# Patient Record
Sex: Female | Born: 1935 | Race: White | Hispanic: No | Marital: Single | Smoking: Former smoker
Health system: Southern US, Community
[De-identification: ages and names within clinical notes are randomized; demographics above are authoritative.]

## PROBLEM LIST (undated history)

## (undated) DIAGNOSIS — C50919 Malignant neoplasm of unspecified site of unspecified female breast: Secondary | ICD-10-CM

## (undated) DIAGNOSIS — I480 Paroxysmal atrial fibrillation: Secondary | ICD-10-CM

## (undated) DIAGNOSIS — S72141A Displaced intertrochanteric fracture of right femur, initial encounter for closed fracture: Secondary | ICD-10-CM

## (undated) DIAGNOSIS — H25019 Cortical age-related cataract, unspecified eye: Secondary | ICD-10-CM

## (undated) DIAGNOSIS — I10 Essential (primary) hypertension: Secondary | ICD-10-CM

## (undated) DIAGNOSIS — Z973 Presence of spectacles and contact lenses: Secondary | ICD-10-CM

## (undated) DIAGNOSIS — M810 Age-related osteoporosis without current pathological fracture: Secondary | ICD-10-CM

## (undated) HISTORY — PX: SIGMOIDOSCOPY: SUR1295

## (undated) HISTORY — PX: DILATION AND CURETTAGE OF UTERUS: SHX78

## (undated) HISTORY — PX: APPENDECTOMY: SHX54

## (undated) HISTORY — PX: TONSILLECTOMY: SUR1361

---

## 1979-09-13 HISTORY — PX: MASTECTOMY: SHX3

## 1998-04-14 ENCOUNTER — Other Ambulatory Visit: Admission: RE | Admit: 1998-04-14 | Discharge: 1998-04-14 | Payer: Self-pay | Admitting: Obstetrics & Gynecology

## 1998-04-15 ENCOUNTER — Other Ambulatory Visit: Admission: RE | Admit: 1998-04-15 | Discharge: 1998-04-15 | Payer: Self-pay | Admitting: Obstetrics & Gynecology

## 1999-04-19 ENCOUNTER — Other Ambulatory Visit: Admission: RE | Admit: 1999-04-19 | Discharge: 1999-04-19 | Payer: Self-pay | Admitting: Obstetrics & Gynecology

## 2000-05-02 ENCOUNTER — Other Ambulatory Visit: Admission: RE | Admit: 2000-05-02 | Discharge: 2000-05-02 | Payer: Self-pay | Admitting: Obstetrics & Gynecology

## 2001-05-09 ENCOUNTER — Other Ambulatory Visit: Admission: RE | Admit: 2001-05-09 | Discharge: 2001-05-09 | Payer: Self-pay | Admitting: Obstetrics & Gynecology

## 2004-09-12 HISTORY — PX: BREAST LUMPECTOMY WITH AXILLARY LYMPH NODE BIOPSY: SHX5593

## 2005-02-26 ENCOUNTER — Encounter: Admission: RE | Admit: 2005-02-26 | Discharge: 2005-02-26 | Payer: Self-pay | Admitting: General Surgery

## 2005-03-03 ENCOUNTER — Encounter: Admission: RE | Admit: 2005-03-03 | Discharge: 2005-03-03 | Payer: Self-pay | Admitting: General Surgery

## 2005-03-07 ENCOUNTER — Ambulatory Visit (HOSPITAL_BASED_OUTPATIENT_CLINIC_OR_DEPARTMENT_OTHER): Admission: RE | Admit: 2005-03-07 | Discharge: 2005-03-08 | Payer: Self-pay | Admitting: General Surgery

## 2005-03-07 ENCOUNTER — Ambulatory Visit (HOSPITAL_COMMUNITY): Admission: RE | Admit: 2005-03-07 | Discharge: 2005-03-07 | Payer: Self-pay | Admitting: General Surgery

## 2005-03-07 ENCOUNTER — Encounter (INDEPENDENT_AMBULATORY_CARE_PROVIDER_SITE_OTHER): Payer: Self-pay | Admitting: *Deleted

## 2005-03-16 ENCOUNTER — Ambulatory Visit: Payer: Self-pay | Admitting: Oncology

## 2005-04-04 ENCOUNTER — Ambulatory Visit: Admission: RE | Admit: 2005-04-04 | Discharge: 2005-06-10 | Payer: Self-pay | Admitting: Radiation Oncology

## 2005-04-06 ENCOUNTER — Ambulatory Visit (HOSPITAL_COMMUNITY): Admission: RE | Admit: 2005-04-06 | Discharge: 2005-04-06 | Payer: Self-pay | Admitting: Oncology

## 2005-06-03 ENCOUNTER — Ambulatory Visit: Payer: Self-pay | Admitting: Oncology

## 2005-07-22 ENCOUNTER — Ambulatory Visit: Payer: Self-pay | Admitting: Oncology

## 2005-10-25 ENCOUNTER — Ambulatory Visit: Payer: Self-pay | Admitting: Oncology

## 2006-02-23 ENCOUNTER — Ambulatory Visit: Payer: Self-pay | Admitting: Oncology

## 2006-02-27 LAB — CBC WITH DIFFERENTIAL/PLATELET
BASO%: 2 % (ref 0.0–2.0)
Basophils Absolute: 0.1 10*3/uL (ref 0.0–0.1)
EOS%: 2.1 % (ref 0.0–7.0)
HGB: 12.5 g/dL (ref 11.6–15.9)
MCH: 32.5 pg (ref 26.0–34.0)
MCV: 92.7 fL (ref 81.0–101.0)
MONO%: 9.4 % (ref 0.0–13.0)
RBC: 3.85 10*6/uL (ref 3.70–5.32)
RDW: 12.5 % (ref 11.3–14.5)
lymph#: 1.6 10*3/uL (ref 0.9–3.3)

## 2006-02-27 LAB — COMPREHENSIVE METABOLIC PANEL
ALT: 10 U/L (ref 0–40)
AST: 18 U/L (ref 0–37)
Albumin: 3.6 g/dL (ref 3.5–5.2)
Alkaline Phosphatase: 75 U/L (ref 39–117)
Potassium: 3.7 mEq/L (ref 3.5–5.3)
Sodium: 129 mEq/L — ABNORMAL LOW (ref 135–145)
Total Bilirubin: 0.5 mg/dL (ref 0.3–1.2)
Total Protein: 6.1 g/dL (ref 6.0–8.3)

## 2006-02-27 LAB — CANCER ANTIGEN 27.29: CA 27.29: 18 U/mL (ref 0–39)

## 2006-08-17 ENCOUNTER — Ambulatory Visit: Payer: Self-pay | Admitting: Oncology

## 2006-08-21 LAB — CBC WITH DIFFERENTIAL/PLATELET
Basophils Absolute: 0.1 10*3/uL (ref 0.0–0.1)
Eosinophils Absolute: 0.2 10*3/uL (ref 0.0–0.5)
HGB: 12.5 g/dL (ref 11.6–15.9)
LYMPH%: 22 % (ref 14.0–48.0)
MCV: 93.3 fL (ref 81.0–101.0)
MONO%: 10.6 % (ref 0.0–13.0)
NEUT#: 3.9 10*3/uL (ref 1.5–6.5)
Platelets: 322 10*3/uL (ref 145–400)
RBC: 3.9 10*6/uL (ref 3.70–5.32)

## 2006-08-21 LAB — COMPREHENSIVE METABOLIC PANEL
Alkaline Phosphatase: 76 U/L (ref 39–117)
BUN: 15 mg/dL (ref 6–23)
Glucose, Bld: 79 mg/dL (ref 70–99)
Total Bilirubin: 0.5 mg/dL (ref 0.3–1.2)

## 2006-08-21 LAB — CANCER ANTIGEN 27.29: CA 27.29: 22 U/mL (ref 0–39)

## 2007-02-21 ENCOUNTER — Ambulatory Visit: Payer: Self-pay | Admitting: Oncology

## 2007-02-23 LAB — CBC WITH DIFFERENTIAL/PLATELET
BASO%: 0.6 % (ref 0.0–2.0)
HCT: 36.4 % (ref 34.8–46.6)
LYMPH%: 19.6 % (ref 14.0–48.0)
MCHC: 35.1 g/dL (ref 32.0–36.0)
MCV: 92.8 fL (ref 81.0–101.0)
MONO#: 0.6 10*3/uL (ref 0.1–0.9)
MONO%: 9.5 % (ref 0.0–13.0)
NEUT%: 68.4 % (ref 39.6–76.8)
Platelets: 227 10*3/uL (ref 145–400)
RBC: 3.93 10*6/uL (ref 3.70–5.32)
WBC: 6.1 10*3/uL (ref 3.9–10.0)

## 2007-02-23 LAB — COMPREHENSIVE METABOLIC PANEL
ALT: 11 U/L (ref 0–35)
BUN: 14 mg/dL (ref 6–23)
CO2: 26 mEq/L (ref 19–32)
Calcium: 8.8 mg/dL (ref 8.4–10.5)
Creatinine, Ser: 0.76 mg/dL (ref 0.40–1.20)
Total Bilirubin: 0.5 mg/dL (ref 0.3–1.2)

## 2007-02-23 LAB — LACTATE DEHYDROGENASE: LDH: 163 U/L (ref 94–250)

## 2007-08-22 ENCOUNTER — Ambulatory Visit: Payer: Self-pay | Admitting: Oncology

## 2007-08-24 LAB — COMPREHENSIVE METABOLIC PANEL
Albumin: 3.5 g/dL (ref 3.5–5.2)
Alkaline Phosphatase: 49 U/L (ref 39–117)
BUN: 18 mg/dL (ref 6–23)
CO2: 26 mEq/L (ref 19–32)
Calcium: 8.6 mg/dL (ref 8.4–10.5)
Chloride: 100 mEq/L (ref 96–112)
Glucose, Bld: 69 mg/dL — ABNORMAL LOW (ref 70–99)
Potassium: 3.9 mEq/L (ref 3.5–5.3)
Sodium: 137 mEq/L (ref 135–145)
Total Protein: 6.5 g/dL (ref 6.0–8.3)

## 2007-08-24 LAB — CBC WITH DIFFERENTIAL/PLATELET
Basophils Absolute: 0 10*3/uL (ref 0.0–0.1)
Eosinophils Absolute: 0.1 10*3/uL (ref 0.0–0.5)
HGB: 12.3 g/dL (ref 11.6–15.9)
MCV: 93.6 fL (ref 81.0–101.0)
MONO#: 0.5 10*3/uL (ref 0.1–0.9)
MONO%: 9.2 % (ref 0.0–13.0)
NEUT#: 3.7 10*3/uL (ref 1.5–6.5)
RBC: 3.77 10*6/uL (ref 3.70–5.32)
RDW: 12.2 % (ref 11.3–14.5)
WBC: 5.9 10*3/uL (ref 3.9–10.0)
lymph#: 1.5 10*3/uL (ref 0.9–3.3)

## 2007-08-28 LAB — VITAMIN D PNL(25-HYDRXY+1,25-DIHY)-BLD
Vit D, 1,25-Dihydroxy: 21 pg/mL (ref 6–62)
Vit D, 25-Hydroxy: 31 ng/mL (ref 30–89)

## 2008-02-19 ENCOUNTER — Ambulatory Visit: Payer: Self-pay | Admitting: Oncology

## 2008-02-22 LAB — CBC WITH DIFFERENTIAL/PLATELET
Basophils Absolute: 0 10*3/uL (ref 0.0–0.1)
Eosinophils Absolute: 0.1 10*3/uL (ref 0.0–0.5)
HCT: 37.2 % (ref 34.8–46.6)
HGB: 13 g/dL (ref 11.6–15.9)
MCV: 91.4 fL (ref 81.0–101.0)
MONO%: 9.8 % (ref 0.0–13.0)
NEUT#: 3.7 10*3/uL (ref 1.5–6.5)
NEUT%: 61.9 % (ref 39.6–76.8)
Platelets: 217 10*3/uL (ref 145–400)
RDW: 12.2 % (ref 11.3–14.5)

## 2008-02-25 LAB — COMPREHENSIVE METABOLIC PANEL
ALT: 12 U/L (ref 0–35)
AST: 18 U/L (ref 0–37)
Albumin: 3.6 g/dL (ref 3.5–5.2)
Creatinine, Ser: 0.74 mg/dL (ref 0.40–1.20)
Potassium: 4 mEq/L (ref 3.5–5.3)
Sodium: 137 mEq/L (ref 135–145)
Total Protein: 6.5 g/dL (ref 6.0–8.3)

## 2008-02-25 LAB — LACTATE DEHYDROGENASE: LDH: 167 U/L (ref 94–250)

## 2008-08-29 ENCOUNTER — Ambulatory Visit: Payer: Self-pay | Admitting: Oncology

## 2008-09-02 LAB — CBC WITH DIFFERENTIAL/PLATELET
BASO%: 0.6 % (ref 0.0–2.0)
Basophils Absolute: 0 10*3/uL (ref 0.0–0.1)
Eosinophils Absolute: 0.1 10*3/uL (ref 0.0–0.5)
MCH: 32.4 pg (ref 26.0–34.0)
MCHC: 34.6 g/dL (ref 32.0–36.0)
NEUT#: 4.2 10*3/uL (ref 1.5–6.5)
NEUT%: 66.6 % (ref 39.6–76.8)
RDW: 12.6 % (ref 11.3–14.5)
lymph#: 1.4 10*3/uL (ref 0.9–3.3)

## 2008-09-03 LAB — LACTATE DEHYDROGENASE: LDH: 158 U/L (ref 94–250)

## 2008-09-03 LAB — COMPREHENSIVE METABOLIC PANEL
BUN: 21 mg/dL (ref 6–23)
Chloride: 102 mEq/L (ref 96–112)
Creatinine, Ser: 0.86 mg/dL (ref 0.40–1.20)
Potassium: 4.1 mEq/L (ref 3.5–5.3)
Sodium: 139 mEq/L (ref 135–145)

## 2008-09-03 LAB — VITAMIN D 25 HYDROXY (VIT D DEFICIENCY, FRACTURES): Vit D, 25-Hydroxy: 59 ng/mL (ref 30–89)

## 2008-09-03 LAB — CANCER ANTIGEN 27.29: CA 27.29: 19 U/mL (ref 0–39)

## 2009-03-30 ENCOUNTER — Ambulatory Visit: Payer: Self-pay | Admitting: Oncology

## 2009-04-01 LAB — CBC WITH DIFFERENTIAL/PLATELET
BASO%: 0.7 % (ref 0.0–2.0)
Basophils Absolute: 0 10*3/uL (ref 0.0–0.1)
Eosinophils Absolute: 0.1 10*3/uL (ref 0.0–0.5)
MCH: 32.4 pg (ref 25.1–34.0)
MCHC: 34.7 g/dL (ref 31.5–36.0)
MONO#: 0.6 10*3/uL (ref 0.1–0.9)
MONO%: 8.5 % (ref 0.0–14.0)
Platelets: 226 10*3/uL (ref 145–400)
RBC: 4.07 10*6/uL (ref 3.70–5.45)
WBC: 7 10*3/uL (ref 3.9–10.3)

## 2009-04-02 LAB — COMPREHENSIVE METABOLIC PANEL
ALT: 12 U/L (ref 0–35)
Albumin: 3.5 g/dL (ref 3.5–5.2)
BUN: 15 mg/dL (ref 6–23)
Calcium: 8.8 mg/dL (ref 8.4–10.5)
Glucose, Bld: 98 mg/dL (ref 70–99)
Total Protein: 6.3 g/dL (ref 6.0–8.3)

## 2009-04-02 LAB — LACTATE DEHYDROGENASE: LDH: 167 U/L (ref 94–250)

## 2009-04-02 LAB — CANCER ANTIGEN 27.29: CA 27.29: 25 U/mL (ref 0–39)

## 2010-04-07 ENCOUNTER — Ambulatory Visit: Payer: Self-pay | Admitting: Oncology

## 2010-04-12 LAB — COMPREHENSIVE METABOLIC PANEL
ALT: 12 U/L (ref 0–35)
AST: 18 U/L (ref 0–37)
Alkaline Phosphatase: 77 U/L (ref 39–117)
BUN: 11 mg/dL (ref 6–23)
CO2: 24 mEq/L (ref 19–32)
Chloride: 96 mEq/L (ref 96–112)
Potassium: 3.9 mEq/L (ref 3.5–5.3)

## 2010-04-12 LAB — CBC WITH DIFFERENTIAL/PLATELET
Basophils Absolute: 0 10*3/uL (ref 0.0–0.1)
EOS%: 1.2 % (ref 0.0–7.0)
Eosinophils Absolute: 0.1 10*3/uL (ref 0.0–0.5)
MCHC: 34.9 g/dL (ref 31.5–36.0)
MCV: 93.1 fL (ref 79.5–101.0)
MONO#: 0.6 10*3/uL (ref 0.1–0.9)
NEUT%: 67.5 % (ref 38.4–76.8)
RBC: 4.1 10*6/uL (ref 3.70–5.45)
RDW: 12.8 % (ref 11.2–14.5)
WBC: 7.6 10*3/uL (ref 3.9–10.3)
lymph#: 1.7 10*3/uL (ref 0.9–3.3)

## 2010-10-04 ENCOUNTER — Emergency Department (HOSPITAL_COMMUNITY)
Admission: EM | Admit: 2010-10-04 | Discharge: 2010-10-04 | Payer: Self-pay | Source: Home / Self Care | Admitting: Emergency Medicine

## 2011-01-28 NOTE — Op Note (Signed)
[name], Madeline Booth               ACCOUNT NO.:  [id number]   MEDICAL RECORD NO.:  [id number]          PATIENT TYPE:  AMB   LOCATION:  DSC                          FACILITY:  [facility]   PHYSICIAN:  Rose Phi. Maple Hudson, M.D.   DATE OF BIRTH:  04/19/1920   DATE OF PROCEDURE:  03/07/2005  DATE OF DISCHARGE:                                 OPERATIVE REPORT   PREOPERATIVE DIAGNOSIS:  Stage I carcinoma of the left breast.   POSTOPERATIVE DIAGNOSIS:  Stage I carcinoma of the left breast.   OPERATION:  1.  Blue dye injection.  2.  Left partial mastectomy with needle localization and specimen mammogram.  3.  Left axillary sentinel lymph node biopsy.   SURGEON:  Rose Phi. Maple Hudson, M.D.   ANESTHESIA:  General.   OPERATIVE PROCEDURE:  Prior to coming to the operating room, she had a  localizing wire placed for the tumor at about the 3 o'clock position of her  left breast and 1 mCi of technetium sulfur colloid was injected  intradermally.   After suitable anesthesia was induced, the patient was placed in a supine  position and the arms extended on the arm board.  Five milliliters of a  mixture of 2 mL of methylene blue and 3 mL of injectable saline were  injected the subareolar breast tissue and the breast gently massaged for 3  minutes.   We then prepped and draped her.   A curved incision using the previously placed wire centered at about 3  o'clock position of the left breast was then outlined and the incision made,  the wire delivered into the incision, and a wide excision of the wire and  surrounding tissue was carried out.  Specimen was oriented for the  pathologist and submitted to the radiologist for a specimen mammogram.   While that was being done, a short transverse axillary incision was made  with dissection through the subcutaneous tissue to the clavipectoral fascia.   There, I followed a blue lymphatic into a blue and hot lymph node, which was  the sentinel node, and it was  removed.  There were no other blue, hot or  palpable nodes.   That was submitted to the pathologist and was negative.  In addition, the  specimen mammogram confirmed the removal of the lesion and it was centered  in the specimen.   Both incisions were then closed with 3-0 Vicryl and subcuticular  4-0  Monocryl and Steri-Strips.  Dressing was applied.  The patient was  transferred to the recovery room in satisfactory condition, having tolerated  the procedure well.       PRY/MEDQ  D:  03/07/2005  T:  03/08/2005  Job:  [id number]

## 2011-04-26 ENCOUNTER — Other Ambulatory Visit: Payer: Self-pay | Admitting: Oncology

## 2011-04-26 ENCOUNTER — Encounter (HOSPITAL_BASED_OUTPATIENT_CLINIC_OR_DEPARTMENT_OTHER): Payer: Medicare Other | Admitting: Oncology

## 2011-04-26 DIAGNOSIS — C50519 Malignant neoplasm of lower-outer quadrant of unspecified female breast: Secondary | ICD-10-CM

## 2011-04-26 LAB — CBC WITH DIFFERENTIAL/PLATELET
BASO%: 0.6 % (ref 0.0–2.0)
Eosinophils Absolute: 0.1 10*3/uL (ref 0.0–0.5)
HGB: 14 g/dL (ref 11.6–15.9)
MCH: 32.1 pg (ref 25.1–34.0)
MONO#: 0.6 10*3/uL (ref 0.1–0.9)
NEUT#: 4.6 10*3/uL (ref 1.5–6.5)
NEUT%: 60.9 % (ref 38.4–76.8)
Platelets: 218 10*3/uL (ref 145–400)
RBC: 4.34 10*6/uL (ref 3.70–5.45)
RDW: 13.1 % (ref 11.2–14.5)
WBC: 7.6 10*3/uL (ref 3.9–10.3)
lymph#: 2.2 10*3/uL (ref 0.9–3.3)

## 2011-04-26 LAB — COMPREHENSIVE METABOLIC PANEL
Albumin: 3.4 g/dL — ABNORMAL LOW (ref 3.5–5.2)
Alkaline Phosphatase: 88 U/L (ref 39–117)
Potassium: 3.9 mEq/L (ref 3.5–5.3)
Total Bilirubin: 0.3 mg/dL (ref 0.3–1.2)

## 2011-05-04 ENCOUNTER — Encounter: Payer: Medicare Other | Admitting: Oncology

## 2011-05-04 DIAGNOSIS — M810 Age-related osteoporosis without current pathological fracture: Secondary | ICD-10-CM

## 2011-05-04 DIAGNOSIS — Z7901 Long term (current) use of anticoagulants: Secondary | ICD-10-CM

## 2011-09-20 ENCOUNTER — Encounter: Payer: Self-pay | Admitting: Emergency Medicine

## 2011-09-20 ENCOUNTER — Emergency Department (HOSPITAL_COMMUNITY): Payer: Medicare Other

## 2011-09-20 ENCOUNTER — Inpatient Hospital Stay (HOSPITAL_COMMUNITY)
Admission: EM | Admit: 2011-09-20 | Discharge: 2011-09-26 | DRG: 481 | Disposition: A | Discharge status: Skilled Nursing Facility | Payer: Medicare Other | Attending: Internal Medicine | Admitting: Internal Medicine

## 2011-09-20 ENCOUNTER — Other Ambulatory Visit: Payer: Self-pay

## 2011-09-20 DIAGNOSIS — S72143A Displaced intertrochanteric fracture of unspecified femur, initial encounter for closed fracture: Principal | ICD-10-CM | POA: Diagnosis present

## 2011-09-20 DIAGNOSIS — I10 Essential (primary) hypertension: Secondary | ICD-10-CM | POA: Diagnosis present

## 2011-09-20 DIAGNOSIS — S72141A Displaced intertrochanteric fracture of right femur, initial encounter for closed fracture: Secondary | ICD-10-CM

## 2011-09-20 DIAGNOSIS — Y93H2 Activity, gardening and landscaping: Secondary | ICD-10-CM

## 2011-09-20 DIAGNOSIS — I4891 Unspecified atrial fibrillation: Secondary | ICD-10-CM | POA: Diagnosis present

## 2011-09-20 DIAGNOSIS — Z9010 Acquired absence of unspecified breast and nipple: Secondary | ICD-10-CM

## 2011-09-20 DIAGNOSIS — T45515A Adverse effect of anticoagulants, initial encounter: Secondary | ICD-10-CM | POA: Diagnosis present

## 2011-09-20 DIAGNOSIS — Z923 Personal history of irradiation: Secondary | ICD-10-CM

## 2011-09-20 DIAGNOSIS — D689 Coagulation defect, unspecified: Secondary | ICD-10-CM | POA: Diagnosis present

## 2011-09-20 DIAGNOSIS — Z7901 Long term (current) use of anticoagulants: Secondary | ICD-10-CM

## 2011-09-20 DIAGNOSIS — Y92009 Unspecified place in unspecified non-institutional (private) residence as the place of occurrence of the external cause: Secondary | ICD-10-CM

## 2011-09-20 DIAGNOSIS — Z853 Personal history of malignant neoplasm of breast: Secondary | ICD-10-CM

## 2011-09-20 DIAGNOSIS — E871 Hypo-osmolality and hyponatremia: Secondary | ICD-10-CM | POA: Diagnosis not present

## 2011-09-20 DIAGNOSIS — W010XXA Fall on same level from slipping, tripping and stumbling without subsequent striking against object, initial encounter: Secondary | ICD-10-CM | POA: Diagnosis present

## 2011-09-20 DIAGNOSIS — Y998 Other external cause status: Secondary | ICD-10-CM

## 2011-09-20 DIAGNOSIS — S72001A Fracture of unspecified part of neck of right femur, initial encounter for closed fracture: Secondary | ICD-10-CM

## 2011-09-20 DIAGNOSIS — Z79899 Other long term (current) drug therapy: Secondary | ICD-10-CM

## 2011-09-20 DIAGNOSIS — R791 Abnormal coagulation profile: Secondary | ICD-10-CM | POA: Diagnosis present

## 2011-09-20 HISTORY — DX: Displaced intertrochanteric fracture of right femur, initial encounter for closed fracture: S72.141A

## 2011-09-20 HISTORY — DX: Essential (primary) hypertension: I10

## 2011-09-20 LAB — CBC
MCHC: 34.7 g/dL (ref 30.0–36.0)
Platelets: 203 10*3/uL (ref 150–400)
RDW: 12.8 % (ref 11.5–15.5)

## 2011-09-20 LAB — DIFFERENTIAL
Basophils Absolute: 0 10*3/uL (ref 0.0–0.1)
Basophils Relative: 0 % (ref 0–1)
Monocytes Absolute: 0.6 10*3/uL (ref 0.1–1.0)
Neutro Abs: 7 10*3/uL (ref 1.7–7.7)
Neutrophils Relative %: 74 % (ref 43–77)

## 2011-09-20 LAB — BASIC METABOLIC PANEL
Chloride: 94 mEq/L — ABNORMAL LOW (ref 96–112)
Creatinine, Ser: 0.61 mg/dL (ref 0.50–1.10)
GFR calc Af Amer: >90 mL/min (ref >90)
Potassium: 3.5 mEq/L (ref 3.5–5.1)
Sodium: 132 mEq/L — ABNORMAL LOW (ref 135–145)

## 2011-09-20 LAB — PROTIME-INR: Prothrombin Time: 23.7 seconds — ABNORMAL HIGH (ref 11.6–15.2)

## 2011-09-20 MED ORDER — SODIUM CHLORIDE 0.9 % IV SOLN
Freq: Once | INTRAVENOUS | Status: AC
  Administered 2011-09-21: via INTRAVENOUS

## 2011-09-20 NOTE — ED Notes (Signed)
All belongings except glasses given to friend Roanna Raider [phone]

## 2011-09-20 NOTE — ED Notes (Signed)
ZOX:[id number]<AV> Expected date:09/20/11<BR> Expected time: 9:43 PM<BR> Means of arrival:Ambulance<BR> Comments:<BR> EMS 241 GC - hip pain/fall

## 2011-09-20 NOTE — ED Provider Notes (Signed)
History     CSN: [id number]  Arrival date & time 09/20/11  2143   First MD Initiated Contact with Patient 09/20/11 2149      Chief Complaint  Patient presents with  . Hip Pain  . Fall    (Consider location/radiation/quality/duration/timing/severity/associated sxs/prior treatment) HPI Comments: Patient presented with fall that was mechanical at home.  She was getting up to play the organ and slipped she did not have loss of consciousness.  She has right hip pain.  She was unable to get herself up off the floor and presented by EMS with a friend who was able to be reached.  She has no family in the area.  Patient denies any other pain and has no chest pain, shortness of breath, nausea or vomiting or abdominal pain.  Patient does note that she is on Coumadin although it is unclear why the patient is on Coumadin.  Patient is to the pain is worsened by moving and is better when Still.  She does not want pain medications at this point in time.  Patient is a 78 y.o. female presenting with hip pain and fall. The history is provided by the patient. No language interpreter was used.  Hip Pain This is a new problem. The current episode started less than 1 hour ago. The problem occurs constantly. The problem has not changed since onset.Pertinent negatives include no chest pain, no abdominal pain, no headaches and no shortness of breath. Exacerbated by: moving. The symptoms are relieved by nothing. She has tried nothing for the symptoms.  Fall Pertinent negatives include no fever, no abdominal pain, no nausea, no vomiting and no headaches.    No past medical history on file.  No past surgical history on file.  No family history on file.  History  Substance Use Topics  . Smoking status: Not on file  . Smokeless tobacco: Not on file  . Alcohol Use: Not on file    OB History    No data available      Review of Systems  Constitutional: Negative.  Negative for fever and chills.  HENT:  Negative.   Eyes: Negative.  Negative for discharge and redness.  Respiratory: Negative.  Negative for cough and shortness of breath.   Cardiovascular: Negative.  Negative for chest pain.  Gastrointestinal: Negative.  Negative for nausea, vomiting, abdominal pain and diarrhea.  Genitourinary: Negative.  Negative for dysuria and vaginal discharge.  Musculoskeletal: Negative for back pain.  Skin: Negative.  Negative for color change and rash.  Neurological: Negative.  Negative for syncope and headaches.  Hematological: Negative.  Negative for adenopathy.  Psychiatric/Behavioral: Negative.  Negative for confusion.  All other systems reviewed and are negative.    Allergies  Review of patient's allergies indicates no known allergies.  Home Medications   Current Outpatient Rx  Name Route Sig Dispense Refill  . ALENDRONATE SODIUM 70 MG PO TABS Oral Take 70 mg by mouth every 7 (seven) days. Takes every Sunday.Marland KitchenMarland KitchenTake with a full glass of water on an empty stomach.     . AMLODIPINE BESYLATE 5 MG PO TABS Oral Take 5 mg by mouth daily.      Marland Kitchen METOPROLOL SUCCINATE ER 100 MG PO TB24 Oral Take 100 mg by mouth daily.      Marland Kitchen OLMESARTAN MEDOXOMIL-HCTZ 40-25 MG PO TABS Oral Take 1 tablet by mouth daily.      . WARFARIN SODIUM 2.5 MG PO TABS Oral Take 2.5 mg by mouth daily.  There were no vitals taken for this visit.  Physical Exam  Constitutional: She is oriented to person, place, and time. She appears well-developed and well-nourished.  Non-toxic appearance. She does not have a sickly appearance.  HENT:  Head: Normocephalic and atraumatic.  Eyes: Conjunctivae, EOM and lids are normal. Pupils are equal, round, and reactive to light. No scleral icterus.  Neck: Trachea normal and normal range of motion. Neck supple.  Cardiovascular: Normal rate, regular rhythm and normal heart sounds.   Pulmonary/Chest: Effort normal and breath sounds normal. No respiratory distress. She has no wheezes. She  has no rales.  Abdominal: Soft. Normal appearance. There is no tenderness. There is no rebound, no guarding and no CVA tenderness.  Musculoskeletal: Normal range of motion.       Right hip pain on palpation and log roll of leg.  No knee or ankle pain.  Patient's right leg does appear shorter than the left.  Neurological: She is alert and oriented to person, place, and time. She has normal strength.  Skin: Skin is warm, dry and intact. No rash noted.  Psychiatric: She has a normal mood and affect. Her behavior is normal. Judgment and thought content normal.    ED Course  Procedures (including critical care time) Results for orders placed during the hospital encounter of 09/20/11  CBC      Component Value Range   WBC 9.4  4.0 - 10.5 (K/uL)   RBC 4.23  3.87 - 5.11 (MIL/uL)   Hemoglobin 13.4  12.0 - 15.0 (g/dL)   HCT 78.2  95.6 - 21.3 (%)   MCV 91.3  78.0 - 100.0 (fL)   MCH 31.7  26.0 - 34.0 (pg)   MCHC 34.7  30.0 - 36.0 (g/dL)   RDW 08.6  57.8 - 46.9 (%)   Platelets 203  150 - 400 (K/uL)  DIFFERENTIAL      Component Value Range   Neutrophils Relative 74  43 - 77 (%)   Neutro Abs 7.0  1.7 - 7.7 (K/uL)   Lymphocytes Relative 17  12 - 46 (%)   Lymphs Abs 1.6  0.7 - 4.0 (K/uL)   Monocytes Relative 7  3 - 12 (%)   Monocytes Absolute 0.6  0.1 - 1.0 (K/uL)   Eosinophils Relative 1  0 - 5 (%)   Eosinophils Absolute 0.1  0.0 - 0.7 (K/uL)   Basophils Relative 0  0 - 1 (%)   Basophils Absolute 0.0  0.0 - 0.1 (K/uL)  BASIC METABOLIC PANEL      Component Value Range   Sodium 132 (*) 135 - 145 (mEq/L)   Potassium 3.5  3.5 - 5.1 (mEq/L)   Chloride 94 (*) 96 - 112 (mEq/L)   CO2 27  19 - 32 (mEq/L)   Glucose, Bld 119 (*) 70 - 99 (mg/dL)   BUN 18  6 - 23 (mg/dL)   Creatinine, Ser 6.29  0.50 - 1.10 (mg/dL)   Calcium 8.6  8.4 - 52.8 (mg/dL)   GFR calc non Af Amer 78 (*) >90 (mL/min)   GFR calc Af Amer >90  >90 (mL/min)  PROTIME-INR      Component Value Range   Prothrombin Time 23.7 (*) 11.6  - 15.2 (seconds)   INR 2.07 (*) 0.00 - 1.49   APTT      Component Value Range   aPTT 38 (*) 24 - 37 (seconds)   Dg Chest 1 View  09/20/2011  *RADIOLOGY REPORT*  Clinical Data: Fall, right  hip pain  CHEST - 1 VIEW  Comparison:  Findings: Right mastectomy.  Mildly enlarged heart silhouette. Ectatic aorta.  No effusion, infiltrate, or pneumothorax.  No acute bony abnormality.  IMPRESSION: No acute cardiopulmonary findings.  Mild cardiomegaly.  Original Report Authenticated By: Genevive Bi, M.D.   Dg Hip Complete Right  09/20/2011  *RADIOLOGY REPORT*  Clinical Data: Right hip pain the  RIGHT HIP - COMPLETE 2+ VIEW  Comparison: None.  Findings: There is a intertrochanteric fracture of the right hip with varus angulation. Hip is located. No pelvic fracture.  IMPRESSION: Right intertrochanteric femur fracture.  Original Report Authenticated By: Genevive Bi, M.D.       Date: 09/20/2011  Rate: 83  Rhythm: normal sinus rhythm  QRS Axis: normal  Intervals: normal  ST/T Wave abnormalities: normal  Conduction Disutrbances:first-degree A-V block   Narrative Interpretation:   Old EKG Reviewed: unchanged from 03/03/2005    MDM  Patient with mechanical fall with resultant right hip fracture.  On further discussion with the patient it is still unclear why the patient is on Coumadin.  She has never had an MI, stroke or diagnosed atrial fibrillation that I can determine from her history or medical record review.  Patient is still declining any pain medication at this time.  I have discussed this patient with Dr. Eulah Pont from orthopedics and he will evaluate the patient in the morning but is requesting the patient be admitted to the [facility] given the patient's on Coumadin with an elevated INR.  He is requested Buck's traction for the patient which I have ordered.  Patient is also to be kept n.p.o. which I've also already ordered for the patient.       Nat Christen, MD 09/20/11  [phone]

## 2011-09-20 NOTE — ED Notes (Signed)
Pt presented to ed via ems accompanied by friend, s/p fall c/o rt hip pain

## 2011-09-21 ENCOUNTER — Inpatient Hospital Stay (HOSPITAL_COMMUNITY): Payer: Medicare Other | Admitting: Anesthesiology

## 2011-09-21 ENCOUNTER — Encounter (HOSPITAL_COMMUNITY): Payer: Self-pay | Admitting: Anesthesiology

## 2011-09-21 ENCOUNTER — Inpatient Hospital Stay (HOSPITAL_COMMUNITY): Payer: Medicare Other

## 2011-09-21 ENCOUNTER — Encounter (HOSPITAL_COMMUNITY)
Admission: EM | Disposition: A | Discharge status: Skilled Nursing Facility | Payer: Self-pay | Source: Home / Self Care | Attending: Internal Medicine

## 2011-09-21 ENCOUNTER — Encounter (HOSPITAL_COMMUNITY): Payer: Self-pay | Admitting: *Deleted

## 2011-09-21 ENCOUNTER — Other Ambulatory Visit: Payer: Self-pay | Admitting: Orthopedic Surgery

## 2011-09-21 DIAGNOSIS — I10 Essential (primary) hypertension: Secondary | ICD-10-CM | POA: Diagnosis present

## 2011-09-21 DIAGNOSIS — D689 Coagulation defect, unspecified: Secondary | ICD-10-CM | POA: Diagnosis present

## 2011-09-21 DIAGNOSIS — I4891 Unspecified atrial fibrillation: Secondary | ICD-10-CM | POA: Diagnosis present

## 2011-09-21 DIAGNOSIS — S72141A Displaced intertrochanteric fracture of right femur, initial encounter for closed fracture: Secondary | ICD-10-CM

## 2011-09-21 HISTORY — DX: Displaced intertrochanteric fracture of right femur, initial encounter for closed fracture: S72.141A

## 2011-09-21 HISTORY — PX: FEMUR IM NAIL: SHX1597

## 2011-09-21 LAB — PREPARE FRESH FROZEN PLASMA: Unit division: 0

## 2011-09-21 LAB — TYPE AND SCREEN: Unit division: 0

## 2011-09-21 LAB — PREPARE RBC (CROSSMATCH)

## 2011-09-21 LAB — CBC
MCH: 31.2 pg (ref 26.0–34.0)
MCHC: 34.4 g/dL (ref 30.0–36.0)
MCV: 90.7 fL (ref 78.0–100.0)
Platelets: 207 10*3/uL (ref 150–400)
RDW: 12.7 % (ref 11.5–15.5)

## 2011-09-21 LAB — BASIC METABOLIC PANEL
BUN: 15 mg/dL (ref 6–23)
Chloride: 98 mEq/L (ref 96–112)
Creatinine, Ser: 0.51 mg/dL (ref 0.50–1.10)
GFR calc Af Amer: >90 mL/min (ref >90)
GFR calc non Af Amer: 83 mL/min — ABNORMAL LOW (ref >90)

## 2011-09-21 LAB — PROTIME-INR
Prothrombin Time: 24.7 seconds — ABNORMAL HIGH (ref 11.6–15.2)
Prothrombin Time: 21.9 seconds — ABNORMAL HIGH (ref 11.6–15.2)

## 2011-09-21 LAB — ABO/RH: ABO/RH(D): O POS

## 2011-09-21 SURGERY — INSERTION, INTRAMEDULLARY ROD, FEMUR
Anesthesia: General | Site: Hip | Laterality: Right

## 2011-09-21 MED ORDER — FENTANYL CITRATE 0.05 MG/ML IJ SOLN
INTRAMUSCULAR | Status: DC | PRN
  Administered 2011-09-21 (×2): 25 ug via INTRAVENOUS
  Administered 2011-09-21: 50 ug via INTRAVENOUS

## 2011-09-21 MED ORDER — METOCLOPRAMIDE HCL 10 MG PO TABS: 5.0000 mg | ORAL_TABLET | Freq: Three times a day (TID) | ORAL | Status: DC | PRN

## 2011-09-21 MED ORDER — ACETAMINOPHEN 325 MG PO TABS: 650.0000 mg | ORAL_TABLET | Freq: Four times a day (QID) | ORAL | Status: DC | PRN

## 2011-09-21 MED ORDER — OLMESARTAN MEDOXOMIL 40 MG PO TABS
40.0000 mg | ORAL_TABLET | Freq: Every day | ORAL | Status: DC
  Administered 2011-09-21 – 2011-09-26 (×6): 40 mg via ORAL
  Filled 2011-09-21 (×6): qty 1

## 2011-09-21 MED ORDER — ACETAMINOPHEN 650 MG RE SUPP: 650.0000 mg | Freq: Four times a day (QID) | RECTAL | Status: DC | PRN

## 2011-09-21 MED ORDER — METHOCARBAMOL 500 MG PO TABS: 500.0000 mg | ORAL_TABLET | Freq: Four times a day (QID) | ORAL | Status: DC | PRN

## 2011-09-21 MED ORDER — MENTHOL 3 MG MT LOZG: 1.0000 | LOZENGE | OROMUCOSAL | Status: DC | PRN

## 2011-09-21 MED ORDER — MORPHINE SULFATE 2 MG/ML IJ SOLN: 0.5000 mg | INTRAMUSCULAR | Status: DC | PRN

## 2011-09-21 MED ORDER — SENNOSIDES-DOCUSATE SODIUM 8.6-50 MG PO TABS
1.0000 | ORAL_TABLET | Freq: Every evening | ORAL | Status: DC | PRN
  Administered 2011-09-25: 1 via ORAL
  Filled 2011-09-21 (×2): qty 1

## 2011-09-21 MED ORDER — ONDANSETRON HCL 4 MG/2ML IJ SOLN: 4.0000 mg | Freq: Four times a day (QID) | INTRAMUSCULAR | Status: DC | PRN

## 2011-09-21 MED ORDER — ALENDRONATE SODIUM 70 MG PO TABS: 70.0000 mg | ORAL_TABLET | ORAL | Status: DC

## 2011-09-21 MED ORDER — METOPROLOL SUCCINATE ER 100 MG PO TB24
100.0000 mg | ORAL_TABLET | Freq: Every day | ORAL | Status: DC
  Administered 2011-09-21 – 2011-09-22 (×2): 100 mg via ORAL
  Filled 2011-09-21 (×3): qty 1

## 2011-09-21 MED ORDER — POTASSIUM CHLORIDE IN NACL 20-0.45 MEQ/L-% IV SOLN
INTRAVENOUS | Status: DC
  Administered 2011-09-22: via INTRAVENOUS
  Filled 2011-09-21 (×2): qty 1000

## 2011-09-21 MED ORDER — CALCIUM CARBONATE-VITAMIN D 500-200 MG-UNIT PO TABS: 1.0000 | ORAL_TABLET | Freq: Every day | ORAL | Status: DC

## 2011-09-21 MED ORDER — LACTATED RINGERS IV SOLN
INTRAVENOUS | Status: DC | PRN
  Administered 2011-09-21: 20:00:00 via INTRAVENOUS

## 2011-09-21 MED ORDER — HYDROCODONE-ACETAMINOPHEN 5-325 MG PO TABS: 1.0000 | ORAL_TABLET | Freq: Four times a day (QID) | ORAL | Status: DC | PRN

## 2011-09-21 MED ORDER — PROMETHAZINE HCL 25 MG/ML IJ SOLN: 6.2500 mg | INTRAMUSCULAR | Status: DC | PRN

## 2011-09-21 MED ORDER — LACTATED RINGERS IV SOLN
INTRAVENOUS | Status: DC
  Administered 2011-09-21: 22:00:00 via INTRAVENOUS

## 2011-09-21 MED ORDER — CEFAZOLIN SODIUM 1-5 GM-% IV SOLN
INTRAVENOUS | Status: DC | PRN
  Administered 2011-09-21: 1 g via INTRAVENOUS

## 2011-09-21 MED ORDER — AMLODIPINE BESYLATE 5 MG PO TABS
5.0000 mg | ORAL_TABLET | Freq: Every day | ORAL | Status: DC
  Administered 2011-09-21 – 2011-09-26 (×6): 5 mg via ORAL
  Filled 2011-09-21 (×6): qty 1

## 2011-09-21 MED ORDER — DEXTROSE-NACL 5-0.45 % IV SOLN
INTRAVENOUS | Status: DC
  Administered 2011-09-21: 06:00:00 via INTRAVENOUS

## 2011-09-21 MED ORDER — PHENOL 1.4 % MT LIQD: 1.0000 | OROMUCOSAL | Status: DC | PRN

## 2011-09-21 MED ORDER — ONDANSETRON HCL 4 MG/2ML IJ SOLN
INTRAMUSCULAR | Status: DC | PRN
  Administered 2011-09-21: 4 mg via INTRAVENOUS

## 2011-09-21 MED ORDER — HYDROCHLOROTHIAZIDE 25 MG PO TABS
25.0000 mg | ORAL_TABLET | Freq: Every day | ORAL | Status: DC
  Administered 2011-09-21 – 2011-09-23 (×3): 25 mg via ORAL
  Filled 2011-09-21 (×4): qty 1

## 2011-09-21 MED ORDER — HYDROCODONE-ACETAMINOPHEN 5-325 MG PO TABS
1.0000 | ORAL_TABLET | ORAL | Status: DC | PRN
  Administered 2011-09-22: 1 via ORAL
  Administered 2011-09-22 – 2011-09-23 (×2): 2 via ORAL
  Administered 2011-09-24: 1 via ORAL
  Filled 2011-09-21: qty 1
  Filled 2011-09-21 (×2): qty 2
  Filled 2011-09-21 (×2): qty 1

## 2011-09-21 MED ORDER — PROPOFOL 10 MG/ML IV BOLUS
INTRAVENOUS | Status: DC | PRN
  Administered 2011-09-21: 110 mg via INTRAVENOUS

## 2011-09-21 MED ORDER — METOCLOPRAMIDE HCL 5 MG/ML IJ SOLN: 5.0000 mg | Freq: Three times a day (TID) | INTRAMUSCULAR | Status: DC | PRN

## 2011-09-21 MED ORDER — CEFAZOLIN SODIUM 1-5 GM-% IV SOLN
1.0000 g | Freq: Four times a day (QID) | INTRAVENOUS | Status: AC
  Administered 2011-09-22 (×3): 1 g via INTRAVENOUS
  Filled 2011-09-21 (×3): qty 50

## 2011-09-21 MED ORDER — GLYCOPYRROLATE 0.2 MG/ML IJ SOLN
INTRAMUSCULAR | Status: DC | PRN
  Administered 2011-09-21: .6 mg via INTRAVENOUS

## 2011-09-21 MED ORDER — OLMESARTAN MEDOXOMIL-HCTZ 40-25 MG PO TABS: 1.0000 | ORAL_TABLET | Freq: Every day | ORAL | Status: DC

## 2011-09-21 MED ORDER — ROCURONIUM BROMIDE 100 MG/10ML IV SOLN
INTRAVENOUS | Status: DC | PRN
  Administered 2011-09-21: 25 mg via INTRAVENOUS

## 2011-09-21 MED ORDER — ZOLPIDEM TARTRATE 5 MG PO TABS: 5.0000 mg | ORAL_TABLET | Freq: Every evening | ORAL | Status: DC | PRN

## 2011-09-21 MED ORDER — METHOCARBAMOL 100 MG/ML IJ SOLN
500.0000 mg | Freq: Four times a day (QID) | INTRAVENOUS | Status: DC | PRN
  Filled 2011-09-21: qty 5

## 2011-09-21 MED ORDER — HYDROMORPHONE HCL PF 1 MG/ML IJ SOLN: 0.2500 mg | INTRAMUSCULAR | Status: DC | PRN

## 2011-09-21 MED ORDER — ONDANSETRON HCL 4 MG PO TABS: 4.0000 mg | ORAL_TABLET | Freq: Four times a day (QID) | ORAL | Status: DC | PRN

## 2011-09-21 MED ORDER — NEOSTIGMINE METHYLSULFATE 1 MG/ML IJ SOLN
INTRAMUSCULAR | Status: DC | PRN
  Administered 2011-09-21: 3 mg via INTRAVENOUS

## 2011-09-21 SURGICAL SUPPLY — 44 items
APL SKNCLS STERI-STRIP NONHPOA (GAUZE/BANDAGES/DRESSINGS) ×1
BAG SPEC THK2 15X12 ZIP CLS (MISCELLANEOUS) ×1
BAG ZIPLOCK 12X15 (MISCELLANEOUS) ×2 IMPLANT
BANDAGE GAUZE ELAST BULKY 4 IN (GAUZE/BANDAGES/DRESSINGS) ×2 IMPLANT
BENZOIN TINCTURE PRP APPL 2/3 (GAUZE/BANDAGES/DRESSINGS) ×2 IMPLANT
BLADE HELICAL 105 (Orthopedic Implant) ×1 IMPLANT
BLADE HEX COATED 2.75 (ELECTRODE) ×2 IMPLANT
BNDG COHESIVE 4X5 TAN STRL (GAUZE/BANDAGES/DRESSINGS) ×2 IMPLANT
CANISTER SUCTION 2500CC (MISCELLANEOUS) ×2 IMPLANT
CLOSURE STERI STRIP 1/2 X4 (GAUZE/BANDAGES/DRESSINGS) ×1 IMPLANT
CLOTH BEACON ORANGE TIMEOUT ST (SAFETY) ×2 IMPLANT
COVER SURGICAL LIGHT HANDLE (MISCELLANEOUS) ×2 IMPLANT
DRAPE STERI IOBAN 125X83 (DRAPES) ×2 IMPLANT
DRSG MEPILEX BORDER 4X4 (GAUZE/BANDAGES/DRESSINGS) ×1 IMPLANT
DURAPREP 26ML APPLICATOR (WOUND CARE) ×2 IMPLANT
ELECT REM PT RETURN 9FT ADLT (ELECTROSURGICAL) ×2
ELECTRODE REM PT RTRN 9FT ADLT (ELECTROSURGICAL) ×1 IMPLANT
EVACUATOR 1/8 PVC DRAIN (DRAIN) IMPLANT
GAUZE XEROFORM 5X9 LF (GAUZE/BANDAGES/DRESSINGS) ×2 IMPLANT
GLOVE BIOGEL PI IND STRL 8 (GLOVE) ×1 IMPLANT
GLOVE BIOGEL PI INDICATOR 8 (GLOVE) ×1
GLOVE ECLIPSE 7.5 STRL STRAW (GLOVE) ×2 IMPLANT
GLOVE INDICATOR 8.0 STRL GRN (GLOVE) ×2 IMPLANT
GLOVE ORTHO TXT STRL SZ7.5 (GLOVE) ×4 IMPLANT
GLOVE SURG ORTHO 8.0 STRL STRW (GLOVE) ×2 IMPLANT
GOWN STRL NON-REIN LRG LVL3 (GOWN DISPOSABLE) ×2 IMPLANT
GUIDEWIRE 3.2X400 (WIRE) ×1 IMPLANT
KIT BASIN OR (CUSTOM PROCEDURE TRAY) ×2 IMPLANT
NAIL TROCHANTERIC 11MM 130D (Nail) ×1 IMPLANT
NS IRRIG 1000ML POUR BTL (IV SOLUTION) ×2 IMPLANT
PACK GENERAL/GYN (CUSTOM PROCEDURE TRAY) ×2 IMPLANT
PADDING CAST COTTON 6X4 STRL (CAST SUPPLIES) ×2 IMPLANT
POSITIONER SURGICAL ARM (MISCELLANEOUS) ×6 IMPLANT
REAMER ROD DEEP FLUTE 2.5X950 (INSTRUMENTS) ×1 IMPLANT
SPONGE GAUZE 4X4 12PLY (GAUZE/BANDAGES/DRESSINGS) ×2 IMPLANT
STAPLER VISISTAT 35W (STAPLE) IMPLANT
STRIP CLOSURE SKIN 1/2X4 (GAUZE/BANDAGES/DRESSINGS) ×2 IMPLANT
SUT MNCRL AB 4-0 PS2 18 (SUTURE) ×2 IMPLANT
SUT PDS AB 0 CT1 36 (SUTURE) ×2 IMPLANT
SUT VIC AB 0 CT1 27 (SUTURE)
SUT VIC AB 0 CT1 27XBRD ANTBC (SUTURE) IMPLANT
SUT VIC AB 3-0 SH 8-18 (SUTURE) ×2 IMPLANT
TOWEL OR 17X26 10 PK STRL BLUE (TOWEL DISPOSABLE) ×4 IMPLANT
WATER STERILE IRR 1500ML POUR (IV SOLUTION) ×2 IMPLANT

## 2011-09-21 NOTE — Anesthesia Postprocedure Evaluation (Signed)
  Anesthesia Post-op Note  Patient: Madeline Booth  Procedure(s) Performed:  INTRAMEDULLARY (IM) NAIL FEMORAL  Patient Location: PACU  Anesthesia Type: General  Level of Consciousness: oriented and sedated  Airway and Oxygen Therapy: Patient Spontanous Breathing and Patient connected to nasal cannula oxygen  Post-op Pain: mild  Post-op Assessment: Post-op Vital signs reviewed, Patient's Cardiovascular Status Stable, Respiratory Function Stable and Patent Airway  Post-op Vital Signs: stable  Complications: No apparent anesthesia complications

## 2011-09-21 NOTE — ED Notes (Signed)
Primary MD at bedside.  Awaiting bed placement.

## 2011-09-21 NOTE — Anesthesia Preprocedure Evaluation (Signed)
Anesthesia Evaluation  Patient identified by MRN, date of birth, ID band Patient awake  General Assessment Comment:NPO today Advanced years  Reviewed: Allergy & Precautions, H&P , NPO status , Patient's Chart, lab work & pertinent test results, reviewed documented beta blocker date and time   Airway Mallampati: II TM Distance: >3 FB Neck ROM: Full    Dental  (+) Teeth Intact   Pulmonary neg pulmonary ROS,          Cardiovascular hypertension, Pt. on medications Regular Normal+ Systolic murmurs Denies cardiac symptoms   Neuro/Psych Negative Neurological ROS  Negative Psych ROS   GI/Hepatic negative GI ROS, Neg liver ROS,   Endo/Other  Negative Endocrine ROS  Renal/GU negative Renal ROS  Genitourinary negative   Musculoskeletal negative musculoskeletal ROS (+)   Abdominal   Peds negative pediatric ROS (+)  Hematology negative hematology ROS (+)   Anesthesia Other Findings Upper right bridge  Reproductive/Obstetrics negative OB ROS                           Anesthesia Physical Anesthesia Plan  ASA: II  Anesthesia Plan: General   Post-op Pain Management:    Induction: Intravenous  Airway Management Planned: Oral ETT  Additional Equipment:   Intra-op Plan:   Post-operative Plan: Extubation in OR  Informed Consent: I have reviewed the patients History and Physical, chart, labs and discussed the procedure including the risks, benefits and alternatives for the proposed anesthesia with the patient or authorized representative who has indicated his/her understanding and acceptance.     Plan Discussed with: CRNA and Surgeon  Anesthesia Plan Comments:         Anesthesia Quick Evaluation

## 2011-09-21 NOTE — ED Notes (Signed)
Still pending admit.  Pt comfortable at this time.

## 2011-09-21 NOTE — ED Notes (Signed)
Type/Cross 2 units FFP lab sent.

## 2011-09-21 NOTE — Progress Notes (Signed)
DAILY PROGRESS NOTE                              [facility]AL [facility]  SUBJECTIVE: Pain is controlled. Seen this morning by orthopedic PA waiting for decision for surgery.  OBJECTIVE: BP 154/74  Pulse 80  Temp(Src) 98.9 F (37.2 C) (Oral)  Resp 16  Ht 5\' 4"  (1.626 m)  Wt 64.411 kg (142 lb)  BMI 24.37 kg/m2  SpO2 98%  Intake/Output Summary (Last 24 hours) at 09/21/11 1045 Last data filed at 09/21/11 0300  Gross per 24 hour  Intake      0 ml  Output    800 ml  Net   -800 ml                      Weight change:  Physical Exam: General: Alert and awake oriented x3 not in any acute distress. HEENT: anicteric sclera, pupils equal reactive to light and accommodation CVS: S1-S2 heard, no murmur rubs or gallops Chest: clear to auscultation bilaterally, no wheezing rales or rhonchi Abdomen:  normal bowel sounds, soft, nontender, nondistended, no organomegaly Neuro: Cranial nerves II-XII intact, no focal neurological deficits Extremities: no cyanosis, no clubbing or edema noted bilaterally   Lab Results:  Basename 09/21/11 0535 09/20/11 2310  NA 134* 132*  K 3.4* 3.5  CL 98 94*  CO2 27 27  GLUCOSE 131* 119*  BUN 15 18  CREATININE 0.51 0.61  CALCIUM 7.7* 8.6  MG -- --  PHOS -- --   Basename 09/21/11 0535 09/20/11 2310  WBC 11.8* 9.4  NEUTROABS -- 7.0  HGB 11.2* 13.4  HCT 32.3* 38.6  MCV 90.7 91.3  PLT 207 203    Micro Results: No results found for this or any previous visit (from the past 240 hour(s)).  Studies/Results: Dg Chest 1 View  09/20/2011  *RADIOLOGY REPORT*  Clinical Data: Fall, right hip pain  CHEST - 1 VIEW  Comparison:  Findings: Right mastectomy.  Mildly enlarged heart silhouette. Ectatic aorta.  No effusion, infiltrate, or pneumothorax.  No acute bony abnormality.  IMPRESSION: No acute cardiopulmonary findings.  Mild cardiomegaly.  Original Report Authenticated By: Genevive Bi, M.D.   Dg Hip Complete Right  09/20/2011   *RADIOLOGY REPORT*  Clinical Data: Right hip pain the  RIGHT HIP - COMPLETE 2+ VIEW  Comparison: None.  Findings: There is a intertrochanteric fracture of the right hip with varus angulation. Hip is located. No pelvic fracture.  IMPRESSION: Right intertrochanteric femur fracture.  Original Report Authenticated By: Genevive Bi, M.D.   Medications: Scheduled Meds:   . sodium chloride   Intravenous Once  . amLODipine  5 mg Oral Daily  . hydrochlorothiazide  25 mg Oral Daily  . metoprolol  100 mg Oral Daily  . olmesartan  40 mg Oral Daily  . DISCONTD: alendronate  70 mg Oral Q7 days  . DISCONTD: olmesartan-hydrochlorothiazide  1 tablet Oral Daily   Continuous Infusions:   . dextrose 5 % and 0.45% NaCl 50 mL/hr at 09/21/11 0552   PRN Meds:.acetaminophen, acetaminophen, morphine, ondansetron (ZOFRAN) IV, ondansetron, senna-docusate  ASSESSMENT & PLAN: Principal Problem:  *Femur fracture Active Problems:  Coagulopathy  HTN (hypertension)  Atrial fibrillation  1. Intertrochanteric femoral fracture: Patient to be evaluated by Dr. Dion Saucier. For possible evaluation for surgical intervention.  2. Coagulopathy: Patient is on Coumadin for atrial fibrillation, her INR this morning is 2.1. She is  off of the Coumadin for now. If surgery sometime today patient can get FFP.  3. Atrial fibrillation: Rate controlled. Patient on Coumadin off of it because of anticipated surgery.  4. HTN: This is being controlled, a medication restarted.   LOS: 1 day   Momin Misko A 09/21/2011, 10:45 AM

## 2011-09-21 NOTE — Op Note (Signed)
DATE OF SURGERY:  09/21/2011  TIME: 9:00 PM  PATIENT NAME:  Madeline Booth  AGE: 78 y.o.  PRE-OPERATIVE DIAGNOSIS:  right hip intertrochantaric fracture  POST-OPERATIVE DIAGNOSIS:  SAME  PROCEDURE:  INTRAMEDULLARY (IM) NAIL FEMORAL  SURGEON:  Demeshia Sherburne P  ASSISTANT:  Janace Litten, OPA-C, present and scrubbed throughout the case, critical for assistance with exposure, retraction, instrumentation, and closure.  OPERATIVE IMPLANTS: Synthes trochanteric femoral nail with interlocking helical blade into the femoral head.  Specimens: Proximal femoral reamings, history of breast cancer  PREOPERATIVE INDICATIONS:  Madeline Booth is a 78 y.o. year old who fell and suffered a hip fracture. She was brought into the ER and then admitted and optimized and then elected for surgical intervention.    The risks benefits and alternatives were discussed with the patient including but not limited to the risks of nonoperative treatment, versus surgical intervention including infection, bleeding, nerve injury, malunion, nonunion, hardware prominence, hardware failure, need for hardware removal, blood clots, cardiopulmonary complications, morbidity, mortality, among others, and they were willing to proceed.  Predicted outcome is good, although there will be at least a six to nine month expected recovery.  I elected to use an intramedullary nail because of the history of breast cancer, and the potential for this being a metastatic lesion.  OPERATIVE PROCEDURE:  The patient was brought to the operating room and placed in the supine position. General anesthesia was administered, with a foley. She was placed on the fracture table.  Closed reduction was performed under C-arm guidance. The length of the femur was also measured using fluoroscopy. Time out was then performed after sterile prep and drape. She received preoperative antibiotics.  Incision was made proximal to the greater trochanter. A guidewire  was placed in the appropriate position. Confirmation was made on AP and lateral views. The above-named nail was opened. I opened the proximal femur with a reamer.  I did need to ream, because the nail did not go down by hand. The reamings were sent to pathology for analysis given her history of breast cancer.  Once the nail was completely seated, I placed a guidepin into the femoral head into the center center position. I measured the length, and then reamed the lateral cortex and up into the head. I then placed the helical blade. Slight compression was applied. Anatomic fixation achieved. Bone quality was mediocre.  I then secured the proximal interlocking bolt, and took off a half a turn, and then removed the instruments, and took final C-arm pictures AP and lateral the entire length of the leg. Anatomic reconstruction was achieved, and the wounds were irrigated copiously and closed with Vicryl followed by Steri-Strips and sterile gauze for the skin. The patient was awakened and returned to PACU in stable and satisfactory condition. There no complications and she tolerated the procedure well.  She be weightbearing as tolerated, and will be on Lovenox bridging to Coumadin.    Teryl Lucy, M.D.

## 2011-09-21 NOTE — Consult Note (Signed)
ORTHOPAEDIC CONSULTATION  REQUESTING PHYSICIAN: Clint Lipps  Chief Complaint: Right hip pain  HPI: Madeline Booth is a 78 y.o. female who complains of  right hip pain that began after a mechanical fall. This occurred yesterday. She reports pain as moderate to severe. She is unable to walk. She is brought in the emergency room and admitted and has been on pain medication and using Buck's traction. She is normally independently living, and able to ambulate without difficulty. She has never had an injury like this before. She is on Coumadin for the treatment of intermittent atrial fibrillation, although she says that she's been tested for this and did not demonstrate atrial fibrillation, however Dr. Leslie Dales capture on her Coumadin. She had no other injuries in the fall.  Past Medical History  Diagnosis Date  . Hypertension    History reviewed. No pertinent past surgical history. History   Social History  . Marital Status: Single    Spouse Name: N/A    Number of Children: N/A  . Years of Education: N/A   Social History Main Topics  . Smoking status: Never Smoker   . Smokeless tobacco: None  . Alcohol Use:   . Drug Use:   . Sexually Active:    Other Topics Concern  . None   Social History Narrative  . None   History reviewed. No pertinent family history. No Known Allergies Prior to Admission medications   Medication Sig Start Date End Date Taking? Authorizing Provider  alendronate (FOSAMAX) 70 MG tablet Take 70 mg by mouth every 7 (seven) days. Takes every Sunday.Marland KitchenMarland KitchenTake with a full glass of water on an empty stomach.    Yes Historical Provider, MD  amLODipine (NORVASC) 5 MG tablet Take 5 mg by mouth daily.     Yes Historical Provider, MD  metoprolol (TOPROL-XL) 100 MG 24 hr tablet Take 100 mg by mouth daily.     Yes Historical Provider, MD  olmesartan-hydrochlorothiazide (BENICAR HCT) 40-25 MG per tablet Take 1 tablet by mouth daily.     Yes Historical Provider, MD    warfarin (COUMADIN) 2.5 MG tablet Take 2.5 mg by mouth daily.     Yes Historical Provider, MD   Dg Chest 1 View  09/20/2011  *RADIOLOGY REPORT*  Clinical Data: Fall, right hip pain  CHEST - 1 VIEW  Comparison:  Findings: Right mastectomy.  Mildly enlarged heart silhouette. Ectatic aorta.  No effusion, infiltrate, or pneumothorax.  No acute bony abnormality.  IMPRESSION: No acute cardiopulmonary findings.  Mild cardiomegaly.  Original Report Authenticated By: Genevive Bi, M.D.   Dg Hip Complete Right  09/20/2011  *RADIOLOGY REPORT*  Clinical Data: Right hip pain the  RIGHT HIP - COMPLETE 2+ VIEW  Comparison: None.  Findings: There is a intertrochanteric fracture of the right hip with varus angulation. Hip is located. No pelvic fracture.  IMPRESSION: Right intertrochanteric femur fracture.  Original Report Authenticated By: Genevive Bi, M.D.     Positive ROS: All other systems have been reviewed and were otherwise negative with the exception of those mentioned in the HPI and as above.  Physical Exam: General: Alert, no acute distress Cardiovascular: No pedal edema Respiratory: No cyanosis, no use of accessory musculature GI: No organomegaly, abdomen is soft and non-tender Skin: No lesions in the area of chief complaint Neurologic: Sensation intact distally Psychiatric: Patient is competent for consent with normal mood and affect Lymphatic: No axillary or cervical lymphadenopathy  MUSCULOSKELETAL: Right hip is shortened and externally rotated. She  has pain to palpation with positive log roll proximally. EHL and FHL are firing.  Assessment: Right intertrochanteric hip fracture, on anticoagulation for what sounds like intermittent atrial fibrillation  Plan: This is an acute severe injury that limits her capacity to function independently and threatened her long-term ability. We discussed the options including nonsurgical versus surgical intervention, and I recommended surgical  management.   The risks benefits and alternatives were discussed with the patient including but not limited to the risks of nonoperative treatment, versus surgical intervention including infection, bleeding, nerve injury, malunion, nonunion, hardware prominence, hardware failure, need for hardware removal, blood clots, cardiopulmonary complications, morbidity, mortality, among others, and they were willing to proceed.  Predicted outcome is good, although there will be at least a six to nine month expected recovery.  We will plan for surgical intervention later on this evening.   Philip Kotlyar P 09/21/2011 2:13 PM

## 2011-09-21 NOTE — Progress Notes (Signed)
Received report from TCU nurse.

## 2011-09-21 NOTE — H&P (Addendum)
Late entry due to Epic downtime - seen and examined at approximately 12:15 AM with all admission orders accounted for around that time.  Primary Care Physician: Casimiro Needle Altheimer MD    Chief Complaint: Fall  History of Present Illness: Patient is a 78 year old woman history of hypertension and on Coumadin possibly for remote history of syncope who presents status post mechanical fall. Was gardening and trying to put on her shoe when she fell backwards. Denies any loss of consciousness or head trauma. No presyncopal symptoms. Called EMS for further assistance.   In the ED, temperature 97.9 with blood pressure 157/66, heart rate 81, respirations 18, satting 99% on room air. Right hip radiographs demonstrating a right femur intertrochanteric fracture. INR 2.07. Ortho was consulted, to see the patient in the morning. Recommended admission to Medical service to assist with medical management.  Past Medical/Surgical History: Hypertension Remote history of syncope for which PCP reportedly placed on Coumadin. Remote history of local breast cancer status post mastectomy and radiation. No known metastatic diease. Status post lumpectomy.  Allergies   Review of patient's allergies indicates no known allergies.  Home Medications    Current Outpatient Rx   Name  Route  Sig  Dispense  Refill   .  ALENDRONATE SODIUM 70 MG PO TABS  Oral  Take 70 mg by mouth every 7 (seven) days. Takes every Sunday.Marland KitchenMarland KitchenTake with a full glass of water on an empty stomach.     .  AMLODIPINE BESYLATE 5 MG PO TABS  Oral  Take 5 mg by mouth daily.     Marland Kitchen  METOPROLOL SUCCINATE ER 100 MG PO TB24  Oral  Take 100 mg by mouth daily.     Marland Kitchen  OLMESARTAN MEDOXOMIL-HCTZ 40-25 MG PO TABS  Oral  Take 1 tablet by mouth daily.     .  WARFARIN SODIUM 2.5 MG PO TABS  Oral  Take 2.5 mg by mouth daily.       Family History: No history of MIs  Social History: Single, lives alone Denies any tobacco, EtOH, or iliicits  Review of  Systems: General: No fevers, chills, sweats, night sweats, weight loss Skin: No rashes or lacerations HEENT: No rhinorrhea, sore throat, dry mouth, hearing difficulties Pulmonary: No cough, wheezing, shortness of breath Cardivascular: No chest pain, dyspnea on exertion, palpitations, lightheaded/dizziness, paroxysmal nocturnal dyspnea, orthopnea Gastrointestinal: No abdominal pain, dysphagia, odynophagia, nausea, vomiting, hematemesis, melena, hematochezia, bowel changes Genitourinary: No dysuria, hematuria, increased urinary frequency/urgency. No discharge Musculoskeletal: As per HPI Hematologic: No easy bruising or bleeding Neurologic: No headaches, vision changes, focal neurologic deficits Psychologic: No suicidial or homicidal ideation. No depression  Filed Vitals:   09/20/11 2202 09/20/11 2314 09/21/11 0233 09/21/11 0456  BP: 157/66 170/86 140/57 154/74  Pulse: 81 79 74 80  Temp: 97.9 F (36.6 C) 97.9 F (36.6 C) 98.2 F (36.8 C) 98.9 F (37.2 C)  TempSrc: Oral Oral Oral Oral  Resp: 18 16 16 16   Height: 5\' 4"  (1.626 m)   5\' 4"  (1.626 m)  Weight: 64.411 kg (142 lb)   64.411 kg (142 lb)  SpO2: 99% 100% 98% 98%    Physical Exam: General: Alert and oriented x 3, no apparent distress Skin: No rashes, bruises HEENT: Head atraumatic, sclera anicertic, pupils equal and reactive to light, oropharynx moist with tonsils unremarkable Neck: Soft, no lymphadenopathy, thyromegaly, or bruits Chest: Clear to auscultation bilaterally, no wheezes, rales, or ronchi Heart: Regular rate and rhythm, normal S1/S2 no rubs, gallops, or  murmurs Abdomen: Soft, nontender, nondistended, + bowel sounds, no masses Extremities: Pain over the right hip area with movement. No obvious open injury. No cyanosis, clubbing, or edema. 2+ radial and dorsalis pedis pulses bilaterally Neurologic: Grossly intact   Labs: CBC    Component Value Date/Time   WBC 11.8* 09/21/2011 0535   WBC 7.6 04/26/2011 1552   RBC  3.56* 09/21/2011 0535   RBC 4.34 04/26/2011 1552   HGB 11.2* 09/21/2011 0535   HGB 14.0 04/26/2011 1552   HCT 32.3* 09/21/2011 0535   HCT 40.4 04/26/2011 1552   PLT 207 09/21/2011 0535   PLT 218 04/26/2011 1552   MCV 90.7 09/21/2011 0535   MCV 93.0 04/26/2011 1552   MCH 31.2 09/21/2011 0535   MCH 32.1 04/26/2011 1552   MCHC 34.4 09/21/2011 0535   MCHC 34.5 04/26/2011 1552   RDW 12.7 09/21/2011 0535   RDW 13.1 04/26/2011 1552   LYMPHSABS 1.6 09/20/2011 2310   LYMPHSABS 2.2 04/26/2011 1552   MONOABS 0.6 09/20/2011 2310   MONOABS 0.6 04/26/2011 1552   EOSABS 0.1 09/20/2011 2310   EOSABS 0.1 04/26/2011 1552   BASOSABS 0.0 09/20/2011 2310   BASOSABS 0.0 04/26/2011 1552    BMET    Component Value Date/Time   NA 134* 09/21/2011 0535   K 3.4* 09/21/2011 0535   CL 98 09/21/2011 0535   CO2 27 09/21/2011 0535   GLUCOSE 131* 09/21/2011 0535   BUN 15 09/21/2011 0535   CREATININE 0.51 09/21/2011 0535   CALCIUM 7.7* 09/21/2011 0535   GFRNONAA 83* 09/21/2011 0535   GFRAA >90 09/21/2011 0535    INR 2.07  Right hip radiographs: IMPRESSION:  Right intertrochanteric femur fracture.  Chest radiograph: IMPRESSION:  No acute cardiopulmonary findings. Mild cardiomegaly.  EKG (my interpretation): Sinus rhythm with possible right atrial type p-wave abnormality. No ischemic changes.  Impression/Plan: 78 year old woman history of hypertension and on Coumadin possibly for remote history of syncope who presents status post mechanical fall found to have an angulated intertrochanteric fracture, currently afebrile and hemodynamically stable although INR therapeutic.  Right intertrochanteric femur fracture: - Secondary to mechanical fall - Admit to Medicine - Pain control as needed - Continue Alendronate - Ortho to see patient in the morning  Therapeutic anticoagulation: Of unclear reasoning. EKG sinus rhythm. I discussed this with the patient's PCP at time of admission, and given that he currently cannot think of any reason to continue  right now, will reverse with 2 units of FFP for procedure - Recheck INR in AM  Hypertension: Blood pressure elevated, likely due to pain - Continue home medications  Fluid/electrolytes/nutrition: - Hep-locked IV - Monitor electrolytes daily - NPO for now  Prophylaxis: - INR therapeutic  CODE STATUS: Full code

## 2011-09-21 NOTE — Progress Notes (Signed)

## 2011-09-21 NOTE — Transfer of Care (Signed)
Immediate Anesthesia Transfer of Care Note  Patient: Madeline Booth  Procedure(s) Performed:  INTRAMEDULLARY (IM) NAIL FEMORAL  Patient Location: PACU  Anesthesia Type: General  Level of Consciousness: sedated, patient cooperative and responds to stimulaton  Airway & Oxygen Therapy: Patient Spontanous Breathing and Patient connected to face mask oxgen  Post-op Assessment: Report given to PACU RN and Post -op Vital signs reviewed and stable  Post vital signs: Reviewed and stable  Complications: No apparent anesthesia complications

## 2011-09-22 ENCOUNTER — Encounter (HOSPITAL_COMMUNITY): Payer: Self-pay | Admitting: Orthopedic Surgery

## 2011-09-22 LAB — CBC
Hemoglobin: 10.4 g/dL — ABNORMAL LOW (ref 12.0–15.0)
MCH: 31.4 pg (ref 26.0–34.0)
MCHC: 34.4 g/dL (ref 30.0–36.0)
RDW: 12.8 % (ref 11.5–15.5)

## 2011-09-22 LAB — BASIC METABOLIC PANEL
BUN: 12 mg/dL (ref 6–23)
Calcium: 7.8 mg/dL — ABNORMAL LOW (ref 8.4–10.5)
Creatinine, Ser: 0.6 mg/dL (ref 0.50–1.10)
GFR calc Af Amer: >90 mL/min (ref >90)
GFR calc non Af Amer: 78 mL/min — ABNORMAL LOW (ref >90)
Glucose, Bld: 127 mg/dL — ABNORMAL HIGH (ref 70–99)
Potassium: 3.7 mEq/L (ref 3.5–5.1)

## 2011-09-22 LAB — PROTIME-INR: Prothrombin Time: 21.6 seconds — ABNORMAL HIGH (ref 11.6–15.2)

## 2011-09-22 MED ORDER — SODIUM CHLORIDE 0.9 % IV BOLUS (SEPSIS)
500.0000 mL | Freq: Once | INTRAVENOUS | Status: AC
  Administered 2011-09-22: 500 mL via INTRAVENOUS

## 2011-09-22 MED ORDER — ENOXAPARIN SODIUM 40 MG/0.4ML ~~LOC~~ SOLN
40.0000 mg | SUBCUTANEOUS | Status: DC
  Filled 2011-09-22: qty 0.4

## 2011-09-22 MED ORDER — SODIUM CHLORIDE 0.9 % IV SOLN
INTRAVENOUS | Status: DC
  Administered 2011-09-22 – 2011-09-26 (×6): via INTRAVENOUS
  Filled 2011-09-22 (×14): qty 1000

## 2011-09-22 MED ORDER — POLYETHYLENE GLYCOL 3350 17 G PO PACK
17.0000 g | PACK | Freq: Every day | ORAL | Status: DC
  Administered 2011-09-22 – 2011-09-26 (×5): 17 g via ORAL
  Filled 2011-09-22 (×5): qty 1

## 2011-09-22 MED ORDER — WARFARIN SODIUM 2.5 MG PO TABS
2.5000 mg | ORAL_TABLET | Freq: Once | ORAL | Status: AC
  Administered 2011-09-22: 2.5 mg via ORAL
  Filled 2011-09-22: qty 1

## 2011-09-22 NOTE — Progress Notes (Addendum)
ANTICOAGULATION CONSULT NOTE - Initial Consult  Pharmacy Consult for Warfarin Indication: atrial fibrillation (per MD note 1/10-syncope noted on H&P)  No Known Allergies  Patient Measurements: Height: 5\' 4"  (162.6 cm) Weight: 142 lb (64.411 kg) IBW/kg (Calculated) : 54.7   Vital Signs: Temp: 98.3 F (36.8 C) (01/10 1107) Temp src: Oral (01/10 1107) BP: 91/51 mmHg (01/10 1110) Pulse Rate: 75  (01/10 1110)  Labs:  Basename 09/22/11 0320 09/21/11 1718 09/21/11 0535 09/20/11 2312 09/20/11 2310  HGB 10.4* -- 11.2* -- --  HCT 30.2* -- 32.3* -- 38.6  PLT 182 -- 207 -- 203  APTT -- -- -- 38* --  LABPROT 21.6* 21.9* 24.7* -- --  INR 1.84* 1.88* 2.19* -- --  HEPARINUNFRC -- -- -- -- --  CREATININE 0.60 -- 0.51 -- 0.61  CKTOTAL -- -- -- -- --  CKMB -- -- -- -- --  TROPONINI -- -- -- -- --   Estimated Creatinine Clearance: 41.2 ml/min (by C-G formula based on Cr of 0.6).  Medical History: Past Medical History  Diagnosis Date  . Hypertension   . Intertrochanteric fracture of right femur 09/21/2011    Medications:  Prescriptions prior to admission  Medication Sig Dispense Refill  . alendronate (FOSAMAX) 70 MG tablet Take 70 mg by mouth every 7 (seven) days. Takes every Sunday.Marland KitchenMarland KitchenTake with a full glass of water on an empty stomach.       Marland Kitchen amLODipine (NORVASC) 5 MG tablet Take 5 mg by mouth daily.        . metoprolol (TOPROL-XL) 100 MG 24 hr tablet Take 100 mg by mouth daily.        Marland Kitchen olmesartan-hydrochlorothiazide (BENICAR HCT) 40-25 MG per tablet Take 1 tablet by mouth daily.        Marland Kitchen warfarin (COUMADIN) 2.5 MG tablet Take 2.5 mg by mouth daily.         Scheduled:    . amLODipine  5 mg Oral Daily  . ceFAZolin (ANCEF) IV  1 g Intravenous Q6H  . enoxaparin  40 mg Subcutaneous Q24H  . hydrochlorothiazide  25 mg Oral Daily  . metoprolol  100 mg Oral Daily  . olmesartan  40 mg Oral Daily  . polyethylene glycol  17 g Oral Daily  . sodium chloride  500 mL Intravenous Once     Assessment: 78 yo female s/p ORIF R hip fracture. Chronic Coumadin : home dose 2.5mg  daily Lovenox 40mg  SQ daily, d/c when INR back into therapeutic range. INR 1.84 today  Goal of Therapy:  INR 2-3   Plan:  Coumadin 2.5mg  today, Daily protimes.  Chilton Si, Teagen Bucio L 09/22/2011,11:22 AM   Addendum:  Plan discontinue Lovenox when INR >/= 1.8. INR today 1.84, Lovenox discontinued.  Otho Bellows PharmD Pager [phone] 09/22/2011 11:35 AM

## 2011-09-22 NOTE — Progress Notes (Signed)
OT Cancellation Note  ___Treatment cancelled today due to medical issues with patient which prohibited therapy  ___ Treatment cancelled today due to patient receiving procedure or test   _x__ Treatment cancelled today due to patient's refusal to participate. Pt stated she was fatigued from initial PT session. Noted BP drop. Will check back another day.   ___ Treatment cancelled today due to   Signature: Garrel Ridgel, OTR/L  Pager [phone] 09/22/2011

## 2011-09-22 NOTE — Progress Notes (Signed)
Physical Therapy Treatment Patient Details Name: Madeline Booth MRN: [id number] DOB: 10-17-1919 Today's Date: 09/22/2011  PT Assessment/Plan  PT - Assessment/Plan Comments on Treatment Session: pt was better able to tolerate upright this afternoon, but will still require extensive PT prior to D/C to home PT Plan: Discharge plan remains appropriate PT Frequency: Min 5X/week Recommendations for Other Services: OT consult Follow Up Recommendations: Skilled nursing facility Equipment Recommended: Defer to next venue PT Goals  Acute Rehab PT Goals PT Goal Formulation: With patient Time For Goal Achievement: 2 days Pt will go Supine/Side to Sit: with min assist PT Goal: Supine/Side to Sit - Progress: Not met Pt will go Sit to Stand: with min assist PT Goal: Sit to Stand - Progress: Not met Pt will Ambulate: 16 - 50 feet;with least restrictive assistive device PT Goal: Ambulate - Progress: Not met Pt will Perform Home Exercise Program: with min assist PT Goal: Perform Home Exercise Program - Progress: Not met  PT Treatment Precautions/Restrictions  Restrictions Weight Bearing Restrictions: Yes RLE Weight Bearing: Weight bearing as tolerated Mobility (including Balance) Bed Mobility Bed Mobility: Yes Supine to Sit: 1: +2 Total assist (pt ~50%) Sit to Supine: 1: +2 Total assist (pt <25%) Sit to Supine - Details (indicate cue type and reason): pt needed assit to guide shoulders to supine and lift legs up onto bed Transfers Transfers: Yes Sit to Stand: 1: +2 Total assist (pt ~50%) Sit to Stand Details (indicate cue type and reason): needs cues for arm placement, to push up, and to come to erect position, pt very dizzy upon standing  "I'm going to faint" Stand to Sit: 1: +2 Total assist (pt ~ 50%) Stand to Sit Details: pt needed assist to control descent.  Much better tolerance of upright this session Ambulation/Gait Ambulation/Gait: Yes Ambulation/Gait Assistance: 1: +2 Total  assist (pt ~50%) Ambulation/Gait Assistance Details (indicate cue type and reason): pt continues to have difficulty with weight shifting and stepping, she takes very small steps Ambulation Distance (Feet): 3 Feet Assistive device: Rolling walker Gait Pattern: Decreased step length - right;Decreased step length - left;Trunk flexed Gait velocity: slow Stairs: No Wheelchair Mobility Wheelchair Mobility: No    Exercise    End of Session PT - End of Session Equipment Utilized During Treatment:  (RW) Activity Tolerance: Treatment limited secondary to medical complications (Comment);Patient limited by pain Patient left: in bed Nurse Communication: Mobility status for transfers General Behavior During Session: [facility] for tasks performed Cognition: [facility] for tasks performed  Donnetta Hail 09/22/2011, 1:22 PM

## 2011-09-22 NOTE — Progress Notes (Signed)
CSW spoke with patient re: discharge plans. Noted PT recommending ST-SNF @ discharge. Patient states that her friend, Hazeline Junker (home:# [phone]) has heard good things about [facility]. CSW completed FL2 and faxed information to [facility], will check back tomorrow re: bed availability.   Unice Bailey, [place] [phone]  See shadow chart for yellow clinical social work assessment & placement note.

## 2011-09-22 NOTE — Progress Notes (Signed)
DAILY PROGRESS NOTE                              GENERAL INTERNAL MEDICINE [facility]  SUBJECTIVE: No complaints. Had her surgery yesterday.   OBJECTIVE: BP 135/73  Pulse 71  Temp(Src) 98.8 F (37.1 C) (Oral)  Resp 16  Ht 5\' 4"  (1.626 m)  Wt 64.411 kg (142 lb)  BMI 24.37 kg/m2  SpO2 99%  Intake/Output Summary (Last 24 hours) at 09/22/11 0858 Last data filed at 09/22/11 0400  Gross per 24 hour  Intake 2011.4 ml  Output    800 ml  Net 1211.4 ml                      Weight change:  Physical Exam: General: Alert and awake oriented x3 not in any acute distress. HEENT: anicteric sclera, pupils equal reactive to light and accommodation CVS: S1-S2 heard, no murmur rubs or gallops Chest: clear to auscultation bilaterally, no wheezing rales or rhonchi Abdomen:  normal bowel sounds, soft, nontender, nondistended, no organomegaly Neuro: Cranial nerves II-XII intact, no focal neurological deficits Extremities: no cyanosis, no clubbing or edema noted bilaterally   Lab Results:  Basename 09/22/11 0320 09/21/11 0535  NA 131* 134*  K 3.7 3.4*  CL 96 98  CO2 28 27  GLUCOSE 127* 131*  BUN 12 15  CREATININE 0.60 0.51  CALCIUM 7.8* 7.7*  MG -- --  PHOS -- --    Basename 09/22/11 0320 09/21/11 0535 09/20/11 2310  WBC 11.6* 11.8* --  NEUTROABS -- -- 7.0  HGB 10.4* 11.2* --  HCT 30.2* 32.3* --  MCV 91.2 90.7 --  PLT 182 207 --    Micro Results: No results found for this or any previous visit (from the past 240 hour(s)).  Studies/Results: Dg Chest 1 View  09/20/2011  *RADIOLOGY REPORT*  Clinical Data: Fall, right hip pain  CHEST - 1 VIEW  Comparison:  Findings: Right mastectomy.  Mildly enlarged heart silhouette. Ectatic aorta.  No effusion, infiltrate, or pneumothorax.  No acute bony abnormality.  IMPRESSION: No acute cardiopulmonary findings.  Mild cardiomegaly.  Original Report Authenticated By: Genevive Bi, M.D.   Dg Hip Complete Right  09/20/2011  *RADIOLOGY  REPORT*  Clinical Data: Right hip pain the  RIGHT HIP - COMPLETE 2+ VIEW  Comparison: None.  Findings: There is a intertrochanteric fracture of the right hip with varus angulation. Hip is located. No pelvic fracture.  IMPRESSION: Right intertrochanteric femur fracture.  Original Report Authenticated By: Genevive Bi, M.D.   Medications: Scheduled Meds:    . amLODipine  5 mg Oral Daily  . ceFAZolin (ANCEF) IV  1 g Intravenous Q6H  . hydrochlorothiazide  25 mg Oral Daily  . metoprolol  100 mg Oral Daily  . olmesartan  40 mg Oral Daily   Continuous Infusions:    . dextrose 5 % and 0.45% NaCl 50 mL/hr at 09/21/11 0552  . DISCONTD: 0.45 % NaCl with KCl 20 mEq / L 75 mL/hr at 09/22/11 0005  . DISCONTD: lactated ringers 999 mL/hr at 09/21/11 2138   PRN Meds:.acetaminophen, acetaminophen, acetaminophen, acetaminophen, HYDROcodone-acetaminophen, HYDROmorphone, menthol-cetylpyridinium, methocarbamol(ROBAXIN) IV, methocarbamol, metoCLOPramide (REGLAN) injection, metoCLOPramide, morphine, morphine, ondansetron (ZOFRAN) IV, ondansetron (ZOFRAN) IV, ondansetron, ondansetron, phenol, promethazine, senna-docusate, zolpidem  ASSESSMENT & PLAN: Principal Problem:  *Intertrochanteric fracture of right femur Active Problems:  Coagulopathy  HTN (hypertension)  Atrial fibrillation  1. Intertrochanteric femoral fracture: Status  post intramedullary nailing done by Dr. Dion Saucier yesterday. Waiting for PT OT evaluation. Patient states the pain is controlled.  2. Coagulopathy: Patient is on Coumadin for atrial fibrillation, INR is 1.8. Will restart the Coumadin.  3. Atrial fibrillation: Rate controlled. I will restart Coumadin.  4. HTN: This is being controlled, a medication restarted.   LOS: 2 days   Shamarra Warda A 09/22/2011, 8:58 AM

## 2011-09-22 NOTE — Progress Notes (Signed)
Subjective: 1 Day Post-Op Procedure(s) (LRB): INTRAMEDULLARY (IM) NAIL FEMORAL (Right) Patient reports pain as 4 on 0-10 scale and 5 on 0-10 scale.    Objective: Vital signs in last 24 hours: Temp:  [97.9 F (36.6 C)-99.3 F (37.4 C)] 98.8 F (37.1 C) (01/10 0450) Pulse Rate:  [63-76] 71  (01/10 0450) Resp:  [12-18] 16  (01/10 0450) BP: (127-155)/(58-80) 135/73 mmHg (01/10 0450) SpO2:  [96 %-100 %] 96 % (01/10 0450)  Intake/Output from previous day: 01/09 0701 - 01/10 0700 In: 2011.4 [I.V.:1961.4; IV Piggyback:50] Out: 1000 [Urine:900; Blood:100] Intake/Output this shift:     Basename 09/22/11 0320 09/21/11 0535 09/20/11 2310  HGB 10.4* 11.2* 13.4    Basename 09/22/11 0320 09/21/11 0535  WBC 11.6* 11.8*  RBC 3.31* 3.56*  HCT 30.2* 32.3*  PLT 182 207    Basename 09/22/11 0320 09/21/11 0535  NA 131* 134*  K 3.7 3.4*  CL 96 98  CO2 28 27  BUN 12 15  CREATININE 0.60 0.51  GLUCOSE 127* 131*  CALCIUM 7.8* 7.7*    Basename 09/22/11 0320 09/21/11 1718  LABPT -- --  INR 1.84* 1.88*    Sensation intact distally Intact pulses distally Dorsiflexion/Plantar flexion intact Incision: dressing C/D/I  Assessment/Plan: 1 Day Post-Op Procedure(s) (LRB): INTRAMEDULLARY (IM) NAIL FEMORAL (Right) Advance diet Up with therapy Will plan for SNF placement on Monday. Resume coumadin, bridging with lovenox.  Haskel Khan 09/22/2011, 7:27 AM

## 2011-09-22 NOTE — Progress Notes (Addendum)
Pt has only voided approximately 60 mL since arriving back from surgery around 22:30. Foley checked for leakage--none found.  Bladder scanner shows bladder volume = 335 mL. Pt states her BP medication is a diuretic, which she has not had yet this morning.

## 2011-09-22 NOTE — Progress Notes (Signed)
Physical Therapy Evaluation Patient Details Name: Madeline Booth MRN: [id number] DOB: Oct 07, 1919 Today's Date: 09/22/2011 10:50-11:15 EVII  Pt with orthostasis with intial OOB post op.  Anticipate her recovery may be slow and she will need ST-SNF for continued PT prior to return to home alone.  Problem List:  Patient Active Problem List  Diagnoses  . Intertrochanteric fracture of right femur  . Coagulopathy  . HTN (hypertension)  . Atrial fibrillation    Past Medical History:  Past Medical History  Diagnosis Date  . Hypertension   . Intertrochanteric fracture of right femur 09/21/2011   Past Surgical History:  Past Surgical History  Procedure Date  . Femur im nail 09/21/2011    Procedure: INTRAMEDULLARY (IM) NAIL FEMORAL;  Surgeon: Eulas Post;  Location: [facility];  Service: Orthopedics;  Laterality: Right;    PT Assessment/Plan/Recommendation PT Assessment Clinical Impression Statement: Pt with orthostasis in upright, pain and dependence with moblitity.  Anticipate she may have a prolonged recovery and need a short term SNF stay prior to return to home alone PT Recommendation/Assessment: Patient will need skilled PT in the acute care venue PT Problem List: Decreased strength;Decreased activity tolerance;Decreased mobility;Decreased knowledge of use of DME Barriers to Discharge: Decreased caregiver support PT Therapy Diagnosis : Difficulty walking;Abnormality of gait;Acute pain;Generalized weakness PT Plan PT Frequency: Min 5X/week PT Treatment/Interventions: DME instruction;Gait training;Functional mobility training;Therapeutic exercise;Patient/family education PT Recommendation Recommendations for Other Services: OT consult Follow Up Recommendations: Skilled nursing facility Equipment Recommended: Defer to next venue PT Goals  Acute Rehab PT Goals PT Goal Formulation: With patient Time For Goal Achievement: 2 weeks Pt will go Supine/Side to Sit: with min assist PT  Goal: Supine/Side to Sit - Progress: Not met Pt will go Sit to Stand: with min assist PT Goal: Sit to Stand - Progress: Not met Pt will Ambulate: 16 - 50 feet;with least restrictive assistive device PT Goal: Ambulate - Progress: Not met Pt will Perform Home Exercise Program: with min assist PT Goal: Perform Home Exercise Program - Progress: Not met  PT Evaluation Precautions/Restrictions  Restrictions Weight Bearing Restrictions: Yes RLE Weight Bearing: Weight bearing as tolerated Prior Functioning  Home Living Lives With: Alone Type of Home: House Home Layout: One level Home Access: Stairs to enter Entrance Stairs-Rails: None Entrance Stairs-Number of Steps: 3 Prior Function Level of Independence: Independent with homemaking with ambulation;Independent with gait (pt admits to 3 falls last year) Cognition Cognition Arousal/Alertness: Awake/alert Orientation Level: Oriented X4 Sensation/Coordination Sensation Light Touch: Appears Intact Stereognosis: Appears Intact Hot/Cold: Appears Intact Proprioception: Appears Intact Coordination Gross Motor Movements are Fluid and Coordinated: Yes Extremity Assessment RLE Assessment RLE Assessment: Exceptions to [facility] RLE AROM (degrees) RLE Overall AROM Comments:  pt with edema in right thigh, she is able to tolerate gentle ROM  of hip knee, and ankle, and perform active ankle ROM, quad sets amd short arc quads with assist LLE Assessment LLE Assessment: Within Functional Limits Mobility (including Balance) Bed Mobility Bed Mobility: Yes Supine to Sit: 1: +2 Total assist (pt ~50%) Transfers Transfers: Yes Sit to Stand: 1: +2 Total assist (pt ~ 50%) Sit to Stand Details (indicate cue type and reason): needs cues for arm placement, to push up, and to come to erect position, pt very dizzy upon standing  "I'm going to faint" Stand to Sit: 1: +2 Total assist Stand to Sit Details: pt < 50%  limited by  orthostasis Ambulation/Gait Ambulation/Gait: Yes Ambulation/Gait Assistance: 1: +2 Total assist (pt < 50%) Ambulation/Gait Assistance  Details (indicate cue type and reason): pt needs significant assist to stay erect, patient with diffculty  with weight shift and stepping, c/o pain and dizziness Ambulation Distance (Feet): 2 Feet Assistive device: Rolling walker Gait Pattern: Decreased step length - right;Decreased step length - left;Trunk flexed Gait velocity: slow Stairs: No Wheelchair Mobility Wheelchair Mobility: No    Exercise    End of Session PT - End of Session Equipment Utilized During Treatment:  (RW) Activity Tolerance: Treatment limited secondary to medical complications (Comment) (orthostasis) Patient left: in chair Nurse Communication: Mobility status for transfers (dizziness, decreased BP,RN assumed care) General Behavior During Session: [facility] for tasks performed Cognition: [facility] for tasks performed  Donnetta Hail 09/22/2011, 1:01 PM

## 2011-09-23 LAB — PROTIME-INR
INR: 1.76 — ABNORMAL HIGH (ref 0.00–1.49)
Prothrombin Time: 20.8 seconds — ABNORMAL HIGH (ref 11.6–15.2)

## 2011-09-23 LAB — BASIC METABOLIC PANEL
GFR calc Af Amer: >90 mL/min (ref >90)
GFR calc non Af Amer: 79 mL/min — ABNORMAL LOW (ref >90)
Potassium: 3.7 mEq/L (ref 3.5–5.1)
Sodium: 129 mEq/L — ABNORMAL LOW (ref 135–145)

## 2011-09-23 LAB — CBC
Hemoglobin: 9.1 g/dL — ABNORMAL LOW (ref 12.0–15.0)
Platelets: 158 10*3/uL (ref 150–400)
RBC: 2.89 MIL/uL — ABNORMAL LOW (ref 3.87–5.11)

## 2011-09-23 MED ORDER — METOPROLOL SUCCINATE ER 50 MG PO TB24
50.0000 mg | ORAL_TABLET | Freq: Every day | ORAL | Status: DC
  Administered 2011-09-24 – 2011-09-26 (×3): 50 mg via ORAL
  Filled 2011-09-23 (×3): qty 1

## 2011-09-23 MED ORDER — WARFARIN SODIUM 2.5 MG PO TABS
2.5000 mg | ORAL_TABLET | Freq: Once | ORAL | Status: AC
  Administered 2011-09-23: 2.5 mg via ORAL
  Filled 2011-09-23: qty 1

## 2011-09-23 NOTE — Progress Notes (Signed)
Physical Therapy Treatment Patient Details Name: Madeline Booth MRN: [id number] DOB: 08/29/1920 Today's Date: 09/23/2011 9:03-9:22 G  PT Assessment/Plan  PT - Assessment/Plan Equipment Recommended: Defer to next venue PT Goals  Acute Rehab PT Goals PT Goal: Supine/Side to Sit - Progress: Progressing toward goal PT Goal: Sit to Stand - Progress: Progressing toward goal PT Goal: Ambulate - Progress: Progressing toward goal  PT Treatment Precautions/Restrictions  Precautions Required Braces or Orthoses: No Restrictions Weight Bearing Restrictions: Yes RLE Weight Bearing: Weight bearing as tolerated Mobility (including Balance) Bed Mobility Bed Mobility: Yes Supine to Sit: 1: +2 Total assist (pt ~ 50 %) Supine to Sit Details (indicate cue type and reason):  pt needed cues to place left foot up to bridge up hips and move to edge of bed.  Sit to Supine: 1: +2 Total assist (pt  ~ 50%) Transfers Sit to Stand: 1: +2 Total assist (pt > 50%) Sit to Stand Details (indicate cue type and reason): pt limited by some pain in right leg with mobility, pt did have pain medicine prior to PT Stand to Sit: 1: +2 Total assist (pt > 50%.) Stand to Sit Details: pt had begun to experience some orthostasis in standing after taking a few steps Ambulation/Gait Ambulation/Gait: Yes Ambulation/Gait Assistance: 1: +2 Total assist (pt ~ 50%) Ambulation/Gait Assistance Details (indicate cue type and reason): pt had difficulty raising right leg up to take a step.  Pt c/o pain with this attempt.  She was able to take about 10 small steps, then began to fatigue Ambulation Distance (Feet): 5 Feet Assistive device: Rolling walker Gait Pattern: Step-to pattern;Decreased step length - right;Decreased stance time - right;Trunk flexed;Antalgic Gait velocity: slow Stairs: No    Exercise    End of Session PT - End of Session Equipment Utilized During Treatment: Gait belt (RW) Activity Tolerance: Treatment  limited secondary to medical complications (Comment);Patient limited by pain Nurse Communication: Mobility status for transfers;Mobility status for ambulation General Behavior During Session: [facility] for tasks performed Cognition: [facility] for tasks performed  Madeline Booth 09/23/2011, 10:57 AM

## 2011-09-23 NOTE — Progress Notes (Signed)
ANTICOAGULATION CONSULT NOTE - Follow Up Consult  Pharmacy Consult for Coumadin Indication: atrial fibrillation  No Known Allergies  Patient Measurements: Height: 5\' 4"  (162.6 cm) Weight: 142 lb (64.411 kg) IBW/kg (Calculated) : 54.7    Vital Signs: Temp: 99.1 F (37.3 C) (01/11 0455) Temp src: Oral (01/11 0455) BP: 102/60 mmHg (01/11 1125) Pulse Rate: 80  (01/11 0929)  Labs:  Basename 09/23/11 0335 09/22/11 0320 09/21/11 1718 09/21/11 0535 09/20/11 2312  HGB 9.1* 10.4* -- -- --  HCT 26.2* 30.2* -- 32.3* --  PLT 158 182 -- 207 --  APTT -- -- -- -- 38*  LABPROT 20.8* 21.6* 21.9* -- --  INR 1.76* 1.84* 1.88* -- --  HEPARINUNFRC -- -- -- -- --  CREATININE 0.59 0.60 -- 0.51 --  CKTOTAL -- -- -- -- --  CKMB -- -- -- -- --  TROPONINI -- -- -- -- --   Estimated Creatinine Clearance: 41.2 ml/min (by C-G formula based on Cr of 0.59).   Medications:  Scheduled:    . amLODipine  5 mg Oral Daily  . ceFAZolin (ANCEF) IV  1 g Intravenous Q6H  . hydrochlorothiazide  25 mg Oral Daily  . metoprolol succinate  50 mg Oral Daily  . olmesartan  40 mg Oral Daily  . polyethylene glycol  17 g Oral Daily  . warfarin  2.5 mg Oral ONCE-1800  . DISCONTD: metoprolol succinate  100 mg Oral Daily   Infusions:    . sodium chloride 0.9 % 1,000 mL with potassium chloride 20 mEq infusion 100 mL/hr at 09/23/11 1214   PRN: acetaminophen, acetaminophen, HYDROcodone-acetaminophen, menthol-cetylpyridinium, methocarbamol(ROBAXIN) IV, methocarbamol, morphine, ondansetron (ZOFRAN) IV, ondansetron, phenol, senna-docusate, zolpidem, DISCONTD: metoCLOPramide (REGLAN) injection, DISCONTD: metoCLOPramide, DISCONTD: morphine  Assessment: 78 yo F on chronic coumadin for hx Afib. S/P ORIF R femur (POD2). Coumadin resumed yesterday. INR down some. Was on Lovenox 40mg  daily, was d/c yesterday per order instructions to d/c when INR >  1.8. Coumadin dose PTA 2.5mg  daily. No bleeding reported. Noted high risk  falls, orthostatic hypotension.   Goal of Therapy:  INR 2-3   Plan:  Coumadin 2.5mg  today. F/U AM INR.  Gwen Her PharmD  [phone] 09/23/2011 12:49 PM

## 2011-09-23 NOTE — Progress Notes (Signed)
DAILY PROGRESS NOTE                              [facility]  SUBJECTIVE: Had mild dizziness this morning while she is getting her PT. Her blood pressure went down a little bit other than that no complaints.  OBJECTIVE: BP 96/58  Pulse 80  Temp(Src) 99.1 F (37.3 C) (Oral)  Resp 18  Ht 5\' 4"  (1.626 m)  Wt 64.411 kg (142 lb)  BMI 24.37 kg/m2  SpO2 93%  Intake/Output Summary (Last 24 hours) at 09/23/11 1116 Last data filed at 09/23/11 0300  Gross per 24 hour  Intake 493.75 ml  Output   1700 ml  Net -1206.25 ml                      Weight change:  Physical Exam: General: Alert and awake oriented x3 not in any acute distress. HEENT: anicteric sclera, pupils equal reactive to light and accommodation CVS: S1-S2 heard, no murmur rubs or gallops Chest: clear to auscultation bilaterally, no wheezing rales or rhonchi Abdomen:  normal bowel sounds, soft, nontender, nondistended, no organomegaly Neuro: Cranial nerves II-XII intact, no focal neurological deficits Extremities: no cyanosis, no clubbing or edema noted bilaterally   Lab Results:  Basename 09/23/11 0335 09/22/11 0320  NA 129* 131*  K 3.7 3.7  CL 98 96  CO2 25 28  GLUCOSE 97 127*  BUN 12 12  CREATININE 0.59 0.60  CALCIUM 7.4* 7.8*  MG -- --  PHOS -- --    Basename 09/23/11 0335 09/22/11 0320 09/20/11 2310  WBC 9.8 11.6* --  NEUTROABS -- -- 7.0  HGB 9.1* 10.4* --  HCT 26.2* 30.2* --  MCV 90.7 91.2 --  PLT 158 182 --    Micro Results: No results found for this or any previous visit (from the past 240 hour(s)).  Studies/Results: Dg Chest 1 View  09/20/2011  *RADIOLOGY REPORT*  Clinical Data: Fall, right hip pain  CHEST - 1 VIEW  Comparison:  Findings: Right mastectomy.  Mildly enlarged heart silhouette. Ectatic aorta.  No effusion, infiltrate, or pneumothorax.  No acute bony abnormality.  IMPRESSION: No acute cardiopulmonary findings.  Mild cardiomegaly.  Original Report  Authenticated By: Genevive Bi, M.D.   Dg Hip Complete Right  09/20/2011  *RADIOLOGY REPORT*  Clinical Data: Right hip pain the  RIGHT HIP - COMPLETE 2+ VIEW  Comparison: None.  Findings: There is a intertrochanteric fracture of the right hip with varus angulation. Hip is located. No pelvic fracture.  IMPRESSION: Right intertrochanteric femur fracture.  Original Report Authenticated By: Genevive Bi, M.D.   Medications: Scheduled Meds:    . amLODipine  5 mg Oral Daily  . ceFAZolin (ANCEF) IV  1 g Intravenous Q6H  . hydrochlorothiazide  25 mg Oral Daily  . metoprolol succinate  50 mg Oral Daily  . olmesartan  40 mg Oral Daily  . polyethylene glycol  17 g Oral Daily  . sodium chloride  500 mL Intravenous Once  . warfarin  2.5 mg Oral ONCE-1800  . DISCONTD: enoxaparin  40 mg Subcutaneous Q24H  . DISCONTD: metoprolol succinate  100 mg Oral Daily   Continuous Infusions:    . sodium chloride 0.9 % 1,000 mL with potassium chloride 20 mEq infusion 75 mL/hr at 09/22/11 2332   PRN Meds:.acetaminophen, acetaminophen, HYDROcodone-acetaminophen, menthol-cetylpyridinium, methocarbamol(ROBAXIN) IV, methocarbamol, morphine, ondansetron (ZOFRAN) IV, ondansetron, phenol, senna-docusate, zolpidem,  DISCONTD: metoCLOPramide (REGLAN) injection, DISCONTD: metoCLOPramide, DISCONTD: morphine  ASSESSMENT & PLAN: Principal Problem:  *Intertrochanteric fracture of right femur Active Problems:  Coagulopathy  HTN (hypertension)  Atrial fibrillation  1. Intertrochanteric femoral fracture: Status post intramedullary nailing done by Dr. Dion Saucier yesterday. PT OT recommended skilled nursing facility. Waiting for bed availability.  2. Coagulopathy: Patient is on Coumadin for atrial fibrillation, INR is 1.8. Will restart the Coumadin.  3. Atrial fibrillation: Rate controlled. I will restart Coumadin. I did decrease her beta blockers if heart rate went up I will put it back again to 100 and decrease in her the  amlodipine and the Benicar  4. HTN: This is being controlled, on medication restarted, patient had some orthostatic hypotension this morning while doing physical therapy not 100% sure it was secondary to narcotics were too much antihypertensive I did decrease the metoprolol to 50.   LOS: 3 days   Hermina Barnard A 09/23/2011, 11:16 AM

## 2011-09-23 NOTE — Progress Notes (Signed)
Occupational Therapy Evaluation Patient Details Name: Madeline Booth MRN: [id number] DOB: 1920/07/01 Today's Date: 09/23/2011  Problem List:  Patient Active Problem List  Diagnoses  . Intertrochanteric fracture of right femur  . Coagulopathy  . HTN (hypertension)  . Atrial fibrillation    Past Medical History:  Past Medical History  Diagnosis Date  . Hypertension   . Intertrochanteric fracture of right femur 09/21/2011   Past Surgical History:  Past Surgical History  Procedure Date  . Femur im nail 09/21/2011    Procedure: INTRAMEDULLARY (IM) NAIL FEMORAL;  Surgeon: Eulas Post;  Location: [facility];  Service: Orthopedics;  Laterality: Right;    OT Assessment/Plan/Recommendation OT Assessment Clinical Impression Statement: Pt s/p R femur fracture with deficits in areas of dizziness, mobility for adls, balance and activity tolerance affecting independence with all adls.  Pt would benefit from continued OT to increase adl independence so pt can eventually d/c home alone after rehab. OT Recommendation/Assessment: Patient will need skilled OT in the acute care venue OT Problem List: Decreased strength;Decreased activity tolerance;Decreased knowledge of use of DME or AE;Pain Barriers to Discharge: Decreased caregiver support Barriers to Discharge Comments: Pt lives alone.  Will need SNF OT Therapy Diagnosis : Generalized weakness;Acute pain OT Plan OT Frequency: Min 1X/week OT Treatment/Interventions: Self-care/ADL training;DME and/or AE instruction;Therapeutic activities OT Recommendation Follow Up Recommendations: Skilled nursing facility Equipment Recommended: Defer to next venue Individuals Consulted Consulted and Agree with Results and Recommendations: Patient OT Goals Acute Rehab OT Goals OT Goal Formulation: With patient Time For Goal Achievement: 2 weeks ADL Goals Pt Will Perform Grooming: with supervision;Standing at sink ADL Goal: Grooming - Progress: Progressing  toward goals Pt Will Perform Upper Body Bathing: with supervision;Sitting at sink ADL Goal: Upper Body Bathing - Progress: Progressing toward goals Pt Will Perform Lower Body Bathing: with min assist;Sit to stand from chair;Standing at sink ADL Goal: Lower Body Bathing - Progress: Progressing toward goals Pt Will Perform Upper Body Dressing: with set-up;Sitting, chair ADL Goal: Upper Body Dressing - Progress: Progressing toward goals Pt Will Perform Lower Body Dressing: with min assist;Sit to stand from chair ADL Goal: Lower Body Dressing - Progress: Progressing toward goals Pt Will Perform Tub/Shower Transfer: with min assist;Ambulation;Shower seat without back ADL Goal: Web designer - Progress: Progressing toward goals Additional ADL Goal #1: Pt will complete all aspects of toileting with S w use of BSC. ADL Goal: Additional Goal #1 - Progress: Progressing toward goals  OT Evaluation Precautions/Restrictions  Precautions Required Braces or Orthoses: No Restrictions Weight Bearing Restrictions: Yes RLE Weight Bearing: Weight bearing as tolerated Prior Functioning Home Living Lives With: Alone Type of Home: House Home Layout: One level Home Access: Stairs to enter Entrance Stairs-Rails: None Entrance Stairs-Number of Steps: 3 Bathroom Shower/Tub: Tub only;Other (comment) (french tub.  Small tub converted from old walk in shower) Bathroom Toilet: Standard Bathroom Accessibility: Yes How Accessible: Accessible via walker Home Adaptive Equipment: None Prior Function Level of Independence: Independent with homemaking with ambulation;Independent with gait Able to Take Stairs?: Yes Driving: Yes Vocation: Retired ADL ADL Eating/Feeding: Performed;Independent Where Assessed - Eating/Feeding: Chair Grooming: Simulated;Set up Grooming Details (indicate cue type and reason): pt too dizzy to stand at sink therefore sat in chair. Where Assessed - Grooming: Sitting, chair Upper  Body Bathing: Simulated;Set up Where Assessed - Upper Body Bathing: Sitting, chair Lower Body Bathing: Simulated;Maximal assistance Lower Body Bathing Details (indicate cue type and reason): pt needs great assist coming sit to stand to bathe. Where Assessed -  Lower Body Bathing: Sit to stand from chair Upper Body Dressing: Simulated;Set up Where Assessed - Upper Body Dressing: Sitting, chair Lower Body Dressing: Simulated;Maximal assistance Lower Body Dressing Details (indicate cue type and reason): needs assist to come to stand and to start clothes over feet. Where Assessed - Lower Body Dressing: Sit to stand from chair Toilet Transfer: Performed;+2 Total assistance;Comment for patient % (Pt 60%) Toilet Transfer Details (indicate cue type and reason): Pt becomes very dizzy when standing Toilet Transfer Method: Stand pivot Toilet Transfer Equipment: Bedside commode Toileting - Clothing Manipulation: Simulated;+1 Total assistance Toileting - Clothing Manipulation Details (indicate cue type and reason): pt too dizzy to let go of walker to manage clothes. Where Assessed - Toileting Clothing Manipulation: Standing Toileting - Hygiene: Minimal assistance;Simulated Where Assessed - Toileting Hygiene: Sit on 3-in-1 or toilet Tub/Shower Transfer: Not assessed Equipment Used: Rolling walker Ambulation Related to ADLs: Pt took 5 steps before becoming dizzy and BP dropping. Vision/Perception  Vision - Assessment Eye Alignment: Within Functional Limits Vision Assessment: Vision not tested Cognition Cognition Arousal/Alertness: Awake/alert Overall Cognitive Status: Appears within functional limits for tasks assessed Orientation Level: Oriented X4 Sensation/Coordination Sensation Light Touch: Appears Intact Stereognosis: Appears Intact Hot/Cold: Appears Intact Proprioception: Appears Intact Coordination Gross Motor Movements are Fluid and Coordinated: Yes Fine Motor Movements are Fluid and  Coordinated: Yes Extremity Assessment RUE Assessment RUE Assessment: Within Functional Limits LUE Assessment LUE Assessment: Within Functional Limits Mobility  Bed Mobility Bed Mobility: Yes Supine to Sit: 1: +2 Total assist Supine to Sit Details (indicate cue type and reason): Pt did 50% Transfers Transfers: Yes Sit to Stand: 1: +2 Total assist Sit to Stand Details (indicate cue type and reason): cues for hand placement on bed and walker. Stand to Sit: 1: +2 Total assist Stand to Sit Details: cues to reach back for chair behind her. Exercises   End of Session OT - End of Session Equipment Utilized During Treatment: Gait belt Activity Tolerance: Other (comment) (limited by Low BP and dizziness.) Patient left: in chair;with call bell in reach Nurse Communication: Mobility status for transfers General Behavior During Session: [facility] for tasks performed Cognition: [facility] for tasks performed   Hope Budds [phone] 09/23/2011, 9:49 AM

## 2011-09-24 LAB — BASIC METABOLIC PANEL
Chloride: 100 mEq/L (ref 96–112)
Creatinine, Ser: 0.53 mg/dL (ref 0.50–1.10)
GFR calc Af Amer: >90 mL/min (ref >90)
GFR calc non Af Amer: 82 mL/min — ABNORMAL LOW (ref >90)
Potassium: 3.8 mEq/L (ref 3.5–5.1)

## 2011-09-24 LAB — PROTIME-INR
INR: 1.64 — ABNORMAL HIGH (ref 0.00–1.49)
Prothrombin Time: 19.7 seconds — ABNORMAL HIGH (ref 11.6–15.2)

## 2011-09-24 LAB — CBC
HCT: 25.5 % — ABNORMAL LOW (ref 36.0–46.0)
Hemoglobin: 8.9 g/dL — ABNORMAL LOW (ref 12.0–15.0)
RDW: 12.6 % (ref 11.5–15.5)
WBC: 9.1 10*3/uL (ref 4.0–10.5)

## 2011-09-24 MED ORDER — WARFARIN SODIUM 5 MG PO TABS
5.0000 mg | ORAL_TABLET | Freq: Once | ORAL | Status: AC
  Administered 2011-09-24: 5 mg via ORAL
  Filled 2011-09-24: qty 1

## 2011-09-24 NOTE — Progress Notes (Signed)
Physical Therapy Treatment Patient Details Name: Madeline Booth MRN: [id number] DOB: Mar 24, 1920 Today's Date: 09/24/2011 Time: 1315-1350  1TA, 1TE PT Assessment/Plan  PT - Assessment/Plan Comments on Treatment Session: Pt still limited by orthostasis?- pt states "I'm going to faint" during ambulation and becomes less responsive. Unable to get BP reading during episode. Assisted pt back to bed. Continue to recommend SNF for rehab. PT Plan: Discharge plan remains appropriate Follow Up Recommendations: Skilled nursing facility Equipment Recommended: Defer to next venue PT Goals  Acute Rehab PT Goals PT Goal: Supine/Side to Sit - Progress: Progressing toward goal PT Goal: Sit to Stand - Progress: Progressing toward goal PT Goal: Ambulate - Progress: Progressing toward goal PT Goal: Perform Home Exercise Program - Progress: Progressing toward goal  PT Treatment Precautions/Restrictions  Precautions Precautions: Fall Required Braces or Orthoses: No Restrictions Weight Bearing Restrictions: Yes RLE Weight Bearing: Weight bearing as tolerated Mobility (including Balance) Bed Mobility Bed Mobility: Yes Supine to Sit: 3: Mod assist;HOB elevated (Comment degrees);With rails Supine to Sit Details (indicate cue type and reason): VCs safety, technique, hand placement. Assist for trunk stabilization and R LE off bed. Increased time and effort. Heavy reliance on UEs/rail for trunk to upright Sit to Supine: 1: +2 Total assist (Pt=25%) Sit to Supine - Details (indicate cue type and reason): Assist for bil LEs onto bed and trunk to supine. Pt required increased assist for back to bed due to orthostasis during attempt to ambulate to chair from [facility].  Transfers Transfers: Yes Sit to Stand: 1: +2 Total assist;From bed;With upper extremity assist;From elevated surface (Pt=50%) Sit to Stand Details (indicate cue type and reason): VCs safety, technique, hand placement. Assist to rise, stabilize. Increased  time.  Stand to Sit: 1: +2 Total assist (Pt=50%) Stand to Sit Details: VCs safety, technique, hand placement. Assist to control descent to [facility]. Stand Pivot Transfers: 1: +2 Total assist (Pt=50%) Stand Pivot Transfer Details (indicate cue type and reason): with RW to [facility]. Increased time. Assist for stability and RW negotiation.  Squat Pivot Transfers: 1: +2 Total assist (Pt=25%) Squat Pivot Transfer Details (indicate cue type and reason): Squat-pivot back to bed due to pt becoming orthostatic with attempt to ambulate to recliner from [facility]. Pt unable to safey use RW for stand pivot. Ambulation/Gait Ambulation/Gait: Yes Ambulation/Gait Assistance: 2: Max assist;1: +2 Total assist (Pt=50%) Ambulation/Gait Assistance Details (indicate cue type and reason): VCs safety, technique, sequence, posture. Assist to stabilize and negotiate with RW. Increased time and difficulty. Ambulation Distance (Feet): 3 Feet Assistive device: Rolling walker Gait Pattern: Antalgic;Decreased step length - left;Decreased step length - right;Decreased stride length;Step-to pattern    Exercise  General Exercises - Lower Extremity Ankle Circles/Pumps: AROM;Both;10 reps;Supine Quad Sets: AROM;Both;10 reps;Supine Heel Slides: AAROM;Both;10 reps;Supine Hip ABduction/ADduction: AAROM;Both;10 reps;Supine End of Session PT - End of Session Equipment Utilized During Treatment: Gait belt Activity Tolerance: Patient limited by pain;Treatment limited secondary to medical complications (Comment) (Limited by orthostasis? ) Patient left: in bed;with call bell in reach General Behavior During Session: [facility] for tasks performed Cognition: [facility] for tasks performed  [name] [facility] 09/24/2011, 3:37 PM [phone]

## 2011-09-24 NOTE — Progress Notes (Signed)
DAILY PROGRESS NOTE                              GENERAL INTERNAL MEDICINE [facility]  SUBJECTIVE: Had mild dizziness this morning while she is getting her PT. Her blood pressure went down a little bit other than that no complaints.  OBJECTIVE: BP 127/72  Pulse 70  Temp(Src) 99.3 F (37.4 C) (Oral)  Resp 16  Ht 5\' 4"  (1.626 m)  Wt 64.411 kg (142 lb)  BMI 24.37 kg/m2  SpO2 96%  Intake/Output Summary (Last 24 hours) at 09/24/11 0801 Last data filed at 09/24/11 0500  Gross per 24 hour  Intake 3763.33 ml  Output   2400 ml  Net 1363.33 ml                      Weight change:  Physical Exam: General: Alert and awake oriented x3 not in any acute distress. HEENT: anicteric sclera, pupils equal reactive to light and accommodation CVS: S1-S2 heard, no murmur rubs or gallops Chest: clear to auscultation bilaterally, no wheezing rales or rhonchi Abdomen:  normal bowel sounds, soft, nontender, nondistended, no organomegaly Neuro: Cranial nerves II-XII intact, no focal neurological deficits Extremities: no cyanosis, no clubbing or edema noted bilaterally   Lab Results:  Basename 09/24/11 0345 09/23/11 0335  NA 131* 129*  K 3.8 3.7  CL 100 98  CO2 25 25  GLUCOSE 105* 97  BUN 10 12  CREATININE 0.53 0.59  CALCIUM 7.4* 7.4*  MG -- --  PHOS -- --    Basename 09/24/11 0345 09/23/11 0335  WBC 9.1 9.8  NEUTROABS -- --  HGB 8.9* 9.1*  HCT 25.5* 26.2*  MCV 90.7 90.7  PLT 172 158    Micro Results: No results found for this or any previous visit (from the past 240 hour(s)).  Studies/Results: Dg Chest 1 View  09/20/2011  *RADIOLOGY REPORT*  Clinical Data: Fall, right hip pain  CHEST - 1 VIEW  Comparison:  Findings: Right mastectomy.  Mildly enlarged heart silhouette. Ectatic aorta.  No effusion, infiltrate, or pneumothorax.  No acute bony abnormality.  IMPRESSION: No acute cardiopulmonary findings.  Mild cardiomegaly.  Original Report Authenticated By: Genevive Bi,  M.D.   Dg Hip Complete Right  09/20/2011  *RADIOLOGY REPORT*  Clinical Data: Right hip pain the  RIGHT HIP - COMPLETE 2+ VIEW  Comparison: None.  Findings: There is a intertrochanteric fracture of the right hip with varus angulation. Hip is located. No pelvic fracture.  IMPRESSION: Right intertrochanteric femur fracture.  Original Report Authenticated By: Genevive Bi, M.D.   Medications: Scheduled Meds:    . amLODipine  5 mg Oral Daily  . hydrochlorothiazide  25 mg Oral Daily  . metoprolol succinate  50 mg Oral Daily  . olmesartan  40 mg Oral Daily  . polyethylene glycol  17 g Oral Daily  . warfarin  2.5 mg Oral ONCE-1800  . DISCONTD: metoprolol succinate  100 mg Oral Daily   Continuous Infusions:    . sodium chloride 0.9 % 1,000 mL with potassium chloride 20 mEq infusion 100 mL/hr at 09/23/11 1214   PRN Meds:.acetaminophen, acetaminophen, HYDROcodone-acetaminophen, menthol-cetylpyridinium, methocarbamol(ROBAXIN) IV, methocarbamol, morphine, ondansetron (ZOFRAN) IV, ondansetron, phenol, senna-docusate, zolpidem, DISCONTD: metoCLOPramide (REGLAN) injection, DISCONTD: metoCLOPramide, DISCONTD: morphine  ASSESSMENT & PLAN: Principal Problem:  *Intertrochanteric fracture of right femur Active Problems:  Coagulopathy  HTN (hypertension)  Atrial fibrillation  1. Intertrochanteric femoral fracture: Status  post intramedullary nailing done by Dr. Dion Saucier yesterday. PT OT recommended skilled nursing facility. Waiting for bed availability.  2. Coagulopathy: Patient is on Coumadin for atrial fibrillation, INR is 1.8. Will restart the Coumadin.  3. Atrial fibrillation: Rate controlled. I will restart Coumadin. I did decrease her beta blockers if heart rate went up I will put it back again to 100 and decrease in her the amlodipine and the Benicar  4. HTN: This is being controlled, on medication restarted, patient had some orthostatic hypotension this morning while doing physical therapy not  100% sure it was secondary to narcotics were too much antihypertensive I did decrease the metoprolol to 50.  5. Hyponatremia: Mild this is likely secondary to hydrochlorothiazide. Patient reported to have previous syncopal episodes I will discontinue this. Check BMP in the morning.  6. Disposition: To skilled nursing facility, likely Monday morning.    LOS: 4 days   Anja Neuzil A 09/24/2011, 8:01 AM

## 2011-09-24 NOTE — Progress Notes (Signed)
Subjective: 3 Days Post-Op Procedure(s) (LRB): INTRAMEDULLARY (IM) NAIL FEMORAL (Right) Patient reports pain as 5 on 0-10 scale and 6 on 0-10 scale.   Complains of not being able to get bowels moving  Objective: Vital signs in last 24 hours: Temp:  [97.6 F (36.4 C)-99.3 F (37.4 C)] 99.3 F (37.4 C) (01/12 0527) Pulse Rate:  [70-97] 97  (01/12 0931) Resp:  [16-18] 16  (01/12 0527) BP: (102-127)/(60-72) 119/69 mmHg (01/12 0931) SpO2:  [94 %-98 %] 96 % (01/12 0527)  Intake/Output from previous day: 01/11 0701 - 01/12 0700 In: 3763.3 [P.O.:480; I.V.:3283.3] Out: 2400 [Urine:2400] Intake/Output this shift:     Basename 09/24/11 0345 09/23/11 0335 09/22/11 0320  HGB 8.9* 9.1* 10.4*    Basename 09/24/11 0345 09/23/11 0335  WBC 9.1 9.8  RBC 2.81* 2.89*  HCT 25.5* 26.2*  PLT 172 158    Basename 09/24/11 0345 09/23/11 0335  NA 131* 129*  K 3.8 3.7  CL 100 98  CO2 25 25  BUN 10 12  CREATININE 0.53 0.59  GLUCOSE 105* 97  CALCIUM 7.4* 7.4*    Basename 09/24/11 0345 09/23/11 0335  LABPT -- --  INR 1.64* 1.76*    Sensation intact distally Dorsiflexion/Plantar flexion intact Incision: no drainage  Assessment/Plan: 3 Days Post-Op Procedure(s) (LRB): INTRAMEDULLARY (IM) NAIL FEMORAL (Right) Advance diet Up with therapy DC Foley. Continue plan per medicine Plan SNF on Monday  Haskel Khan 09/24/2011, 9:39 AM

## 2011-09-24 NOTE — Progress Notes (Signed)
ANTICOAGULATION CONSULT NOTE - Follow Up Consult  Pharmacy Consult for Coumadin Indication: A.Fib  No Known Allergies  Patient Measurements: Height: 5\' 4"  (162.6 cm) Weight: 142 lb (64.411 kg) IBW/kg (Calculated) : 54.7    Vital Signs: Temp: 99.3 F (37.4 C) (01/12 0527) Temp src: Oral (01/12 0527) BP: 119/69 mmHg (01/12 0931) Pulse Rate: 97  (01/12 0931)  Labs:  Basename 09/24/11 0345 09/23/11 0335 09/22/11 0320  HGB 8.9* 9.1* --  HCT 25.5* 26.2* 30.2*  PLT 172 158 182  APTT -- -- --  LABPROT 19.7* 20.8* 21.6*  INR 1.64* 1.76* 1.84*  HEPARINUNFRC -- -- --  CREATININE 0.53 0.59 0.60  CKTOTAL -- -- --  CKMB -- -- --  TROPONINI -- -- --   Estimated Creatinine Clearance: 41.2 ml/min (by C-G formula based on Cr of 0.53).   Medications:  Scheduled:    . amLODipine  5 mg Oral Daily  . metoprolol succinate  50 mg Oral Daily  . olmesartan  40 mg Oral Daily  . polyethylene glycol  17 g Oral Daily  . warfarin  2.5 mg Oral ONCE-1800  . DISCONTD: hydrochlorothiazide  25 mg Oral Daily   Infusions:    . sodium chloride 0.9 % 1,000 mL with potassium chloride 20 mEq infusion 100 mL/hr at 09/23/11 1214   PRN: acetaminophen, acetaminophen, HYDROcodone-acetaminophen, menthol-cetylpyridinium, methocarbamol(ROBAXIN) IV, methocarbamol, morphine, ondansetron (ZOFRAN) IV, ondansetron, phenol, senna-docusate, zolpidem  Assessment:  78 yo F on chronic coumadin for hx Afib. S/P ORIF R femur (POD3).   Coumadin resumed 1/10  Home dose = 2.5 mg daily  INR continues to decrease slightly - Will f/u with MD to resume lovenox since INR now < 1.8   CBC stable, no bleeding reported   Goal of Therapy:  INR 2-3   Plan:  1.) coumadin 5 mg x 1 tonight since INR continues to decrease.  2.) f/u resume lovenox while INR < 1.8?  3.) follow up INR  Bridgette Wolden, Loma Messing PharmD 11:52 AM 09/24/2011

## 2011-09-25 LAB — BASIC METABOLIC PANEL
BUN: 12 mg/dL (ref 6–23)
Calcium: 7.7 mg/dL — ABNORMAL LOW (ref 8.4–10.5)
GFR calc Af Amer: >90 mL/min (ref >90)
GFR calc non Af Amer: 78 mL/min — ABNORMAL LOW (ref >90)
Potassium: 3.8 mEq/L (ref 3.5–5.1)

## 2011-09-25 LAB — CBC
Hemoglobin: 9.4 g/dL — ABNORMAL LOW (ref 12.0–15.0)
MCH: 32.1 pg (ref 26.0–34.0)
MCHC: 35.2 g/dL (ref 30.0–36.0)
Platelets: 220 10*3/uL (ref 150–400)
RDW: 12.7 % (ref 11.5–15.5)

## 2011-09-25 LAB — PROTIME-INR: Prothrombin Time: 21.1 seconds — ABNORMAL HIGH (ref 11.6–15.2)

## 2011-09-25 MED ORDER — WARFARIN SODIUM 5 MG PO TABS
5.0000 mg | ORAL_TABLET | Freq: Once | ORAL | Status: AC
  Administered 2011-09-25: 5 mg via ORAL
  Filled 2011-09-25: qty 1

## 2011-09-25 NOTE — Progress Notes (Signed)
DAILY PROGRESS NOTE                              GENERAL INTERNAL MEDICINE [facility]  SUBJECTIVE: No complaints.  OBJECTIVE: BP 117/62  Pulse 83  Temp(Src) 99.4 F (37.4 C) (Oral)  Resp 20  Ht 5\' 4"  (1.626 m)  Wt 64.411 kg (142 lb)  BMI 24.37 kg/m2  SpO2 97%  Intake/Output Summary (Last 24 hours) at 09/25/11 0910 Last data filed at 09/25/11 0700  Gross per 24 hour  Intake 3023.34 ml  Output   3250 ml  Net -226.66 ml                      Weight change:  Physical Exam: General: Alert and awake oriented x3 not in any acute distress. HEENT: anicteric sclera, pupils equal reactive to light and accommodation CVS: S1-S2 heard, no murmur rubs or gallops Chest: clear to auscultation bilaterally, no wheezing rales or rhonchi Abdomen:  normal bowel sounds, soft, nontender, nondistended, no organomegaly Neuro: Cranial nerves II-XII intact, no focal neurological deficits Extremities: no cyanosis, no clubbing or edema noted bilaterally   Lab Results:  Basename 09/25/11 0423 09/24/11 0345  NA 131* 131*  K 3.8 3.8  CL 100 100  CO2 26 25  GLUCOSE 107* 105*  BUN 12 10  CREATININE 0.60 0.53  CALCIUM 7.7* 7.4*  MG -- --  PHOS -- --    Basename 09/25/11 0423 09/24/11 0345  WBC 8.5 9.1  NEUTROABS -- --  HGB 9.4* 8.9*  HCT 26.7* 25.5*  MCV 91.1 90.7  PLT 220 172    Micro Results: No results found for this or any previous visit (from the past 240 hour(s)).  Studies/Results: Dg Chest 1 View  09/20/2011  *RADIOLOGY REPORT*  Clinical Data: Fall, right hip pain  CHEST - 1 VIEW  Comparison:  Findings: Right mastectomy.  Mildly enlarged heart silhouette. Ectatic aorta.  No effusion, infiltrate, or pneumothorax.  No acute bony abnormality.  IMPRESSION: No acute cardiopulmonary findings.  Mild cardiomegaly.  Original Report Authenticated By: Genevive Bi, M.D.   Dg Hip Complete Right  09/20/2011  *RADIOLOGY REPORT*  Clinical Data: Right hip pain the  RIGHT HIP - COMPLETE  2+ VIEW  Comparison: None.  Findings: There is a intertrochanteric fracture of the right hip with varus angulation. Hip is located. No pelvic fracture.  IMPRESSION: Right intertrochanteric femur fracture.  Original Report Authenticated By: Genevive Bi, M.D.   Medications: Scheduled Meds:    . amLODipine  5 mg Oral Daily  . metoprolol succinate  50 mg Oral Daily  . olmesartan  40 mg Oral Daily  . polyethylene glycol  17 g Oral Daily  . warfarin  5 mg Oral ONCE-1800   Continuous Infusions:    . sodium chloride 0.9 % 1,000 mL with potassium chloride 20 mEq infusion 100 mL/hr at 09/25/11 0538   PRN Meds:.acetaminophen, acetaminophen, HYDROcodone-acetaminophen, menthol-cetylpyridinium, methocarbamol(ROBAXIN) IV, methocarbamol, morphine, ondansetron (ZOFRAN) IV, ondansetron, phenol, senna-docusate, zolpidem  ASSESSMENT & PLAN: Principal Problem:  *Intertrochanteric fracture of right femur Active Problems:  Coagulopathy  HTN (hypertension)  Atrial fibrillation  1. Intertrochanteric femoral fracture: Status post intramedullary nailing done by Dr. Dion Saucier yesterday. PT OT recommended skilled nursing facility. Waiting for bed availability.  2. Coagulopathy: Patient is on Coumadin for atrial fibrillation, INR is 1.7. Coumadin restarted.  3. Atrial fibrillation: Rate controlled. I will restart Coumadin. I did decrease her beta  blockers if heart rate went up I will put it back again to 100 and decrease in her the amlodipine and the Benicar  4. HTN: This is being controlled, on medication restarted, patient had some orthostatic hypotension this morning while doing physical therapy not 100% sure it was secondary to narcotics were too much antihypertensive I did decrease the metoprolol to 50. And discontinue the HCTZ  5. Hyponatremia: Mild this is likely secondary to hydrochlorothiazide. Patient reported to have previous syncopal episodes I will discontinue this. Check BMP in the morning.  6.  Disposition: To skilled nursing facility, likely Monday morning.    LOS: 5 days   Oniya Mandarino A 09/25/2011, 9:10 AM

## 2011-09-25 NOTE — Progress Notes (Signed)
Subjective: 4 Days Post-Op Procedure(s) (LRB): INTRAMEDULLARY (IM) NAIL FEMORAL (Right) Patient reports pain as 5 on 0-10 scale and 6 on 0-10 scale.   Patient states that she is able to tolerate the pain but she is worried that she will not be abel to get into SNF without being able to get up and around by herself. Objective: Vital signs in last 24 hours: Temp:  [98.1 F (36.7 C)-99.4 F (37.4 C)] 99.4 F (37.4 C) (01/13 0445) Pulse Rate:  [83-85] 83  (01/13 0445) Resp:  [18-20] 20  (01/13 0445) BP: (117-136)/(62-68) 117/62 mmHg (01/13 0445) SpO2:  [97 %-100 %] 97 % (01/13 0445)  Intake/Output from previous day: 01/12 0701 - 01/13 0700 In: 3023.3 [P.O.:450; I.V.:2573.3] Out: 3250 [Urine:3250] Intake/Output this shift: Total I/O In: 360 [P.O.:360] Out: 200 [Urine:200]   Basename 09/25/11 0423 09/24/11 0345 09/23/11 0335  HGB 9.4* 8.9* 9.1*    Basename 09/25/11 0423 09/24/11 0345  WBC 8.5 9.1  RBC 2.93* 2.81*  HCT 26.7* 25.5*  PLT 220 172    Basename 09/25/11 0423 09/24/11 0345  NA 131* 131*  K 3.8 3.8  CL 100 100  CO2 26 25  BUN 12 10  CREATININE 0.60 0.53  GLUCOSE 107* 105*  CALCIUM 7.7* 7.4*    Basename 09/25/11 0423 09/24/11 0345  LABPT -- --  INR 1.79* 1.64*    Sensation intact distally Dorsiflexion/Plantar flexion intact Incision: dressing C/D/I  Assessment/Plan: 4 Days Post-Op Procedure(s) (LRB): INTRAMEDULLARY (IM) NAIL FEMORAL (Right) Advance diet Up with therapy Continue plan per medicine.  Answered all Questions about SNF and assured patient that she was progressing as expected.    Haskel Khan 09/25/2011, 9:42 AM

## 2011-09-25 NOTE — Progress Notes (Signed)
ANTICOAGULATION CONSULT NOTE - Follow Up Consult  Pharmacy Consult for: Warfarin Indication: atrial fibrillation  No Known Allergies  Patient Measurements: Height: 5\' 4"  (162.6 cm) Weight: 142 lb (64.411 kg) IBW/kg (Calculated) : 54.7    Vital Signs: Temp: 99.4 F (37.4 C) (01/13 0445) Temp src: Oral (01/13 0445) BP: 149/79 mmHg (01/13 1054) Pulse Rate: 85  (01/13 1054)  Labs:  Basename 09/25/11 0423 09/24/11 0345 09/23/11 0335  HGB 9.4* 8.9* --  HCT 26.7* 25.5* 26.2*  PLT 220 172 158  APTT -- -- --  LABPROT 21.1* 19.7* 20.8*  INR 1.79* 1.64* 1.76*  HEPARINUNFRC -- -- --  CREATININE 0.60 0.53 0.59  CKTOTAL -- -- --  CKMB -- -- --  TROPONINI -- -- --   Estimated Creatinine Clearance: 41.2 ml/min (by C-G formula based on Cr of 0.6).   Medications:  Scheduled:    . amLODipine  5 mg Oral Daily  . metoprolol succinate  50 mg Oral Daily  . olmesartan  40 mg Oral Daily  . polyethylene glycol  17 g Oral Daily  . warfarin  5 mg Oral ONCE-1800   Infusions:    . sodium chloride 0.9 % 1,000 mL with potassium chloride 20 mEq infusion 100 mL/hr at 09/25/11 0538   PRN: acetaminophen, acetaminophen, HYDROcodone-acetaminophen, menthol-cetylpyridinium, methocarbamol(ROBAXIN) IV, methocarbamol, morphine, ondansetron (ZOFRAN) IV, ondansetron, phenol, senna-docusate, zolpidem  Assessment:  78 yo F on chronic coumadin for hx Afib. S/P ORIF R femur (POD4). Coumadin resumed 1/10  Home dose = 2.5 mg Daily  INR now rising toward goal.  CBC stable, no bleeding reported  Goal of Therapy:  INR 2-3   Plan:  1.) repeat coumadin 5 mg po x 1  2.) follow up am INR  Derryl Uher, Loma Messing 09/25/2011,11:06 AM

## 2011-09-26 LAB — BASIC METABOLIC PANEL
BUN: 13 mg/dL (ref 6–23)
Chloride: 102 mEq/L (ref 96–112)
Creatinine, Ser: 0.55 mg/dL (ref 0.50–1.10)
GFR calc non Af Amer: 81 mL/min — ABNORMAL LOW (ref >90)
Glucose, Bld: 102 mg/dL — ABNORMAL HIGH (ref 70–99)
Potassium: 3.7 mEq/L (ref 3.5–5.1)

## 2011-09-26 MED ORDER — METOPROLOL SUCCINATE ER 50 MG PO TB24: 50.0000 mg | ORAL_TABLET | Freq: Every day | ORAL | Status: DC

## 2011-09-26 MED ORDER — HYDROCODONE-ACETAMINOPHEN 5-325 MG PO TABS: 1.0000 | ORAL_TABLET | Freq: Four times a day (QID) | ORAL | Status: AC | PRN

## 2011-09-26 MED ORDER — OLMESARTAN MEDOXOMIL 40 MG PO TABS: 40.0000 mg | ORAL_TABLET | Freq: Every day | ORAL | Status: DC

## 2011-09-26 NOTE — Progress Notes (Signed)
PT Cancellation Note  Treatment cancelled today due to patient's refusal to participate....she is being discharged to SNF today and will do her walking there.  Madeline Booth 09/26/2011, 1:21 PM

## 2011-09-26 NOTE — Progress Notes (Signed)
Patient set to discharge to [facility] today. Patient completed admission paperwork with Tammie from [facility] @ bedside. [facility] called for transportation to pick her up @ 2:30.   Unice Bailey, LCSWA [phone]

## 2011-09-26 NOTE — Discharge Summary (Signed)
HOSPITAL DISCHARGE SUMMARY  Madeline Booth  MRN: [id number]  DOB:11-Jan-1920  Date of Admission: 09/20/2011 Date of Discharge: 09/26/2011         LOS: 6 days   Attending Physician:Layken Doenges A  Patient's PCP:No primary provider on file.  Consults: Treatment Team:  Eulas Post   Discharge Diagnoses: Present on Admission:  .Intertrochanteric fracture of right femur .Coagulopathy .HTN (hypertension) .Atrial fibrillation   Current Discharge Medication List    START taking these medications   Details  calcium-vitamin D (OSCAL WITH D) 500-200 MG-UNIT per tablet Take 1 tablet by mouth daily. Qty: 100 tablet, Refills: 2    HYDROcodone-acetaminophen (NORCO) 5-325 MG per tablet Take 1-2 tablets by mouth every 6 (six) hours as needed for pain. MAXIMUM TOTAL ACETAMINOPHEN DOSE IS 4000 MG PER DAY Qty: 30 tablet, Refills: 0    olmesartan (BENICAR) 40 MG tablet Take 1 tablet (40 mg total) by mouth daily. Qty: 30 tablet, Refills: 0      CONTINUE these medications which have CHANGED   Details  metoprolol succinate (TOPROL-XL) 50 MG 24 hr tablet Take 1 tablet (50 mg total) by mouth daily. Qty: 30 tablet, Refills: 0      CONTINUE these medications which have NOT CHANGED   Details  alendronate (FOSAMAX) 70 MG tablet Take 70 mg by mouth every 7 (seven) days. Takes every Sunday.Marland KitchenMarland KitchenTake with a full glass of water on an empty stomach.     amLODipine (NORVASC) 5 MG tablet Take 5 mg by mouth daily.      warfarin (COUMADIN) 2.5 MG tablet Take 2.5 mg by mouth daily.        STOP taking these medications     olmesartan-hydrochlorothiazide (BENICAR HCT) 40-25 MG per tablet          Brief Admission History: Patient is a 78 year old woman history of hypertension and on Coumadin possibly for remote history of syncope who presents status post mechanical fall. Was gardening and trying to put on her shoe when she fell backwards. Denies any loss of consciousness or head trauma. No  presyncopal symptoms. Called EMS for further assistance.  In the ED, temperature 97.9 with blood pressure 157/66, heart rate 81, respirations 18, satting 99% on room air. Right hip radiographs demonstrating a right femur intertrochanteric fracture. INR 2.07. Ortho was consulted, to see the patient in the morning. Recommended admission to Medical service to assist with medical management.  Hospital Course: Present on Admission:  .Intertrochanteric fracture of right femur .Coagulopathy .HTN (hypertension) .Atrial fibrillation  1. Intertrochanteric fracture of the right femur: Patient admitted to the hospital and orthopedic service was consulted. Patient being evaluated the very next day she had repair of her fracture by internal medullary nailing done by Dr. Dion Saucier in 09/21/2011. Patient participated in physical therapy afterwards and they did recommend placement in the skilled nursing facility.  2. Hypertension: This is being controlled. Patient was in the low side some time. She was reported to have a syncopal episode from before. She did have low blood pressure while she was participating in PT/OT. And her antihypertensive medication or change to Lopid. Her interval cut in half to 50 mg and we discontinued the hydrochlorothiazide. Blood pressure was very controlled after that.  3. Atrial fibrillation: Patient has very controlled rate while she is in the hospital. She is on Coumadin. Her INR at time of discharge 2.5. At the time of admission the the Coumadin was held because of anticipated procedure. For the Coumadin dosage please  see the discharge medication list.   Day of Discharge BP 129/67  Pulse 78  Temp(Src) 98.6 F (37 C) (Oral)  Resp 19  Ht 5\' 4"  (1.626 m)  Wt 64.411 kg (142 lb)  BMI 24.37 kg/m2  SpO2 96% Physical Exam: GEN: No acute distress, cooperative with exam PSYCH: He is alert and oriented x4; does not appear anxious does not appear depressed; affect is normal  HEENT:  Mucous membranes pink and anicteric;  Mouth: without oral thrush or lesions Eyes: PERRLA; EOM intact;  Neck: no cervical lymphadenopathy nor thyromegaly or carotid bruit; no JVD;  CHEST WALL: No tenderness, symmetrical to breathing bilaterally CHEST: Normal respiration, clear to auscultation bilaterally  HEART: Regular rate and rhythm; no murmurs, rubs or gallops, S1 and S2 heard  BACK: No kyphosis or scoliosis; no CVA tenderness  ABDOMEN:  soft non-tender; no masses, no organomegaly, normal abdominal bowel sounds; no pannus; no intertriginous candida.  EXTREMITIES: No bone or joint deformity; no edema; no ulcerations.  PULSES: 2+ and symmetric, neurovascularity is intact SKIN: Normal hydration no rash or ulceration, no flushing or suspicious lesions  CNS: Cranial nerves 2-12 grossly intact no focal neurologic deficit, coordination is intact gait not tested    Results for orders placed during the hospital encounter of 09/20/11 (from the past 24 hour(s))  PROTIME-INR     Status: Abnormal   Collection Time   09/26/11  3:30 AM      Component Value Range   Prothrombin Time 27.5 (*) 11.6 - 15.2 (seconds)   INR 2.51 (*) 0.00 - 1.49   BASIC METABOLIC PANEL     Status: Abnormal   Collection Time   09/26/11  3:30 AM      Component Value Range   Sodium 132 (*) 135 - 145 (mEq/L)   Potassium 3.7  3.5 - 5.1 (mEq/L)   Chloride 102  96 - 112 (mEq/L)   CO2 24  19 - 32 (mEq/L)   Glucose, Bld 102 (*) 70 - 99 (mg/dL)   BUN 13  6 - 23 (mg/dL)   Creatinine, Ser 8.29  0.50 - 1.10 (mg/dL)   Calcium 7.9 (*) 8.4 - 10.5 (mg/dL)   GFR calc non Af Amer 81 (*) >90 (mL/min)   GFR calc Af Amer >90  >90 (mL/min)    Disposition: Skilled nursing facility.   Follow-up Appts: Discharge Orders    Future Orders Please Complete By Expires   Diet general      Diet - low sodium heart healthy      Call MD / Call 911      Comments:   If you experience chest pain or shortness of breath, CALL 911 and be  transported to the hospital emergency room.  If you develope a fever above 101 F, pus (white drainage) or increased drainage or redness at the wound, or calf pain, call your surgeon's office.   Constipation Prevention      Comments:   Drink plenty of fluids.  Prune juice may be helpful.  You may use a stool softener, such as Colace (over the counter) 100 mg twice a day.  Use MiraLax (over the counter) for constipation as needed.   Increase activity slowly as tolerated      Weight Bearing as taught in Physical Therapy      Comments:   Use a walker or crutches as instructed.   Do not sit on low chairs, stoools or toilet seats, as it may be difficult to get up  from low surfaces      Discharge wound care:      Comments:   If you have a hip bandage, keep it clean and dry.  Change your bandage as instructed by your health care providers.  If your bandage has been discontinued, keep your incision clean and dry.  Pat dry after bathing.  DO NOT put lotion or powder on your incision.   Increase activity slowly         Follow-up Information    Follow up with LANDAU,JOSHUA P in 2 weeks.   Contact information:   [facility] [address] [place] [address] [phone]          I spent 40 minutes completing paperwork and coordinating discharge efforts.  SignedClydia Llano A 09/26/2011, 8:18 AM

## 2011-09-26 NOTE — Progress Notes (Signed)
Subjective: 5 Days Post-Op Procedure(s) (LRB): INTRAMEDULLARY (IM) NAIL FEMORAL (Right) Patient reports pain as 5 on 0-10 scale.   She is doing some better. Objective: Vital signs in last 24 hours: Temp:  [98 F (36.7 C)-98.6 F (37 C)] 98.6 F (37 C) (01/14 0505) Pulse Rate:  [78-86] 78  (01/14 0505) Resp:  [18-20] 19  (01/14 0505) BP: (129-149)/(67-79) 129/67 mmHg (01/14 0505) SpO2:  [96 %-97 %] 96 % (01/14 0505)  Intake/Output from previous day: 01/13 0701 - 01/14 0700 In: 3104.2 [P.O.:1460; I.V.:1644.2] Out: 2051 [Urine:2050; Stool:1] Intake/Output this shift:     Basename 09/25/11 0423 09/24/11 0345  HGB 9.4* 8.9*    Basename 09/25/11 0423 09/24/11 0345  WBC 8.5 9.1  RBC 2.93* 2.81*  HCT 26.7* 25.5*  PLT 220 172    Basename 09/26/11 0330 09/25/11 0423  NA 132* 131*  K 3.7 3.8  CL 102 100  CO2 24 26  BUN 13 12  CREATININE 0.55 0.60  GLUCOSE 102* 107*  CALCIUM 7.9* 7.7*    Basename 09/26/11 0330 09/25/11 0423  LABPT -- --  INR 2.51* 1.79*    Sensation intact distally Intact pulses distally Dorsiflexion/Plantar flexion intact Incision: dressing C/D/I  Assessment/Plan: 5 Days Post-Op Procedure(s) (LRB): INTRAMEDULLARY (IM) NAIL FEMORAL (Right) Advance diet Up with therapy Discharge to SNF Continue plan per Medicine WBAT Rt lower Ext. Dry dressing change QOD Ortho signing OFF. She is planning on going to skilled nursing today. Return to clinic in 2 weeks with [place].  Haskel Khan 09/26/2011, 8:22 AM

## 2011-09-26 NOTE — Progress Notes (Signed)
Patient discharged to SNF, all discharge medications and instructions reviewed and questions answered. Copies of all discharge paperwork to be sent to facility with patient.

## 2012-02-27 ENCOUNTER — Encounter: Payer: Self-pay | Admitting: *Deleted

## 2012-02-27 ENCOUNTER — Other Ambulatory Visit: Payer: Self-pay | Admitting: *Deleted

## 2012-02-27 DIAGNOSIS — D689 Coagulation defect, unspecified: Secondary | ICD-10-CM

## 2012-02-29 ENCOUNTER — Telehealth: Payer: Self-pay | Admitting: *Deleted

## 2012-02-29 NOTE — Telephone Encounter (Signed)
Made patient appointment for 05-03-2012 starting at 11:30am with labs followed by the md appointment

## 2012-05-03 ENCOUNTER — Ambulatory Visit (HOSPITAL_BASED_OUTPATIENT_CLINIC_OR_DEPARTMENT_OTHER): Payer: Medicare Other | Admitting: Oncology

## 2012-05-03 ENCOUNTER — Other Ambulatory Visit (HOSPITAL_BASED_OUTPATIENT_CLINIC_OR_DEPARTMENT_OTHER): Payer: Medicare Other | Admitting: Lab

## 2012-05-03 ENCOUNTER — Telehealth: Payer: Self-pay | Admitting: *Deleted

## 2012-05-03 VITALS — BP 180/84 | HR 72 | Temp 98.1°F | Resp 20 | Ht 64.0 in | Wt 139.9 lb

## 2012-05-03 DIAGNOSIS — M810 Age-related osteoporosis without current pathological fracture: Secondary | ICD-10-CM

## 2012-05-03 DIAGNOSIS — Z7901 Long term (current) use of anticoagulants: Secondary | ICD-10-CM

## 2012-05-03 DIAGNOSIS — C50919 Malignant neoplasm of unspecified site of unspecified female breast: Secondary | ICD-10-CM

## 2012-05-03 DIAGNOSIS — D689 Coagulation defect, unspecified: Secondary | ICD-10-CM

## 2012-05-03 DIAGNOSIS — E559 Vitamin D deficiency, unspecified: Secondary | ICD-10-CM

## 2012-05-03 DIAGNOSIS — C50519 Malignant neoplasm of lower-outer quadrant of unspecified female breast: Secondary | ICD-10-CM

## 2012-05-03 LAB — CBC WITH DIFFERENTIAL/PLATELET
BASO%: 0.3 % (ref 0.0–2.0)
Eosinophils Absolute: 0.1 10*3/uL (ref 0.0–0.5)
LYMPH%: 26.6 % (ref 14.0–49.7)
MCHC: 34.9 g/dL (ref 31.5–36.0)
MONO#: 0.6 10*3/uL (ref 0.1–0.9)
NEUT#: 4.5 10*3/uL (ref 1.5–6.5)
Platelets: 220 10*3/uL (ref 145–400)
RBC: 4.12 10*6/uL (ref 3.70–5.45)
RDW: 13.2 % (ref 11.2–14.5)
WBC: 7.2 10*3/uL (ref 3.9–10.3)
lymph#: 1.9 10*3/uL (ref 0.9–3.3)

## 2012-05-03 NOTE — Progress Notes (Signed)
Hematology and Oncology Follow Up Visit  Madeline Booth [id number] 01-18-1920 78 y.o. 05/03/2012 12:53 PM   DIAGNOSIS:   Encounter Diagnoses  Name Primary?  . Malignant neoplasm of breast (female), unspecified site Yes  . Unspecified vitamin D deficiency      PAST THERAPY:  T1 C. N0 breast cancer status post lumpectomy 04/06/2005, status post radiation therapy completed 05/28/2005 previously on Arimidex.  #2 history of previous breast cancer involving right breast status post mastectomy.  #3 history of labile hypertension #4 history of osteopenia  Interim History:  Patient returns for followup back in January of this year she fell and fractured her left femur. She required intraoperative trochanteric fixation. She spent about 5 weeks and rehabilitation and is doing well. She is essentially same medication as before. She continues on alendronate for a bone density issues. Her hypertension continues to be a somewhat labile.  Medications: I have reviewed the patient's current medications.  Allergies: No Known Allergies  Past Medical History, Surgical history, Social history, and Family History were reviewed and updated.  Review of Systems: Constitutional:  Negative for fever, chills, night sweats, anorexia, weight loss, pain. Cardiovascular: negative Respiratory: negative Neurological: negative Dermatological: negative ENT: negative Skin Gastrointestinal: negative Genito-Urinary: negative Hematological and Lymphatic: negative Breast: negative Musculoskeletal: negative Remaining ROS negative.  Physical Exam:  Blood pressure 180/84, pulse 72, temperature 98.1 F (36.7 C), temperature source Oral, resp. rate 20, height 5\' 4"  (1.626 m), weight 139 lb 14.4 oz (63.458 kg).  ECOG: 0   HEENT:  Sclerae anicteric, conjunctivae pink.  Oropharynx clear.  No mucositis or candidiasis.  Nodes:  No cervical, supraclavicular, or axillary lymphadenopathy palpated.  Breast Exam: Right  chest wall exam is unremarkable  Left breast is benign.  No masses, discharge, skin change, or nipple inversion..  Lungs:  Clear to auscultation bilaterally.  No crackles, rhonchi, or wheezes.  Heart:  Regular rate and rhythm, systolic murmurs heard  Abdomen:  Soft, nontender.  Positive bowel sounds.  No organomegaly or masses palpated.  Musculoskeletal:  No focal spinal tenderness to palpation.  Extremities:  Benign.  No peripheral edema or cyanosis.  Skin:  Benign.  Neuro:  Nonfocal.   Lab Results: Lab Results  Component Value Date   WBC 7.2 05/03/2012   HGB 12.8 05/03/2012   HCT 36.7 05/03/2012   MCV 89.1 05/03/2012   PLT 220 05/03/2012     Chemistry      Component Value Date/Time   NA 132* 09/26/2011 0330   K 3.7 09/26/2011 0330   CL 102 09/26/2011 0330   CO2 24 09/26/2011 0330   BUN 13 09/26/2011 0330   CREATININE 0.55 09/26/2011 0330      Component Value Date/Time   CALCIUM 7.9* 09/26/2011 0330   ALKPHOS 88 04/26/2011 1552   AST 22 04/26/2011 1552   ALT 15 04/26/2011 1552   BILITOT 0.3 04/26/2011 1552       Radiological Studies:  No results found.   IMPRESSIONS AND PLAN: A 78 y.o. female with   History of bilateral breast cancer status post second cancer diagnosed 2006 status post lumpectomy radiation and 5 years or Arimidex. She is recovered from a fracture the femur. Her primary care doctor's managing her bone density issues. From my perspective I think doing relatively well. I will see her in a years time with appropriate imaging studies. She has her mammograms scheduled separately in the last one was done in February and apparently was within normal limits.  Spent more  than half the time coordinating care, as well as discussion of BMI and its implications.      Abdurahman Rugg 8/22/201312:53 PM Cell [phone]

## 2012-05-03 NOTE — Telephone Encounter (Signed)
Gave patient appointment for 2014 

## 2012-05-04 LAB — COMPREHENSIVE METABOLIC PANEL
ALT: 13 U/L (ref 0–35)
Albumin: 3.4 g/dL — ABNORMAL LOW (ref 3.5–5.2)
CO2: 29 mEq/L (ref 19–32)
Chloride: 99 mEq/L (ref 96–112)
Glucose, Bld: 86 mg/dL (ref 70–99)
Potassium: 4.2 mEq/L (ref 3.5–5.3)
Sodium: 132 mEq/L — ABNORMAL LOW (ref 135–145)
Total Protein: 6.5 g/dL (ref 6.0–8.3)

## 2012-05-04 LAB — CANCER ANTIGEN 27.29: CA 27.29: 14 U/mL (ref 0–39)

## 2012-05-04 LAB — VITAMIN D 25 HYDROXY (VIT D DEFICIENCY, FRACTURES): Vit D, 25-Hydroxy: 60 ng/mL (ref 30–89)

## 2012-12-01 ENCOUNTER — Telehealth: Payer: Self-pay | Admitting: Oncology

## 2012-12-01 ENCOUNTER — Encounter: Payer: Self-pay | Admitting: Oncology

## 2012-12-01 NOTE — Telephone Encounter (Signed)
Called pt and no anwer, appt calendar and letter mailed to pt , a former [facility] pt now KK

## 2012-12-06 ENCOUNTER — Telehealth: Payer: Self-pay | Admitting: Oncology

## 2012-12-06 NOTE — Telephone Encounter (Signed)
Per email from [facility] September lb/fu cx. Former [facility] pt does not wish to have f/u and will call if she needs one.

## 2013-05-03 ENCOUNTER — Ambulatory Visit: Payer: Medicare Other | Admitting: Family

## 2013-05-03 ENCOUNTER — Other Ambulatory Visit: Payer: Medicare Other | Admitting: Lab

## 2013-05-03 ENCOUNTER — Ambulatory Visit: Payer: Medicare Other | Admitting: Oncology

## 2013-06-03 ENCOUNTER — Ambulatory Visit: Payer: Medicare Other | Admitting: Family

## 2013-07-13 ENCOUNTER — Emergency Department (HOSPITAL_COMMUNITY): Payer: Medicare PPO

## 2013-07-13 ENCOUNTER — Observation Stay (HOSPITAL_COMMUNITY)
Admission: EM | Admit: 2013-07-13 | Discharge: 2013-07-15 | Disposition: A | Discharge status: Skilled Nursing Facility | Payer: Medicare PPO | Attending: Family Medicine | Admitting: Family Medicine

## 2013-07-13 ENCOUNTER — Encounter (HOSPITAL_COMMUNITY): Payer: Self-pay | Admitting: Emergency Medicine

## 2013-07-13 DIAGNOSIS — Z79899 Other long term (current) drug therapy: Secondary | ICD-10-CM | POA: Insufficient documentation

## 2013-07-13 DIAGNOSIS — S32509A Unspecified fracture of unspecified pubis, initial encounter for closed fracture: Secondary | ICD-10-CM | POA: Insufficient documentation

## 2013-07-13 DIAGNOSIS — S32512A Fracture of superior rim of left pubis, initial encounter for closed fracture: Secondary | ICD-10-CM | POA: Diagnosis present

## 2013-07-13 DIAGNOSIS — R55 Syncope and collapse: Principal | ICD-10-CM | POA: Diagnosis present

## 2013-07-13 DIAGNOSIS — Z7901 Long term (current) use of anticoagulants: Secondary | ICD-10-CM | POA: Insufficient documentation

## 2013-07-13 DIAGNOSIS — W19XXXA Unspecified fall, initial encounter: Secondary | ICD-10-CM | POA: Insufficient documentation

## 2013-07-13 DIAGNOSIS — I10 Essential (primary) hypertension: Secondary | ICD-10-CM | POA: Insufficient documentation

## 2013-07-13 DIAGNOSIS — Q762 Congenital spondylolisthesis: Secondary | ICD-10-CM | POA: Insufficient documentation

## 2013-07-13 DIAGNOSIS — E559 Vitamin D deficiency, unspecified: Secondary | ICD-10-CM | POA: Insufficient documentation

## 2013-07-13 NOTE — ED Notes (Signed)
Per EMS pt states she woke on the floor in her hallway, unsure how she got there. Memory loss prior to event of falling and passing out or passed out then falling. She doesn't remember chest pain or other complaint prior to incident. Bruising to left side of face and eye. Pain to hip, no indication of fracture. Pt able to stand and walk without difficulty.   BP: 176/100 P :  89 Spo2 96 RA CBG: 111

## 2013-07-13 NOTE — ED Notes (Signed)
Bed: WA24 Expected date:  Expected time:  Means of arrival:  Comments: EMS-syncope 

## 2013-07-13 NOTE — ED Notes (Signed)
Bruising to lf side of face, mouth, nose.  States she blew her nose and blood came out. She has dried blood on lips.   Pt is alert and oriented x 4. States she recalls walking to put up laundry and remembered getting up off the floor. She called her friend and 911.

## 2013-07-14 DIAGNOSIS — S32512A Fracture of superior rim of left pubis, initial encounter for closed fracture: Secondary | ICD-10-CM | POA: Diagnosis present

## 2013-07-14 DIAGNOSIS — I359 Nonrheumatic aortic valve disorder, unspecified: Secondary | ICD-10-CM

## 2013-07-14 DIAGNOSIS — S32509A Unspecified fracture of unspecified pubis, initial encounter for closed fracture: Secondary | ICD-10-CM

## 2013-07-14 DIAGNOSIS — R55 Syncope and collapse: Secondary | ICD-10-CM | POA: Diagnosis present

## 2013-07-14 LAB — BASIC METABOLIC PANEL
BUN: 14 mg/dL (ref 6–23)
BUN: 15 mg/dL (ref 6–23)
Calcium: 9.3 mg/dL (ref 8.4–10.5)
Creatinine, Ser: 0.55 mg/dL (ref 0.50–1.10)
GFR calc Af Amer: >90 mL/min (ref >90)
GFR calc Af Amer: >90 mL/min (ref >90)
GFR calc non Af Amer: 80 mL/min — ABNORMAL LOW (ref >90)
GFR calc non Af Amer: 81 mL/min — ABNORMAL LOW (ref >90)
Glucose, Bld: 121 mg/dL — ABNORMAL HIGH (ref 70–99)
Glucose, Bld: 129 mg/dL — ABNORMAL HIGH (ref 70–99)
Potassium: 3.5 mEq/L (ref 3.5–5.1)
Sodium: 129 mEq/L — ABNORMAL LOW (ref 135–145)

## 2013-07-14 LAB — PRO B NATRIURETIC PEPTIDE: Pro B Natriuretic peptide (BNP): 734.3 pg/mL — ABNORMAL HIGH (ref 0–450)

## 2013-07-14 LAB — CBC
HCT: 35.9 % — ABNORMAL LOW (ref 36.0–46.0)
Hemoglobin: 12.5 g/dL (ref 12.0–15.0)
MCH: 32.2 pg (ref 26.0–34.0)
MCHC: 34.6 g/dL (ref 30.0–36.0)
MCHC: 34.8 g/dL (ref 30.0–36.0)
MCV: 91.6 fL (ref 78.0–100.0)
RDW: 12.7 % (ref 11.5–15.5)
RDW: 12.9 % (ref 11.5–15.5)

## 2013-07-14 LAB — URINALYSIS, ROUTINE W REFLEX MICROSCOPIC
Glucose, UA: NEGATIVE mg/dL
Hgb urine dipstick: NEGATIVE
Ketones, ur: NEGATIVE mg/dL
Protein, ur: NEGATIVE mg/dL
Urobilinogen, UA: 0.2 mg/dL (ref 0.0–1.0)

## 2013-07-14 LAB — PROTIME-INR
INR: 2.96 — ABNORMAL HIGH (ref 0.00–1.49)
Prothrombin Time: 29.8 seconds — ABNORMAL HIGH (ref 11.6–15.2)

## 2013-07-14 LAB — TROPONIN I: Troponin I: <0.3 ng/mL (ref <0.30)

## 2013-07-14 MED ORDER — AMLODIPINE BESYLATE 2.5 MG PO TABS
2.5000 mg | ORAL_TABLET | Freq: Every day | ORAL | Status: DC
  Administered 2013-07-14: 2.5 mg via ORAL
  Filled 2013-07-14 (×3): qty 1

## 2013-07-14 MED ORDER — PNEUMOCOCCAL VAC POLYVALENT 25 MCG/0.5ML IJ INJ
0.5000 mL | INJECTION | INTRAMUSCULAR | Status: DC
  Filled 2013-07-14: qty 0.5

## 2013-07-14 MED ORDER — WARFARIN SODIUM 2.5 MG PO TABS
2.5000 mg | ORAL_TABLET | Freq: Once | ORAL | Status: AC
  Administered 2013-07-14: 2.5 mg via ORAL
  Filled 2013-07-14: qty 1

## 2013-07-14 MED ORDER — SODIUM CHLORIDE 0.9 % IJ SOLN
3.0000 mL | Freq: Two times a day (BID) | INTRAMUSCULAR | Status: DC
  Administered 2013-07-14 – 2013-07-15 (×3): 3 mL via INTRAVENOUS

## 2013-07-14 MED ORDER — WARFARIN - PHARMACIST DOSING INPATIENT: Freq: Every day | Status: DC

## 2013-07-14 MED ORDER — IRBESARTAN 300 MG PO TABS
300.0000 mg | ORAL_TABLET | Freq: Every day | ORAL | Status: DC
  Administered 2013-07-14 – 2013-07-15 (×2): 300 mg via ORAL
  Filled 2013-07-14 (×3): qty 1

## 2013-07-14 MED ORDER — METOPROLOL SUCCINATE ER 50 MG PO TB24
50.0000 mg | ORAL_TABLET | Freq: Every day | ORAL | Status: DC
  Administered 2013-07-14 – 2013-07-15 (×2): 50 mg via ORAL
  Filled 2013-07-14 (×3): qty 1

## 2013-07-14 MED ORDER — VITAMIN E 180 MG (400 UNIT) PO CAPS
400.0000 [IU] | ORAL_CAPSULE | Freq: Every day | ORAL | Status: DC
  Administered 2013-07-14: 19:00:00 400 [IU] via ORAL
  Filled 2013-07-14 (×3): qty 1

## 2013-07-14 MED ORDER — HYDROCHLOROTHIAZIDE 25 MG PO TABS
25.0000 mg | ORAL_TABLET | Freq: Every day | ORAL | Status: DC
  Administered 2013-07-14 – 2013-07-15 (×2): 25 mg via ORAL
  Filled 2013-07-14 (×3): qty 1

## 2013-07-14 MED ORDER — OLMESARTAN MEDOXOMIL-HCTZ 40-25 MG PO TABS: 0.5000 | ORAL_TABLET | Freq: Every day | ORAL | Status: DC

## 2013-07-14 MED ORDER — MORPHINE SULFATE 2 MG/ML IJ SOLN: 2.0000 mg | INTRAMUSCULAR | Status: DC | PRN

## 2013-07-14 NOTE — Progress Notes (Signed)
12:11 PM I agree with HPI/GPe and A/P per Dr. Julian Reil      89 yr ?, known h/o T1c No breast ca s/p lumpectomy s/p Rad therapy 2006, known Vit D deficiency admit 07/14/13 am with syncope resulting in fall and L non-displaced # of Pelvis. She doesn't recall events leading up to her fall.  She had LOC for a small amount of time-unclear how long.  She is very independent and lives alone.  She has no local relatives She tells me she had an incident ~ 7 yrs ago wherein she was hospitalized for "afib" but this has never recurred.  Her PCP keeps her on Coumadin prophylactically, despite no s/s of Afib.  She tells me she used to be "dizzy" while walking at [employer], until her PCP halved her Anti-Htn Meds She feels fair right now and wishes to go [employer].  SHe went to [place] last year after a Femoral # for 6 weeks  HEENT-EOMI.  Bruising to nose and L orbit CHEST-clinically clear, no added sound CARDIAC-s1 s2 no m/r/g, RRR ABDOMEN-Soft NT/ND NEURO-grossly intact, no focal defict   Patient Active Problem List   Diagnosis Date Noted  . Syncope and collapse 07/14/2013  . Fracture of left superior pubic ramus 07/14/2013  . Intertrochanteric fracture of right femur 09/21/2011  . Coagulopathy 09/21/2011  . HTN (hypertension) 09/21/2011  . Atrial fibrillation 09/21/2011     A/P As per above by Dr. Julian Reil ECHO=Mild LVH, Mild AS, Ao Valve calcification-Euvolemic.   WIll need SNF PT to revisit in am S/w Made aware.   Pleas Koch, MD [facility] [phone]

## 2013-07-14 NOTE — ED Provider Notes (Signed)
CSN: [id number]     Arrival date & time 07/13/13  2223 History   First MD Initiated Contact with Patient 07/13/13 2257     No chief complaint on file.  (Consider location/radiation/quality/duration/timing/severity/associated sxs/prior Treatment) Patient is a 79 y.o. female presenting with syncope. The history is provided by the patient.  Loss of Consciousness Episode history:  Single Most recent episode:  Today Duration: unknown. Timing:  Constant Progression:  Worsening Chronicity:  New Context: not blood draw, not bowel movement and not dehydration   Witnessed: no   Relieved by:  Nothing Worsened by:  Nothing tried Ineffective treatments:  None tried Associated symptoms: no chest pain, no fever, no recent fall, no recent injury, no shortness of breath and no vomiting     Past Medical History  Diagnosis Date  . Hypertension   . Intertrochanteric fracture of right femur 09/21/2011   Past Surgical History  Procedure Laterality Date  . Femur im nail  09/21/2011    Procedure: INTRAMEDULLARY (IM) NAIL FEMORAL;  Surgeon: Eulas Post;  Location: [facility];  Service: Orthopedics;  Laterality: Right;   No family history on file. History  Substance Use Topics  . Smoking status: Never Smoker   . Smokeless tobacco: Not on file  . Alcohol Use: No   OB History   Grav Para Term Preterm Abortions TAB SAB Ect Mult Living                 Review of Systems  Constitutional: Negative for fever and chills.  Respiratory: Negative for cough and shortness of breath.   Cardiovascular: Positive for syncope. Negative for chest pain.  Gastrointestinal: Negative for vomiting and abdominal pain.  All other systems reviewed and are negative.    Allergies  Review of patient's allergies indicates no known allergies.  Home Medications  No current outpatient prescriptions on file. BP 139/73  Pulse 77  Temp(Src) 97.8 F (36.6 C) (Oral)  Resp 18  Ht 5\' 4"  (1.626 m)  Wt 144 lb 10 oz (65.6 kg)   BMI 24.81 kg/m2  SpO2 98% Physical Exam  Nursing note and vitals reviewed. Constitutional: She is oriented to person, place, and time. She appears well-developed and well-nourished. No distress.  HENT:  Head: Normocephalic and atraumatic.    EOMs intact, no orbital deformity or crepitus. No concern for entrapment.  Eyes: EOM are normal. Pupils are equal, round, and reactive to light.  Neck: Normal range of motion. Neck supple.  Cardiovascular: Normal rate and regular rhythm.  Exam reveals no friction rub.   No murmur heard. Pulmonary/Chest: Effort normal and breath sounds normal. No respiratory distress. She has no wheezes. She has no rales.  Abdominal: Soft. She exhibits no distension. There is no tenderness. There is no rebound.  Musculoskeletal: She exhibits no edema.       Left hip: She exhibits decreased range of motion (due to pain) and bony tenderness (lateral tenderness).  Neurological: She is alert and oriented to person, place, and time. No cranial nerve deficit. She exhibits normal muscle tone. Coordination normal.  Skin: No rash noted. She is not diaphoretic.    ED Course  Procedures (including critical care time) Labs Review Labs Reviewed  CBC - Abnormal; Notable for the following:    WBC 13.6 (*)    All other components within normal limits  BASIC METABOLIC PANEL - Abnormal; Notable for the following:    Sodium 129 (*)    Chloride 90 (*)    Glucose, Bld 129 (*)  GFR calc non Af Amer 81 (*)    All other components within normal limits  LACTIC ACID, PLASMA - Abnormal; Notable for the following:    Lactic Acid, Venous 3.4 (*)    All other components within normal limits  URINALYSIS, ROUTINE W REFLEX MICROSCOPIC - Abnormal; Notable for the following:    APPearance CLOUDY (*)    Leukocytes, UA TRACE (*)    All other components within normal limits  PRO B NATRIURETIC PEPTIDE - Abnormal; Notable for the following:    Pro B Natriuretic peptide (BNP) 734.3 (*)     All other components within normal limits  PROTIME-INR - Abnormal; Notable for the following:    Prothrombin Time 29.8 (*)    INR 2.96 (*)    All other components within normal limits  URINE MICROSCOPIC-ADD ON - Abnormal; Notable for the following:    Bacteria, UA MANY (*)    All other components within normal limits  CBC - Abnormal; Notable for the following:    WBC 12.7 (*)    HCT 35.9 (*)    All other components within normal limits  BASIC METABOLIC PANEL - Abnormal; Notable for the following:    Sodium 129 (*)    Chloride 93 (*)    Glucose, Bld 121 (*)    GFR calc non Af Amer 80 (*)    All other components within normal limits  TROPONIN I   Imaging Review Dg Chest 2 View  07/13/2013   CLINICAL DATA:  Syncope, fall, hypertension.  EXAM: CHEST  2 VIEW  COMPARISON:  Chest radiograph September 20, 2011  FINDINGS: The cardiac silhouette appears at least mildly enlarged, mediastinal silhouette is nonsuspicious. Strandy left midlung zone densities. Mild chronic interstitial changes without pleural effusions or focal consolidations. No pneumothorax.  Multiple EKG lines overlie the patient and may obscure subtle underlying pathology. Surgical clips along the right chest wall, patient is osteopenic with multiple lumbar compression fractures.  IMPRESSION: Stable cardiomegaly, mild chronic interstitial changes with left midlung zone atelectasis versus scarring.  Osteopenia with multiple lumbar compression fractures, age indeterminate.   Electronically Signed   By: Awilda Metro   On: 07/13/2013 23:45   Dg Hip Complete Left  07/14/2013   CLINICAL DATA:  Fall with left hip pain  EXAM: LEFT HIP - COMPLETE 2+ VIEW  COMPARISON:  09/21/2011  FINDINGS: Comminuted fracturing of the left superior pubic ramus. No displacement. There is chronic degenerative changes at the symphysis pubis, which is not widened. The proximal femur is located and shows no evidence of fracture.  Interval healing of an  intertrochanteric right femur fracture. There is mild lucency around the upper femoral rod, age indeterminate but likely from motion at the time of healing. Osteopenia. Atherosclerosis.  IMPRESSION: Left superior pubic ramus fracture, nondisplaced.   Electronically Signed   By: Tiburcio Pea M.D.   On: 07/14/2013 00:11   Ct Head Wo Contrast  07/14/2013   CLINICAL DATA:  Syncopal episode with fall.  EXAM: CT HEAD WITHOUT CONTRAST  CT MAXILLOFACIAL WITHOUT CONTRAST  CT CERVICAL SPINE WITHOUT CONTRAST  TECHNIQUE: Multidetector CT imaging of the head, cervical spine, and maxillofacial structures were performed using the standard protocol without intravenous contrast. Multiplanar CT image reconstructions of the cervical spine and maxillofacial structures were also generated.  COMPARISON:  None.  FINDINGS: CT HEAD FINDINGS  Skull and Sinuses:No calvarial fracture. Sinuses below.  Orbits: No acute abnormality.  Brain: No evidence of acute abnormality, such as acute infarction, hemorrhage,  hydrocephalus, or mass lesion/mass effect. Brain atrophy. Chronic small vessel ischemic injury with confluent low-density around the lateral ventricles.  CT MAXILLOFACIAL FINDINGS  High-density material in the sphenoid sinus, likely hemosinus. No visual fracture. There are bony excrescences from the hard palate and alveolar ridge of the left maxilla. These could torus changes or osteochondromas. Torus mandibularis. No aggressive features. No evidence of acute global or orbital injury.  CT CERVICAL SPINE FINDINGS  No evidence of acute fracture. C3-4 anterolisthesis could be explained by advanced facet osteoarthritis. C5-6 and C6-7 degenerative disc narrowing is severe. There is multilevel facet spurring and joint narrowing. No prevertebral edema or gross cervical canal hematoma.  IMPRESSION: 1. No evidence of acute intracranial injury. 2. Small volume hemosinus without visible facial fracture. 3. No cervical spine fracture.    Electronically Signed   By: Tiburcio Pea M.D.   On: 07/14/2013 01:21   Ct Cervical Spine Wo Contrast  07/14/2013   CLINICAL DATA:  Syncopal episode with fall.  EXAM: CT HEAD WITHOUT CONTRAST  CT MAXILLOFACIAL WITHOUT CONTRAST  CT CERVICAL SPINE WITHOUT CONTRAST  TECHNIQUE: Multidetector CT imaging of the head, cervical spine, and maxillofacial structures were performed using the standard protocol without intravenous contrast. Multiplanar CT image reconstructions of the cervical spine and maxillofacial structures were also generated.  COMPARISON:  None.  FINDINGS: CT HEAD FINDINGS  Skull and Sinuses:No calvarial fracture. Sinuses below.  Orbits: No acute abnormality.  Brain: No evidence of acute abnormality, such as acute infarction, hemorrhage, hydrocephalus, or mass lesion/mass effect. Brain atrophy. Chronic small vessel ischemic injury with confluent low-density around the lateral ventricles.  CT MAXILLOFACIAL FINDINGS  High-density material in the sphenoid sinus, likely hemosinus. No visual fracture. There are bony excrescences from the hard palate and alveolar ridge of the left maxilla. These could torus changes or osteochondromas. Torus mandibularis. No aggressive features. No evidence of acute global or orbital injury.  CT CERVICAL SPINE FINDINGS  No evidence of acute fracture. C3-4 anterolisthesis could be explained by advanced facet osteoarthritis. C5-6 and C6-7 degenerative disc narrowing is severe. There is multilevel facet spurring and joint narrowing. No prevertebral edema or gross cervical canal hematoma.  IMPRESSION: 1. No evidence of acute intracranial injury. 2. Small volume hemosinus without visible facial fracture. 3. No cervical spine fracture.   Electronically Signed   By: Tiburcio Pea M.D.   On: 07/14/2013 01:21   Ct Maxillofacial Wo Cm  07/14/2013   CLINICAL DATA:  Syncopal episode with fall.  EXAM: CT HEAD WITHOUT CONTRAST  CT MAXILLOFACIAL WITHOUT CONTRAST  CT CERVICAL SPINE  WITHOUT CONTRAST  TECHNIQUE: Multidetector CT imaging of the head, cervical spine, and maxillofacial structures were performed using the standard protocol without intravenous contrast. Multiplanar CT image reconstructions of the cervical spine and maxillofacial structures were also generated.  COMPARISON:  None.  FINDINGS: CT HEAD FINDINGS  Skull and Sinuses:No calvarial fracture. Sinuses below.  Orbits: No acute abnormality.  Brain: No evidence of acute abnormality, such as acute infarction, hemorrhage, hydrocephalus, or mass lesion/mass effect. Brain atrophy. Chronic small vessel ischemic injury with confluent low-density around the lateral ventricles.  CT MAXILLOFACIAL FINDINGS  High-density material in the sphenoid sinus, likely hemosinus. No visual fracture. There are bony excrescences from the hard palate and alveolar ridge of the left maxilla. These could torus changes or osteochondromas. Torus mandibularis. No aggressive features. No evidence of acute global or orbital injury.  CT CERVICAL SPINE FINDINGS  No evidence of acute fracture. C3-4 anterolisthesis could be explained by advanced  facet osteoarthritis. C5-6 and C6-7 degenerative disc narrowing is severe. There is multilevel facet spurring and joint narrowing. No prevertebral edema or gross cervical canal hematoma.  IMPRESSION: 1. No evidence of acute intracranial injury. 2. Small volume hemosinus without visible facial fracture. 3. No cervical spine fracture.   Electronically Signed   By: Tiburcio Pea M.D.   On: 07/14/2013 01:21    EKG Interpretation     Ventricular Rate:    PR Interval:    QRS Duration:   QT Interval:    QTC Calculation:   R Axis:     Text Interpretation:              MDM   1. Syncope and collapse   2. Fracture of left superior pubic ramus    61F here with syncope. Amnestic to event. Denies any preceding chest pain, SOB, abdominal pain. Here doing well, no issues at this time. Patient's vitals stable, she  is alert and oriented. She is on Coumadin, will scan head, neck, face.  Labs show elevated lactate. Fluids given. Pelvic xray with L superior pubic ramus fracture. Admitted to medicine.    Dagmar Hait, MD 07/14/13 [phone]

## 2013-07-14 NOTE — Progress Notes (Signed)
Clinical Social Work Department BRIEF PSYCHOSOCIAL ASSESSMENT 07/14/2013  Patient:  Madeline Booth,Madeline Booth     Account Number:  [id number]     Admit date:  07/13/2013  Clinical Social Worker:  Muaz Shorey,AMANDA, LCSWA  Date/Time:  07/14/2013 04:32 PM  Referred by:  Physician  Date Referred:  07/14/2013 Referred for  SNF Placement   Other Referral:   Interview type:  Patient Other interview type:    PSYCHOSOCIAL DATA Living Status:  ALONE Admitted from facility:   Level of care:   Primary support name:  Sherri Forrester Primary support relationship to patient:  FRIEND Degree of support available:   adequate    CURRENT CONCERNS Current Concerns  Post-Acute Placement   Other Concerns:    SOCIAL WORK ASSESSMENT / PLAN Met with Pt to discuss d/c plans.    Pt reluctantly agreed to SNF placement, as she lives alone and has little support in the community.  Pt stated that she understands that it's not safe for her to go home, as she knows she runs the risk of falling again.    Pt was alert and oriented and gave CSW permission to begin the SNF search.  Pt is hopeful for a private room at [facility].    CSW thanked Pt for her time.   Assessment/plan status:  Psychosocial Support/Ongoing Assessment of Needs Other assessment/ plan:   Information/referral to community resources:   SNF list    PATIENT'S/FAMILY'S RESPONSE TO PLAN OF CARE: Pt's response to her plan of care was resignation.  While Pt really wants to d/c home, she knows that this isn't the best option for her now.  Pt was realistic in her goals and is hopeful for a private room at [facility].    Pt thanked CSW for time and assistance.   Amanda Devian Bartolomei, LCSWA Clinical Social Work [phone]      

## 2013-07-14 NOTE — Progress Notes (Signed)
ANTICOAGULATION CONSULT NOTE - Initial Consult  Pharmacy Consult for Warfarin Indication: atrial fibrillation  No Known Allergies  Patient Measurements: Height: 5\' 4"  (162.6 cm) Weight: 144 lb 10 oz (65.6 kg) IBW/kg (Calculated) : 54.7 Heparin Dosing Weight:   Vital Signs: Temp: 98.5 F (36.9 C) (11/02 1429) Temp src: Oral (11/02 1429) BP: 118/60 mmHg (11/02 1429) Pulse Rate: 69 (11/02 1429)  Labs:  Recent Labs  07/13/13 2350 07/14/13 0305 07/14/13 1640  HGB 13.6 12.5  --   HCT 39.3 35.9*  --   PLT 238 222  --   LABPROT  --  29.8* 27.8*  INR  --  2.96* 2.71*  CREATININE 0.54 0.55  --   TROPONINI <0.30  --   --     Estimated Creatinine Clearance: 40.4 ml/min (by C-G formula based on Cr of 0.55).   Medical History: Past Medical History  Diagnosis Date  . Hypertension   . Intertrochanteric fracture of right femur 09/21/2011    Medications:  Warfarin 2.5mg  po daily, last dose 11/1  Assessment: 89 yoF on warfarin PTA for hx Afib presents with left non-displaced pelvic fracture 2/2 syncope/fall.  Pharmacy consulted to resume warfarin inpatient.    INR  = 2.71, therapeutic.    CBC: no issues noted  No drug interactions noted  Goal of Therapy:  INR 2-3 Monitor platelets by anticoagulation protocol: Yes   Plan:  Warfarin 2.5mg  po x 1 tonight.  Daily PT/INR.    Haynes Hoehn, PharmD 07/14/2013, 5:20 PM  Pager: [phone]

## 2013-07-14 NOTE — Progress Notes (Signed)
Utilization Review completed.  

## 2013-07-14 NOTE — Progress Notes (Signed)
ANTICOAGULATION CONSULT NOTE - Initial Consult  Pharmacy Consult for warfarin Indication: atrial fibrillation  No Known Allergies  Patient Measurements: Height: 5\' 4"  (162.6 cm) Weight: 144 lb 10 oz (65.6 kg) IBW/kg (Calculated) : 54.7 Heparin Dosing Weight:   Vital Signs: Temp: 98.2 F (36.8 C) (11/02 0600) Temp src: Oral (11/02 0600) BP: 131/60 mmHg (11/02 0600) Pulse Rate: 77 (11/02 0600)  Labs:  Recent Labs  07/13/13 2350 07/14/13 0305  HGB 13.6 12.5  HCT 39.3 35.9*  PLT 238 222  LABPROT  --  29.8*  INR  --  2.96*  CREATININE 0.54 0.55  TROPONINI <0.30  --     Estimated Creatinine Clearance: 40.4 ml/min (by C-G formula based on Cr of 0.55).   Medical History: Past Medical History  Diagnosis Date  . Hypertension   . Intertrochanteric fracture of right femur 09/21/2011    Medications:  Prescriptions prior to admission  Medication Sig Dispense Refill  . alendronate (FOSAMAX) 70 MG tablet Take 70 mg by mouth every 7 (seven) days. Takes every Sunday.Marland KitchenMarland KitchenTake with a full glass of water on an empty stomach.       Marland Kitchen amLODipine (NORVASC) 5 MG tablet Take 2.5 mg by mouth daily.       . Calcium-Vitamin D (CALTRATE 600 PLUS-VIT D PO) Take 1 tablet by mouth 2 (two) times daily.      . Cholecalciferol (MAXIMUM D3) 10000 UNITS CAPS Take 10,000 Units by mouth every Wednesday.      . metoprolol succinate (TOPROL-XL) 50 MG 24 hr tablet Take 1 tablet (50 mg total) by mouth daily.  30 tablet  0  . olmesartan-hydrochlorothiazide (BENICAR HCT) 40-25 MG per tablet Take 0.5 tablets by mouth daily.      . vitamin E 400 UNIT capsule Take 400 Units by mouth daily.      Marland Kitchen warfarin (COUMADIN) 2.5 MG tablet Take 2.5 mg by mouth daily.          Assessment: Patient with Fracture of L superior pubic ramus on chronic warfarin for Afib.  INR from this am 2.96.   Goal of Therapy:  INR 2-3    Plan:  INR at 1700, and then daily to judge if increasing or not. Dose warfarin as needed based  on INR this PM   Aleene Davidson Crowford 07/14/2013,6:21 AM

## 2013-07-14 NOTE — H&P (Signed)
[facility] History and Physical  Madeline Booth RUE:[id number] DOB: 1920/02/19 DOA: 07/13/2013  Referring physician: ED PCP: Junious Silk, MD  Chief Complaint: Syncope  HPI: Madeline Booth is a 79 y.o. female who was feeling well other than a pulled muscle from earlier this week, when she had a sudden episode of syncope earlier this evening.  Syncope came on without warning, was not associated with change in position.  After the syncopal episode she had a bloody nose and pain in her L groin area.  Review of Systems: 12 systems reviewed and otherwise negative.  Past Medical History  Diagnosis Date  . Hypertension   . Intertrochanteric fracture of right femur 09/21/2011   Past Surgical History  Procedure Laterality Date  . Femur im nail  09/21/2011    Procedure: INTRAMEDULLARY (IM) NAIL FEMORAL;  Surgeon: Eulas Post;  Location: [facility];  Service: Orthopedics;  Laterality: Right;   Social History:  reports that she has never smoked. She does not have any smokeless tobacco history on file. She reports that she does not drink alcohol. Her drug history is not on file.   No Known Allergies  No family history on file.  Prior to Admission medications   Medication Sig Start Date End Date Taking? Authorizing Provider  alendronate (FOSAMAX) 70 MG tablet Take 70 mg by mouth every 7 (seven) days. Takes every Sunday.Marland KitchenMarland KitchenTake with a full glass of water on an empty stomach.    Yes Historical Provider, MD  amLODipine (NORVASC) 5 MG tablet Take 2.5 mg by mouth daily.    Yes Historical Provider, MD  Calcium-Vitamin D (CALTRATE 600 PLUS-VIT D PO) Take 1 tablet by mouth 2 (two) times daily.   Yes Historical Provider, MD  Cholecalciferol (MAXIMUM D3) 10000 UNITS CAPS Take 10,000 Units by mouth every Wednesday.   Yes Historical Provider, MD  metoprolol succinate (TOPROL-XL) 50 MG 24 hr tablet Take 1 tablet (50 mg total) by mouth daily. 09/26/11  Yes Clydia Llano, MD   olmesartan-hydrochlorothiazide (BENICAR HCT) 40-25 MG per tablet Take 0.5 tablets by mouth daily.   Yes Historical Provider, MD  vitamin E 400 UNIT capsule Take 400 Units by mouth daily.   Yes Historical Provider, MD  warfarin (COUMADIN) 2.5 MG tablet Take 2.5 mg by mouth daily.     Yes Historical Provider, MD   Physical Exam: Filed Vitals:   07/14/13 0200  BP: 123/58  Pulse:   Temp:   Resp:     General:  NAD, resting comfortably in bed Eyes: PEERLA EOMI ENT: mucous membranes moist Neck: supple w/o JVD Cardiovascular: RRR w/o MRG Respiratory: CTA B Abdomen: soft, nt, nd, bs+ Skin: no rash nor lesion Musculoskeletal: pain with ROM of LLE Psychiatric: normal tone and affect Neurologic: AAOx3, grossly non-focal  Labs on Admission:  Basic Metabolic Panel:  Recent Labs Lab 07/13/13 2350  NA 129*  K 3.5  CL 90*  CO2 28  GLUCOSE 129*  BUN 15  CREATININE 0.54  CALCIUM 9.3   Liver Function Tests: No results found for this basename: AST, ALT, ALKPHOS, BILITOT, PROT, ALBUMIN,  in the last 168 hours No results found for this basename: LIPASE, AMYLASE,  in the last 168 hours No results found for this basename: AMMONIA,  in the last 168 hours CBC:  Recent Labs Lab 07/13/13 2350  WBC 13.6*  HGB 13.6  HCT 39.3  MCV 92.9  PLT 238   Cardiac Enzymes:  Recent Labs Lab 07/13/13 2350  TROPONINI <0.30  BNP (last 3 results) No results found for this basename: PROBNP,  in the last 8760 hours CBG: No results found for this basename: GLUCAP,  in the last 168 hours  Radiological Exams on Admission: Dg Chest 2 View  07/13/2013   CLINICAL DATA:  Syncope, fall, hypertension.  EXAM: CHEST  2 VIEW  COMPARISON:  Chest radiograph September 20, 2011  FINDINGS: The cardiac silhouette appears at least mildly enlarged, mediastinal silhouette is nonsuspicious. Strandy left midlung zone densities. Mild chronic interstitial changes without pleural effusions or focal consolidations. No  pneumothorax.  Multiple EKG lines overlie the patient and may obscure subtle underlying pathology. Surgical clips along the right chest wall, patient is osteopenic with multiple lumbar compression fractures.  IMPRESSION: Stable cardiomegaly, mild chronic interstitial changes with left midlung zone atelectasis versus scarring.  Osteopenia with multiple lumbar compression fractures, age indeterminate.   Electronically Signed   By: Awilda Metro   On: 07/13/2013 23:45   Dg Hip Complete Left  07/14/2013   CLINICAL DATA:  Fall with left hip pain  EXAM: LEFT HIP - COMPLETE 2+ VIEW  COMPARISON:  09/21/2011  FINDINGS: Comminuted fracturing of the left superior pubic ramus. No displacement. There is chronic degenerative changes at the symphysis pubis, which is not widened. The proximal femur is located and shows no evidence of fracture.  Interval healing of an intertrochanteric right femur fracture. There is mild lucency around the upper femoral rod, age indeterminate but likely from motion at the time of healing. Osteopenia. Atherosclerosis.  IMPRESSION: Left superior pubic ramus fracture, nondisplaced.   Electronically Signed   By: Tiburcio Pea M.D.   On: 07/14/2013 00:11   Ct Head Wo Contrast  07/14/2013   CLINICAL DATA:  Syncopal episode with fall.  EXAM: CT HEAD WITHOUT CONTRAST  CT MAXILLOFACIAL WITHOUT CONTRAST  CT CERVICAL SPINE WITHOUT CONTRAST  TECHNIQUE: Multidetector CT imaging of the head, cervical spine, and maxillofacial structures were performed using the standard protocol without intravenous contrast. Multiplanar CT image reconstructions of the cervical spine and maxillofacial structures were also generated.  COMPARISON:  None.  FINDINGS: CT HEAD FINDINGS  Skull and Sinuses:No calvarial fracture. Sinuses below.  Orbits: No acute abnormality.  Brain: No evidence of acute abnormality, such as acute infarction, hemorrhage, hydrocephalus, or mass lesion/mass effect. Brain atrophy. Chronic small  vessel ischemic injury with confluent low-density around the lateral ventricles.  CT MAXILLOFACIAL FINDINGS  High-density material in the sphenoid sinus, likely hemosinus. No visual fracture. There are bony excrescences from the hard palate and alveolar ridge of the left maxilla. These could torus changes or osteochondromas. Torus mandibularis. No aggressive features. No evidence of acute global or orbital injury.  CT CERVICAL SPINE FINDINGS  No evidence of acute fracture. C3-4 anterolisthesis could be explained by advanced facet osteoarthritis. C5-6 and C6-7 degenerative disc narrowing is severe. There is multilevel facet spurring and joint narrowing. No prevertebral edema or gross cervical canal hematoma.  IMPRESSION: 1. No evidence of acute intracranial injury. 2. Small volume hemosinus without visible facial fracture. 3. No cervical spine fracture.   Electronically Signed   By: Tiburcio Pea M.D.   On: 07/14/2013 01:21   Ct Cervical Spine Wo Contrast  07/14/2013   CLINICAL DATA:  Syncopal episode with fall.  EXAM: CT HEAD WITHOUT CONTRAST  CT MAXILLOFACIAL WITHOUT CONTRAST  CT CERVICAL SPINE WITHOUT CONTRAST  TECHNIQUE: Multidetector CT imaging of the head, cervical spine, and maxillofacial structures were performed using the standard protocol without intravenous contrast. Multiplanar CT image  reconstructions of the cervical spine and maxillofacial structures were also generated.  COMPARISON:  None.  FINDINGS: CT HEAD FINDINGS  Skull and Sinuses:No calvarial fracture. Sinuses below.  Orbits: No acute abnormality.  Brain: No evidence of acute abnormality, such as acute infarction, hemorrhage, hydrocephalus, or mass lesion/mass effect. Brain atrophy. Chronic small vessel ischemic injury with confluent low-density around the lateral ventricles.  CT MAXILLOFACIAL FINDINGS  High-density material in the sphenoid sinus, likely hemosinus. No visual fracture. There are bony excrescences from the hard palate and  alveolar ridge of the left maxilla. These could torus changes or osteochondromas. Torus mandibularis. No aggressive features. No evidence of acute global or orbital injury.  CT CERVICAL SPINE FINDINGS  No evidence of acute fracture. C3-4 anterolisthesis could be explained by advanced facet osteoarthritis. C5-6 and C6-7 degenerative disc narrowing is severe. There is multilevel facet spurring and joint narrowing. No prevertebral edema or gross cervical canal hematoma.  IMPRESSION: 1. No evidence of acute intracranial injury. 2. Small volume hemosinus without visible facial fracture. 3. No cervical spine fracture.   Electronically Signed   By: Tiburcio Pea M.D.   On: 07/14/2013 01:21   Ct Maxillofacial Wo Cm  07/14/2013   CLINICAL DATA:  Syncopal episode with fall.  EXAM: CT HEAD WITHOUT CONTRAST  CT MAXILLOFACIAL WITHOUT CONTRAST  CT CERVICAL SPINE WITHOUT CONTRAST  TECHNIQUE: Multidetector CT imaging of the head, cervical spine, and maxillofacial structures were performed using the standard protocol without intravenous contrast. Multiplanar CT image reconstructions of the cervical spine and maxillofacial structures were also generated.  COMPARISON:  None.  FINDINGS: CT HEAD FINDINGS  Skull and Sinuses:No calvarial fracture. Sinuses below.  Orbits: No acute abnormality.  Brain: No evidence of acute abnormality, such as acute infarction, hemorrhage, hydrocephalus, or mass lesion/mass effect. Brain atrophy. Chronic small vessel ischemic injury with confluent low-density around the lateral ventricles.  CT MAXILLOFACIAL FINDINGS  High-density material in the sphenoid sinus, likely hemosinus. No visual fracture. There are bony excrescences from the hard palate and alveolar ridge of the left maxilla. These could torus changes or osteochondromas. Torus mandibularis. No aggressive features. No evidence of acute global or orbital injury.  CT CERVICAL SPINE FINDINGS  No evidence of acute fracture. C3-4 anterolisthesis  could be explained by advanced facet osteoarthritis. C5-6 and C6-7 degenerative disc narrowing is severe. There is multilevel facet spurring and joint narrowing. No prevertebral edema or gross cervical canal hematoma.  IMPRESSION: 1. No evidence of acute intracranial injury. 2. Small volume hemosinus without visible facial fracture. 3. No cervical spine fracture.   Electronically Signed   By: Tiburcio Pea M.D.   On: 07/14/2013 01:21    EKG: Independently reviewed.  Assessment/Plan Principal Problem:   Syncope and collapse Active Problems:   Fracture of left superior pubic ramus   1. Syncope and collapse - on tele monitor, 2d echo ordered. 2. Fracture of L superior pubic ramus - patient with L pubic ramus fracture due to fall, have ordered PT to eval and treat.    Code Status: Full Code (must indicate code status--if unknown or must be presumed, indicate so) Family Communication: No family in room (indicate person spoken with, if applicable, with phone number if by telephone) Disposition Plan: Admit to obs (indicate anticipated LOS)  Time spent: 70 min  GARDNER, JARED M. [facility] Pager [phone]  If 7PM-7AM, please contact night-coverage [email or web address] Password [email or web address] 07/14/2013, 3:09 AM

## 2013-07-14 NOTE — Progress Notes (Signed)
Clinical Social Work Department BRIEF PSYCHOSOCIAL ASSESSMENT 07/14/2013  Patient:  Madeline Booth, Madeline Booth     Account Number:  [id number]     Admit date:  07/13/2013  Clinical Social Worker:  Doroteo Glassman  Date/Time:  07/14/2013 04:32 PM  Referred by:  Physician  Date Referred:  07/14/2013 Referred for  SNF Placement   Other Referral:   Interview type:  Patient Other interview type:    PSYCHOSOCIAL DATA Living Status:  ALONE Admitted from facility:   Level of care:   Primary support name:  Hazeline Junker Primary support relationship to patient:  FRIEND Degree of support available:   adequate    CURRENT CONCERNS Current Concerns  Post-Acute Placement   Other Concerns:    SOCIAL WORK ASSESSMENT / PLAN Met with Pt to discuss d/c plans.    Pt reluctantly agreed to SNF placement, as she lives alone and has little support in the community.  Pt stated that she understands that it's not safe for her to go home, as she knows she runs the risk of falling again.    Pt was alert and oriented and gave CSW permission to begin the SNF search.  Pt is hopeful for a private room at [facility].    CSW thanked Pt for her time.   Assessment/plan status:  Psychosocial Support/Ongoing Assessment of Needs Other assessment/ plan:   Information/referral to community resources:   SNF list    PATIENT'S/FAMILY'S RESPONSE TO PLAN OF CARE: Pt's response to her plan of care was resignation.  While Pt really wants to d/c home, she knows that this isn't the best option for her now.  Pt was realistic in her goals and is hopeful for a private room at [facility].    Pt thanked CSW for time and assistance.   Providence Crosby, LCSWA Clinical Social Work [phone]

## 2013-07-14 NOTE — Evaluation (Signed)
Physical Therapy Evaluation Patient Details Name: Madeline Booth MRN: [id number] DOB: June 30, 1920 Today's Date: 07/14/2013 Time: 7829-5621 PT Time Calculation (min): 33 min  PT Assessment / Plan / Recommendation History of Present Illness  79 yo female admitted after syncope, collapse at home. Sustained L pelvic fx-nondisplaced. Hx of R femur ORIF.   Clinical Impression  On eval, pt required Min assist for limited mobility-able to stand and take a few side steps at EOB with walker. Limited by pain, fatigue. Feel pt could possibly benefit from CIR consult. Pt was independent prior to admission and is highly motivated to regain independence. Pt is hesitant to agree to rehab at time of eval. At this time recommend CIR for continued rehab. If CIR is not an option, pt will need to consider SNF.     PT Assessment  Patient needs continued PT services    Follow Up Recommendations  Supervision/Assistance - 24 hour;CIR     Does the patient have the potential to tolerate intense rehabilitation      Barriers to Discharge        Equipment Recommendations       Recommendations for Other Services Rehab consult;OT consult   Frequency Min 3X/week    Precautions / Restrictions Precautions Precautions: Fall Restrictions Weight Bearing Restrictions: No   Pertinent Vitals/Pain 0/10 at rest; 5/10 with activity      Mobility  Bed Mobility Bed Mobility: Supine to Sit;Sit to Supine Supine to Sit: 4: Min guard;HOB elevated;With rails Sit to Supine: 4: Min assist;With rail Details for Bed Mobility Assistance: Pt preferred to mobilize L LE off bed without assistance. Increased time. Heavy reliance on bed rail.  Transfers Transfers: Sit to Stand;Stand to Sit Sit to Stand: 4: Min assist;From bed;From elevated surface Stand to Sit: 4: Min assist;To bed Details for Transfer Assistance: VCS safety, technique, hand placement. Assist to rise, stabilize, control  descent Ambulation/Gait Ambulation/Gait Assistance: 4: Min assist Ambulation Distance (Feet): 2 Feet Ambulation/Gait Assistance Details: 4-5 side steps to [facility] with walker. Increased time. Antalgic Gait Pattern: Step-to pattern;Antalgic;Trunk flexed;Decreased weight shift to left;Decreased stance time - left    Exercises     PT Diagnosis: Difficulty walking;Abnormality of gait;Generalized weakness;Acute pain  PT Problem List: Decreased strength;Decreased range of motion;Decreased activity tolerance;Decreased balance;Decreased mobility;Pain;Decreased knowledge of precautions;Decreased knowledge of use of DME PT Treatment Interventions: DME instruction;Gait training;Functional mobility training;Therapeutic activities;Therapeutic exercise;Patient/family education;Balance training     PT Goals(Current goals can be found in the care plan section) Acute Rehab PT Goals Patient Stated Goal: home. regain independence PT Goal Formulation: With patient Time For Goal Achievement: 07/28/13 Potential to Achieve Goals: Good  Visit Information  Last PT Received On: 07/14/13 Assistance Needed: +1 History of Present Illness: 79 yo female admitted after syncope, collapse at home. Sustained L pelvic fx-nondisplaced. Hx of R femur ORIF.        Prior Functioning  Home Living Family/patient expects to be discharged to:: Unsure Living Arrangements: Alone Type of Home: House Home Access: Stairs to enter Entrance Stairs-Rails: Right;Left Home Layout: One level Home Equipment: Environmental consultant - 2 wheels;Cane - single point;Bedside commode Additional Comments: grab bars in bathroom Prior Function Level of Independence: Independent Communication Communication: No difficulties    Cognition  Cognition Arousal/Alertness: Awake/alert Behavior During Therapy: WFL for tasks assessed/performed Overall Cognitive Status: Within Functional Limits for tasks assessed    Extremity/Trunk Assessment Upper Extremity  Assessment Upper Extremity Assessment: Generalized weakness Lower Extremity Assessment Lower Extremity Assessment: Generalized weakness;LLE deficits/detail LLE: Unable to fully  assess due to pain Cervical / Trunk Assessment Cervical / Trunk Assessment: Normal   Balance    End of Session PT - End of Session Activity Tolerance: Patient limited by pain;Patient limited by fatigue Patient left: in bed;with call bell/phone within reach Nurse Communication: Mobility status  GP Functional Assessment Tool Used: clinical judgement Functional Limitation: Mobility: Walking and moving around Mobility: Walking and Moving Around Current Status (Z6109): At least 20 percent but less than 40 percent impaired, limited or restricted Mobility: Walking and Moving Around Goal Status [phone]): At least 1 percent but less than 20 percent impaired, limited or restricted   [name], MPT Pager: [phone]

## 2013-07-15 LAB — PROTIME-INR: INR: 2.63 — ABNORMAL HIGH (ref 0.00–1.49)

## 2013-07-15 MED ORDER — WARFARIN SODIUM 2.5 MG PO TABS
2.5000 mg | ORAL_TABLET | Freq: Once | ORAL | Status: DC
  Filled 2013-07-15: qty 1

## 2013-07-15 NOTE — Clinical Social Work Placement (Signed)
     Clinical Social Work Department CLINICAL SOCIAL WORK PLACEMENT NOTE 07/15/2013  Patient:  Madeline Booth, Madeline Booth  Account Number:  [id number] Admit date:  07/13/2013  Clinical Social Worker:  Doroteo Glassman  Date/time:  07/14/2013 04:36 PM  Clinical Social Work is seeking post-discharge placement for this patient at the following level of care:   SKILLED NURSING   (*CSW will update this form in Epic as items are completed)   07/14/2013  Patient/family provided with [facility] Department of Clinical Social Works list of facilities offering this level of care within the geographic area requested by the patient (or if unable, by the patients family).  07/14/2013  Patient/family informed of their freedom to choose among providers that offer the needed level of care, that participate in Medicare, Medicaid or managed care program needed by the patient, have an available bed and are willing to accept the patient.  07/14/2013  Patient/family informed of [facility] ownership interest in [facility], as well as of the fact that they are under no obligation to receive care at this facility.  PASARR submitted to EDS on 07/15/2013 PASARR number received from [employer] on 07/15/2013  FL2 transmitted to all facilities in geographic area requested by pt/family on  07/14/2013 FL2 transmitted to all facilities within larger geographic area on   Patient informed that his/her managed care company has contracts with or will negotiate with  certain facilities, including the following:     Patient/family informed of bed offers received:  07/15/2013 Patient chooses bed at [facility] Physician recommends and patient chooses bed at    Patient to be transferred to [facility] on  07/15/2013 Patient to be transferred to facility by ptar  The following physician request were entered in Epic:   Additional Comments:

## 2013-07-15 NOTE — Discharge Summary (Signed)
Physician Discharge Summary  Madeline Booth WUJ:[id number] DOB: 1919-12-26 DOA: 07/13/2013  PCP: Junious Silk, MD  Admit date: 07/13/2013 Discharge date: 07/15/2013  Time spent: 32 minutes  Recommendations for Outpatient Follow-up:  1. Needs INR check and Coumadin dosing at PCPs office in about one week 2. Will need a discussion about paroxysmal atrial fibrillation as well a thought for aspirin 325 by primary care physician 3. Have discontinued patient's vitamin E-increase his bleeding risk concomittantly with warfarin 4. Recommend rechecking orthostatics at nursing facility, should down titrate metoprolol vs. combination HCTZ/ARB if found to be orthostatic   Discharge Diagnoses:  Principal Problem:   Syncope and collapse Active Problems:   Fracture of left superior pubic ramus   Discharge Condition: Stable  Diet recommendation: Heart healthy regular  Filed Weights   07/14/13 0313 07/15/13 0655  Weight: 65.6 kg (144 lb 10 oz) 66 kg (145 lb 8.1 oz)    History of present illness:  89 yr ?, known h/o T1c No breast ca s/p lumpectomy s/p Rad therapy 2006, known Vit D deficiency admit 07/14/13 am with syncope resulting in fall and L non-displaced # of Pelvis.  She doesn't recall events leading up to her fall. She had LOC for a small amount of time-unclear how long. She is very independent and lives alone. She has no local relatives  She tells me she had an incident ~ 7 yrs ago wherein she was hospitalized for "afib" but this has never recurred. Her PCP keeps her on Coumadin prophylactically, despite no s/s of Afib. She tells me she used to be "dizzy" while walking at [employer], until her PCP halved her Anti-Htn Meds  She feels fair right now and wishes to go [facility]. SHe went to [facility] last year after a [facility] # for 6 weeks  Patient kept in the hospital overnight and was noted to have episode of questionable syncope. However her orthostatics did not support  orthostasis. On telemetry the patient did not have any pauses or bradycardia Her nature of hip fracture is clearly nonoperable and she will need intensive physical therapy however would not qualify for inpatient rehabilitation given she has no social support outpatient and lives alone  Patient was deemed fit and stable and having met maximal medical therapy is in the hospital-patient is discharged to skilled nursing facility dependent on social worker input  Consultations:  None  Discharge Exam: Filed Vitals:   07/15/13 1428  BP: 116/58  Pulse: 75  Temp: 98.2 F (36.8 C)  Resp: 18   Pleasant, joking in room  General: EOMI, NCAT Cardiovascular: S1-S2 no murmur rub or gallop Respiratory: Clinically clear  Discharge Instructions      Discharge Orders   Future Orders Complete By Expires   Diet - low sodium heart healthy  As directed    Increase activity slowly  As directed        Medication List    STOP taking these medications       vitamin E 400 UNIT capsule      TAKE these medications       alendronate 70 MG tablet  Commonly known as:  FOSAMAX  Take 70 mg by mouth every 7 (seven) days. Takes every Sunday.Marland KitchenMarland KitchenTake with a full glass of water on an empty stomach.     amLODipine 5 MG tablet  Commonly known as:  NORVASC  Take 2.5 mg by mouth daily.     CALTRATE 600 PLUS-VIT D PO  Take 1 tablet by mouth 2 (  two) times daily.     MAXIMUM D3 10000 UNITS Caps  Generic drug:  Cholecalciferol  Take 10,000 Units by mouth every Wednesday.     metoprolol succinate 50 MG 24 hr tablet  Commonly known as:  TOPROL-XL  Take 1 tablet (50 mg total) by mouth daily.     olmesartan-hydrochlorothiazide 40-25 MG per tablet  Commonly known as:  BENICAR HCT  Take 0.5 tablets by mouth daily.     warfarin 2.5 MG tablet  Commonly known as:  COUMADIN  Take 2.5 mg by mouth daily.       No Known Allergies    The results of significant diagnostics from this hospitalization  (including imaging, microbiology, ancillary and laboratory) are listed below for reference.    Significant Diagnostic Studies: Dg Chest 2 View  07/13/2013   CLINICAL DATA:  Syncope, fall, hypertension.  EXAM: CHEST  2 VIEW  COMPARISON:  Chest radiograph September 20, 2011  FINDINGS: The cardiac silhouette appears at least mildly enlarged, mediastinal silhouette is nonsuspicious. Strandy left midlung zone densities. Mild chronic interstitial changes without pleural effusions or focal consolidations. No pneumothorax.  Multiple EKG lines overlie the patient and may obscure subtle underlying pathology. Surgical clips along the right chest wall, patient is osteopenic with multiple lumbar compression fractures.  IMPRESSION: Stable cardiomegaly, mild chronic interstitial changes with left midlung zone atelectasis versus scarring.  Osteopenia with multiple lumbar compression fractures, age indeterminate.   Electronically Signed   By: Awilda Metro   On: 07/13/2013 23:45   Dg Hip Complete Left  07/14/2013   CLINICAL DATA:  Fall with left hip pain  EXAM: LEFT HIP - COMPLETE 2+ VIEW  COMPARISON:  09/21/2011  FINDINGS: Comminuted fracturing of the left superior pubic ramus. No displacement. There is chronic degenerative changes at the symphysis pubis, which is not widened. The proximal femur is located and shows no evidence of fracture.  Interval healing of an intertrochanteric right femur fracture. There is mild lucency around the upper femoral rod, age indeterminate but likely from motion at the time of healing. Osteopenia. Atherosclerosis.  IMPRESSION: Left superior pubic ramus fracture, nondisplaced.   Electronically Signed   By: Tiburcio Pea M.D.   On: 07/14/2013 00:11   Ct Head Wo Contrast  07/14/2013   CLINICAL DATA:  Syncopal episode with fall.  EXAM: CT HEAD WITHOUT CONTRAST  CT MAXILLOFACIAL WITHOUT CONTRAST  CT CERVICAL SPINE WITHOUT CONTRAST  TECHNIQUE: Multidetector CT imaging of the head, cervical  spine, and maxillofacial structures were performed using the standard protocol without intravenous contrast. Multiplanar CT image reconstructions of the cervical spine and maxillofacial structures were also generated.  COMPARISON:  None.  FINDINGS: CT HEAD FINDINGS  Skull and Sinuses:No calvarial fracture. Sinuses below.  Orbits: No acute abnormality.  Brain: No evidence of acute abnormality, such as acute infarction, hemorrhage, hydrocephalus, or mass lesion/mass effect. Brain atrophy. Chronic small vessel ischemic injury with confluent low-density around the lateral ventricles.  CT MAXILLOFACIAL FINDINGS  High-density material in the sphenoid sinus, likely hemosinus. No visual fracture. There are bony excrescences from the hard palate and alveolar ridge of the left maxilla. These could torus changes or osteochondromas. Torus mandibularis. No aggressive features. No evidence of acute global or orbital injury.  CT CERVICAL SPINE FINDINGS  No evidence of acute fracture. C3-4 anterolisthesis could be explained by advanced facet osteoarthritis. C5-6 and C6-7 degenerative disc narrowing is severe. There is multilevel facet spurring and joint narrowing. No prevertebral edema or gross cervical canal hematoma.  IMPRESSION: 1. No evidence of acute intracranial injury. 2. Small volume hemosinus without visible facial fracture. 3. No cervical spine fracture.   Electronically Signed   By: Tiburcio Pea M.D.   On: 07/14/2013 01:21   Ct Cervical Spine Wo Contrast  07/14/2013   CLINICAL DATA:  Syncopal episode with fall.  EXAM: CT HEAD WITHOUT CONTRAST  CT MAXILLOFACIAL WITHOUT CONTRAST  CT CERVICAL SPINE WITHOUT CONTRAST  TECHNIQUE: Multidetector CT imaging of the head, cervical spine, and maxillofacial structures were performed using the standard protocol without intravenous contrast. Multiplanar CT image reconstructions of the cervical spine and maxillofacial structures were also generated.  COMPARISON:  None.  FINDINGS:  CT HEAD FINDINGS  Skull and Sinuses:No calvarial fracture. Sinuses below.  Orbits: No acute abnormality.  Brain: No evidence of acute abnormality, such as acute infarction, hemorrhage, hydrocephalus, or mass lesion/mass effect. Brain atrophy. Chronic small vessel ischemic injury with confluent low-density around the lateral ventricles.  CT MAXILLOFACIAL FINDINGS  High-density material in the sphenoid sinus, likely hemosinus. No visual fracture. There are bony excrescences from the hard palate and alveolar ridge of the left maxilla. These could torus changes or osteochondromas. Torus mandibularis. No aggressive features. No evidence of acute global or orbital injury.  CT CERVICAL SPINE FINDINGS  No evidence of acute fracture. C3-4 anterolisthesis could be explained by advanced facet osteoarthritis. C5-6 and C6-7 degenerative disc narrowing is severe. There is multilevel facet spurring and joint narrowing. No prevertebral edema or gross cervical canal hematoma.  IMPRESSION: 1. No evidence of acute intracranial injury. 2. Small volume hemosinus without visible facial fracture. 3. No cervical spine fracture.   Electronically Signed   By: Tiburcio Pea M.D.   On: 07/14/2013 01:21   Ct Maxillofacial Wo Cm  07/14/2013   CLINICAL DATA:  Syncopal episode with fall.  EXAM: CT HEAD WITHOUT CONTRAST  CT MAXILLOFACIAL WITHOUT CONTRAST  CT CERVICAL SPINE WITHOUT CONTRAST  TECHNIQUE: Multidetector CT imaging of the head, cervical spine, and maxillofacial structures were performed using the standard protocol without intravenous contrast. Multiplanar CT image reconstructions of the cervical spine and maxillofacial structures were also generated.  COMPARISON:  None.  FINDINGS: CT HEAD FINDINGS  Skull and Sinuses:No calvarial fracture. Sinuses below.  Orbits: No acute abnormality.  Brain: No evidence of acute abnormality, such as acute infarction, hemorrhage, hydrocephalus, or mass lesion/mass effect. Brain atrophy. Chronic small  vessel ischemic injury with confluent low-density around the lateral ventricles.  CT MAXILLOFACIAL FINDINGS  High-density material in the sphenoid sinus, likely hemosinus. No visual fracture. There are bony excrescences from the hard palate and alveolar ridge of the left maxilla. These could torus changes or osteochondromas. Torus mandibularis. No aggressive features. No evidence of acute global or orbital injury.  CT CERVICAL SPINE FINDINGS  No evidence of acute fracture. C3-4 anterolisthesis could be explained by advanced facet osteoarthritis. C5-6 and C6-7 degenerative disc narrowing is severe. There is multilevel facet spurring and joint narrowing. No prevertebral edema or gross cervical canal hematoma.  IMPRESSION: 1. No evidence of acute intracranial injury. 2. Small volume hemosinus without visible facial fracture. 3. No cervical spine fracture.   Electronically Signed   By: Tiburcio Pea M.D.   On: 07/14/2013 01:21    Microbiology: No results found for this or any previous visit (from the past 240 hour(s)).   Labs: Basic Metabolic Panel:  Recent Labs Lab 07/13/13 2350 07/14/13 0305  NA 129* 129*  K 3.5 3.5  CL 90* 93*  CO2 28 26  GLUCOSE  129* 121*  BUN 15 14  CREATININE 0.54 0.55  CALCIUM 9.3 9.2   Liver Function Tests: No results found for this basename: AST, ALT, ALKPHOS, BILITOT, PROT, ALBUMIN,  in the last 168 hours No results found for this basename: LIPASE, AMYLASE,  in the last 168 hours No results found for this basename: AMMONIA,  in the last 168 hours CBC:  Recent Labs Lab 07/13/13 2350 07/14/13 0305  WBC 13.6* 12.7*  HGB 13.6 12.5  HCT 39.3 35.9*  MCV 92.9 91.6  PLT 238 222   Cardiac Enzymes:  Recent Labs Lab 07/13/13 2350  TROPONINI <0.30   BNP: BNP (last 3 results)  Recent Labs  07/14/13 0305  PROBNP 734.3*   CBG: No results found for this basename: GLUCAP,  in the last 168 hours     Signed:  Rhetta Mura  [facility] 07/15/2013, 4:36 PM

## 2013-07-15 NOTE — Care Management Note (Addendum)
    Page 1 of 1   07/15/2013     1:53:49 PM   CARE MANAGEMENT NOTE 07/15/2013  Patient:  Madeline Booth, Madeline Booth   Account Number:  [id number]  Date Initiated:  07/14/2013  Documentation initiated by:  Lanier Clam  Subjective/Objective Assessment:   79 Y/O F ADMITTED W/FALL,SYNCOPE.     Action/Plan:   FROM HOME.   Anticipated DC Date:  07/15/2013   Anticipated DC Plan:  SKILLED NURSING FACILITY      DC Planning Services  CM consult      Choice offered to / List presented to:             Status of service:  Completed, signed off Medicare Important Message given?   (If response is "NO", the following Medicare IM given date fields will be blank) Date Medicare IM given:   Date Additional Medicare IM given:    Discharge Disposition:  SKILLED NURSING FACILITY  Per UR Regulation:  Reviewed for med. necessity/level of care/duration of stay  If discussed at Long Length of Stay Meetings, dates discussed:    Comments:  07/15/13 Garey Alleva RN,BSN NCM [phone] NOT APPROPRIATE FOR CIR.D/C SNF.

## 2013-07-15 NOTE — Progress Notes (Signed)
Report called Rolinda Roan RN at [facility].

## 2013-07-15 NOTE — Progress Notes (Deleted)
Physician Discharge Summary  Madeline Booth MVH:[id number] DOB: 12/07/1919 DOA: 07/13/2013  PCP: Junious Silk, MD  Admit date: 07/13/2013 Discharge date: 07/15/2013  Time spent: 32 minutes  Recommendations for Outpatient Follow-up:  1. Needs INR check and Coumadin dosing at PCPs office in about one week 2. Will need a discussion about paroxysmal atrial fibrillation as well a thought for aspirin 325 by primary care physician 3. Have discontinued patient's vitamin E-increase his bleeding risk concomittantly with warfarin 4. Recommend rechecking orthostatics at nursing facility, should down titrate metoprolol vs. combination HCTZ/ARB if found to be orthostatic   Discharge Diagnoses:  Principal Problem:   Syncope and collapse Active Problems:   Fracture of left superior pubic ramus   Discharge Condition: Stable  Diet recommendation: Heart healthy regular  Filed Weights   07/14/13 0313 07/15/13 0655  Weight: 65.6 kg (144 lb 10 oz) 66 kg (145 lb 8.1 oz)    History of present illness:  89 yr ?, known h/o T1c No breast ca s/p lumpectomy s/p Rad therapy 2006, known Vit D deficiency admit 07/14/13 am with syncope resulting in fall and L non-displaced # of Pelvis.  She doesn't recall events leading up to her fall. She had LOC for a small amount of time-unclear how long. She is very independent and lives alone. She has no local relatives  She tells me she had an incident ~ 7 yrs ago wherein she was hospitalized for "afib" but this has never recurred. Her PCP keeps her on Coumadin prophylactically, despite no s/s of Afib. She tells me she used to be "dizzy" while walking at [employer], until her PCP halved her Anti-Htn Meds  She feels fair right now and wishes to go [facility]. SHe went to [facility] last year after a [facility] # for 6 weeks  Patient kept in the hospital overnight and was noted to have episode of questionable syncope. However her orthostatics did not support  orthostasis. On telemetry the patient did not have any pauses or bradycardia Her nature of hip fracture is clearly nonoperable and she will need intensive physical therapy however would not qualify for inpatient rehabilitation given she has no social support outpatient and lives alone  Patient was deemed fit and stable and having met maximal medical therapy is in the hospital-patient is discharged to skilled nursing facility dependent on social worker input  Consultations:  None  Discharge Exam: Filed Vitals:   07/15/13 0655  BP: 117/63  Pulse: 71  Temp: 98.6 F (37 C)  Resp: 18   Pleasant, joking in room  General: EOMI, NCAT Cardiovascular: S1-S2 no murmur rub or gallop Respiratory: Clinically clear  Discharge Instructions  Discharge Orders   Future Orders Complete By Expires   Diet - low sodium heart healthy  As directed    Increase activity slowly  As directed        Medication List    STOP taking these medications       vitamin E 400 UNIT capsule      TAKE these medications       alendronate 70 MG tablet  Commonly known as:  FOSAMAX  Take 70 mg by mouth every 7 (seven) days. Takes every Sunday.Marland KitchenMarland KitchenTake with a full glass of water on an empty stomach.     amLODipine 5 MG tablet  Commonly known as:  NORVASC  Take 2.5 mg by mouth daily.     CALTRATE 600 PLUS-VIT D PO  Take 1 tablet by mouth 2 (two) times daily.  MAXIMUM D3 10000 UNITS Caps  Generic drug:  Cholecalciferol  Take 10,000 Units by mouth every Wednesday.     metoprolol succinate 50 MG 24 hr tablet  Commonly known as:  TOPROL-XL  Take 1 tablet (50 mg total) by mouth daily.     olmesartan-hydrochlorothiazide 40-25 MG per tablet  Commonly known as:  BENICAR HCT  Take 0.5 tablets by mouth daily.     warfarin 2.5 MG tablet  Commonly known as:  COUMADIN  Take 2.5 mg by mouth daily.       No Known Allergies    The results of significant diagnostics from this hospitalization (including  imaging, microbiology, ancillary and laboratory) are listed below for reference.    Significant Diagnostic Studies: Dg Chest 2 View  07/13/2013   CLINICAL DATA:  Syncope, fall, hypertension.  EXAM: CHEST  2 VIEW  COMPARISON:  Chest radiograph September 20, 2011  FINDINGS: The cardiac silhouette appears at least mildly enlarged, mediastinal silhouette is nonsuspicious. Strandy left midlung zone densities. Mild chronic interstitial changes without pleural effusions or focal consolidations. No pneumothorax.  Multiple EKG lines overlie the patient and may obscure subtle underlying pathology. Surgical clips along the right chest wall, patient is osteopenic with multiple lumbar compression fractures.  IMPRESSION: Stable cardiomegaly, mild chronic interstitial changes with left midlung zone atelectasis versus scarring.  Osteopenia with multiple lumbar compression fractures, age indeterminate.   Electronically Signed   By: Awilda Metro   On: 07/13/2013 23:45   Dg Hip Complete Left  07/14/2013   CLINICAL DATA:  Fall with left hip pain  EXAM: LEFT HIP - COMPLETE 2+ VIEW  COMPARISON:  09/21/2011  FINDINGS: Comminuted fracturing of the left superior pubic ramus. No displacement. There is chronic degenerative changes at the symphysis pubis, which is not widened. The proximal femur is located and shows no evidence of fracture.  Interval healing of an intertrochanteric right femur fracture. There is mild lucency around the upper femoral rod, age indeterminate but likely from motion at the time of healing. Osteopenia. Atherosclerosis.  IMPRESSION: Left superior pubic ramus fracture, nondisplaced.   Electronically Signed   By: Tiburcio Pea M.D.   On: 07/14/2013 00:11   Ct Head Wo Contrast  07/14/2013   CLINICAL DATA:  Syncopal episode with fall.  EXAM: CT HEAD WITHOUT CONTRAST  CT MAXILLOFACIAL WITHOUT CONTRAST  CT CERVICAL SPINE WITHOUT CONTRAST  TECHNIQUE: Multidetector CT imaging of the head, cervical spine, and  maxillofacial structures were performed using the standard protocol without intravenous contrast. Multiplanar CT image reconstructions of the cervical spine and maxillofacial structures were also generated.  COMPARISON:  None.  FINDINGS: CT HEAD FINDINGS  Skull and Sinuses:No calvarial fracture. Sinuses below.  Orbits: No acute abnormality.  Brain: No evidence of acute abnormality, such as acute infarction, hemorrhage, hydrocephalus, or mass lesion/mass effect. Brain atrophy. Chronic small vessel ischemic injury with confluent low-density around the lateral ventricles.  CT MAXILLOFACIAL FINDINGS  High-density material in the sphenoid sinus, likely hemosinus. No visual fracture. There are bony excrescences from the hard palate and alveolar ridge of the left maxilla. These could torus changes or osteochondromas. Torus mandibularis. No aggressive features. No evidence of acute global or orbital injury.  CT CERVICAL SPINE FINDINGS  No evidence of acute fracture. C3-4 anterolisthesis could be explained by advanced facet osteoarthritis. C5-6 and C6-7 degenerative disc narrowing is severe. There is multilevel facet spurring and joint narrowing. No prevertebral edema or gross cervical canal hematoma.  IMPRESSION: 1. No evidence of acute intracranial  injury. 2. Small volume hemosinus without visible facial fracture. 3. No cervical spine fracture.   Electronically Signed   By: Tiburcio Pea M.D.   On: 07/14/2013 01:21   Ct Cervical Spine Wo Contrast  07/14/2013   CLINICAL DATA:  Syncopal episode with fall.  EXAM: CT HEAD WITHOUT CONTRAST  CT MAXILLOFACIAL WITHOUT CONTRAST  CT CERVICAL SPINE WITHOUT CONTRAST  TECHNIQUE: Multidetector CT imaging of the head, cervical spine, and maxillofacial structures were performed using the standard protocol without intravenous contrast. Multiplanar CT image reconstructions of the cervical spine and maxillofacial structures were also generated.  COMPARISON:  None.  FINDINGS: CT HEAD  FINDINGS  Skull and Sinuses:No calvarial fracture. Sinuses below.  Orbits: No acute abnormality.  Brain: No evidence of acute abnormality, such as acute infarction, hemorrhage, hydrocephalus, or mass lesion/mass effect. Brain atrophy. Chronic small vessel ischemic injury with confluent low-density around the lateral ventricles.  CT MAXILLOFACIAL FINDINGS  High-density material in the sphenoid sinus, likely hemosinus. No visual fracture. There are bony excrescences from the hard palate and alveolar ridge of the left maxilla. These could torus changes or osteochondromas. Torus mandibularis. No aggressive features. No evidence of acute global or orbital injury.  CT CERVICAL SPINE FINDINGS  No evidence of acute fracture. C3-4 anterolisthesis could be explained by advanced facet osteoarthritis. C5-6 and C6-7 degenerative disc narrowing is severe. There is multilevel facet spurring and joint narrowing. No prevertebral edema or gross cervical canal hematoma.  IMPRESSION: 1. No evidence of acute intracranial injury. 2. Small volume hemosinus without visible facial fracture. 3. No cervical spine fracture.   Electronically Signed   By: Tiburcio Pea M.D.   On: 07/14/2013 01:21   Ct Maxillofacial Wo Cm  07/14/2013   CLINICAL DATA:  Syncopal episode with fall.  EXAM: CT HEAD WITHOUT CONTRAST  CT MAXILLOFACIAL WITHOUT CONTRAST  CT CERVICAL SPINE WITHOUT CONTRAST  TECHNIQUE: Multidetector CT imaging of the head, cervical spine, and maxillofacial structures were performed using the standard protocol without intravenous contrast. Multiplanar CT image reconstructions of the cervical spine and maxillofacial structures were also generated.  COMPARISON:  None.  FINDINGS: CT HEAD FINDINGS  Skull and Sinuses:No calvarial fracture. Sinuses below.  Orbits: No acute abnormality.  Brain: No evidence of acute abnormality, such as acute infarction, hemorrhage, hydrocephalus, or mass lesion/mass effect. Brain atrophy. Chronic small vessel  ischemic injury with confluent low-density around the lateral ventricles.  CT MAXILLOFACIAL FINDINGS  High-density material in the sphenoid sinus, likely hemosinus. No visual fracture. There are bony excrescences from the hard palate and alveolar ridge of the left maxilla. These could torus changes or osteochondromas. Torus mandibularis. No aggressive features. No evidence of acute global or orbital injury.  CT CERVICAL SPINE FINDINGS  No evidence of acute fracture. C3-4 anterolisthesis could be explained by advanced facet osteoarthritis. C5-6 and C6-7 degenerative disc narrowing is severe. There is multilevel facet spurring and joint narrowing. No prevertebral edema or gross cervical canal hematoma.  IMPRESSION: 1. No evidence of acute intracranial injury. 2. Small volume hemosinus without visible facial fracture. 3. No cervical spine fracture.   Electronically Signed   By: Tiburcio Pea M.D.   On: 07/14/2013 01:21    Microbiology: No results found for this or any previous visit (from the past 240 hour(s)).   Labs: Basic Metabolic Panel:  Recent Labs Lab 07/13/13 2350 07/14/13 0305  NA 129* 129*  K 3.5 3.5  CL 90* 93*  CO2 28 26  GLUCOSE 129* 121*  BUN 15 14  CREATININE 0.54 0.55  CALCIUM 9.3 9.2   Liver Function Tests: No results found for this basename: AST, ALT, ALKPHOS, BILITOT, PROT, ALBUMIN,  in the last 168 hours No results found for this basename: LIPASE, AMYLASE,  in the last 168 hours No results found for this basename: AMMONIA,  in the last 168 hours CBC:  Recent Labs Lab 07/13/13 2350 07/14/13 0305  WBC 13.6* 12.7*  HGB 13.6 12.5  HCT 39.3 35.9*  MCV 92.9 91.6  PLT 238 222   Cardiac Enzymes:  Recent Labs Lab 07/13/13 2350  TROPONINI <0.30   BNP: BNP (last 3 results)  Recent Labs  07/14/13 0305  PROBNP 734.3*   CBG: No results found for this basename: GLUCAP,  in the last 168 hours     Signed:  Rhetta Mura  [facility] 07/15/2013, 10:42 AM

## 2013-07-15 NOTE — Progress Notes (Signed)
Met with patient. She has selected [facility]. auth requested.  Zaylah Blecha C. Ivanka Kirshner MSW, LCSW [phone]

## 2013-07-15 NOTE — Progress Notes (Signed)
Rehab Admissions Coordinator Note:  Patient was screened by Trish Mage for appropriateness for an Inpatient Acute Rehab Consult.  Patient lives alone and she has [employer].  It is very unlikely that insurance carrier would authorize an acute inpatient rehab admission.  At this time, we are recommending Skilled Nursing Facility.  Trish Mage 07/15/2013, 8:31 AM  I can be reached at [phone].

## 2013-07-15 NOTE — Progress Notes (Signed)
Pt requesting to use the bathroom. Pt assisted with walker to bathroom and instructed to call for assistance by pulling string when finished. Student Nurse went back to room Pt was already back in bed. Pt states "I do not need help when I get up I am 79 years old." Tried explaining to Pt safety concerns with episode of last night coming back from the bathroom and becoming weak and  slumped to her knees. Pt states I did not fall and I will get up when I want to. All safety measures continue for Pt.

## 2013-07-15 NOTE — Progress Notes (Signed)
ANTICOAGULATION CONSULT NOTE - Follow Up Consult  Pharmacy Consult for Warfarin Indication: atrial fibrillation  No Known Allergies  Patient Measurements: Height: 5\' 4"  (162.6 cm) Weight: 145 lb 8.1 oz (66 kg) IBW/kg (Calculated) : 54.7  Vital Signs: Temp: 98.6 F (37 C) (11/03 0655) Temp src: Oral (11/03 0655) BP: 117/63 mmHg (11/03 0655) Pulse Rate: 71 (11/03 0655)  Labs:  Recent Labs  07/13/13 2350 07/14/13 0305 07/14/13 1640 07/15/13 0449  HGB 13.6 12.5  --   --   HCT 39.3 35.9*  --   --   PLT 238 222  --   --   LABPROT  --  29.8* 27.8* 27.2*  INR  --  2.96* 2.71* 2.63*  CREATININE 0.54 0.55  --   --   TROPONINI <0.30  --   --   --     Estimated Creatinine Clearance: 43.7 ml/min (by C-G formula based on Cr of 0.55).   Medications:  Scheduled:  . amLODipine  2.5 mg Oral Daily  . hydrochlorothiazide  25 mg Oral Daily  . irbesartan  300 mg Oral Daily  . metoprolol succinate  50 mg Oral Daily  . pneumococcal 23 valent vaccine  0.5 mL Intramuscular Tomorrow-1000  . sodium chloride  3 mL Intravenous Q12H  . vitamin E  400 Units Oral Daily  . Warfarin - Pharmacist Dosing Inpatient   Does not apply q1800   Infusions:    Assessment: 89 yoF on warfarin PTA for hx Afib presents with left non-displaced pelvic fracture 2/2 syncope/fall. Pharmacy consulted to resume warfarin inpatient.  INR therapeutic today, 2.63  CBC ok on 11/2, no bleeding reported per chart notes   Concurrent vitamin E noted - this can increase bleeding risk with warfarin  Goal of Therapy:  INR 2-3   Plan:   Repeat warfarin 2.5mg  today per home dose  Daily PT/INR  Loralee Pacas, PharmD, BCPS Pager: [phone] 07/15/2013,9:43 AM

## 2013-07-15 NOTE — Progress Notes (Signed)
CSW provided patient with bed offers. Patient is very upset that [facility] does not have a bed for her. She went throught the list of bed offers one at a time, vetoing each facility one by one. CSW explained that medically she is stable and only under observation status. She was very surprised to hear that MD cleared her for discharge as she was not aware and does not feel she is ready.  Madeline Booth C. Rockford Leinen MSW, LCSW [phone]

## 2013-07-15 NOTE — Progress Notes (Signed)
Patient was walking back to bed from bathroom with walker and tech, felt faint, and slumped to her knees. Nurse tech pushed patient onto the bed to avoid hitting the floor full force. Patient didn't sustain any injuries, and is alert and orientated. NP Lenny Pastel was contacted and made aware of fall. Patient doesn't have any family to contact. Her only contact is a friend and she didn't won't [place] to contact her. We will continue to monitor the patient for any changes in status. We will also use the bed pan tonight as a precaution.

## 2013-07-15 NOTE — Progress Notes (Signed)
Physical Therapy Treatment Patient Details Name: Madeline Booth MRN: [id number] DOB: 06/17/1920 Today's Date: 07/15/2013 Time: 1210-1233 PT Time Calculation (min): 23 min  PT Assessment / Plan / Recommendation  History of Present Illness 79 yo female admitted after syncope, collapse at home. Sustained L pelvic fx-nondisplaced. Hx of R femur ORIF.    PT Comments   Pt progressing well   Follow Up Recommendations  SNF     Does the patient have the potential to tolerate intense rehabilitation     Barriers to Discharge        Equipment Recommendations  None recommended by PT    Recommendations for Other Services    Frequency Min 3X/week   Progress towards PT Goals Progress towards PT goals: Progressing toward goals  Plan Current plan remains appropriate    Precautions / Restrictions Precautions Precautions: Fall Restrictions Weight Bearing Restrictions: No   Pertinent Vitals/Pain No c/opain    Mobility  Bed Mobility Bed Mobility: Supine to Sit;Sit to Supine Supine to Sit: 4: Min guard;HOB elevated;With rails Sit to Supine: 4: Min guard;With rail Details for Bed Mobility Assistance: min/guard for safety Transfers Transfers: Sit to Stand;Stand to Sit Sit to Stand: 4: Min assist;4: Min guard;From bed;From toilet Stand to Sit: 4: Min guard;3: Mod assist;To bed;To toilet Details for Transfer Assistance: VCS safety, technique, hand placement. Assist to rise, stabilize, control descent Ambulation/Gait Ambulation/Gait Assistance: 4: Min guard Ambulation Distance (Feet): 180 Feet (and 15' more) Assistive device: Rolling walker Ambulation/Gait Assistance Details: verbal cues for posture and RW distance from self Gait Pattern: Step-to pattern;Antalgic;Trunk flexed;Decreased weight shift to left;Decreased stance time - left    Exercises     PT Diagnosis:    PT Problem List:   PT Treatment Interventions:     PT Goals (current goals can now be found in the care plan  section) Acute Rehab PT Goals Patient Stated Goal: home. regain independence Time For Goal Achievement: 07/28/13 Potential to Achieve Goals: Good  Visit Information  Last PT Received On: 07/15/13 Assistance Needed: +1 History of Present Illness: 79 yo female admitted after syncope, collapse at home. Sustained L pelvic fx-nondisplaced. Hx of R femur ORIF.     Subjective Data  Subjective: I have a friend coming Patient Stated Goal: home. regain independence   Cognition  Cognition Arousal/Alertness: Awake/alert Behavior During Therapy: WFL for tasks assessed/performed Overall Cognitive Status: Within Functional Limits for tasks assessed    Balance     End of Session PT - End of Session Equipment Utilized During Treatment: Gait belt Activity Tolerance: Patient tolerated treatment well Patient left: in bed;with call bell/phone within reach;with family/visitor present Nurse Communication: Mobility status   GP     [facility] 07/15/2013, 1:53 PM

## 2013-07-15 NOTE — Progress Notes (Signed)
[facility] has [place]. Patient cleared for discharge. Packet copied and given to nurse. ptar called for transportation. Patient understands no guarantee of payment. Patient has no family that she would like for [place] to notify of her discharge.  Dai Apel C. Vedha Tercero MSW, LCSW [phone]

## 2013-07-16 ENCOUNTER — Non-Acute Institutional Stay (SKILLED_NURSING_FACILITY): Payer: Medicare PPO | Admitting: Internal Medicine

## 2013-07-16 DIAGNOSIS — R55 Syncope and collapse: Secondary | ICD-10-CM

## 2013-07-16 DIAGNOSIS — E871 Hypo-osmolality and hyponatremia: Secondary | ICD-10-CM

## 2013-07-16 DIAGNOSIS — I4891 Unspecified atrial fibrillation: Secondary | ICD-10-CM

## 2013-07-16 DIAGNOSIS — I951 Orthostatic hypotension: Secondary | ICD-10-CM

## 2013-07-16 DIAGNOSIS — M810 Age-related osteoporosis without current pathological fracture: Secondary | ICD-10-CM

## 2013-07-16 NOTE — Progress Notes (Signed)
Patient ID: Madeline Booth, female   DOB: 1920-03-19, 79 y.o.   MRN: [id number]  Facility; [facility] Chief complaint; admission to SNF post admit to [facility] from November 1 to November 3  History; this is a 79 year old independent woman who lives in her own home. She tells me she was bending over to put some sheets she had cleaned a [employer] and then underwent a syncopal spell found herself on the floor with a bleeding nose and ecchymosis around her eye. She was admitted to hospital because of this. In discussing her history with her she has had 2 previous episodes of a "collapse" one roughly 8 years ago while she was traveling in an airport resulted in a workup in [place] at which time she was discovered to have atrial fibrillation. She has had Coumadin treatment since then although apparently the atrial fibrillation has not been rediscovered. A second episode occurred more recently while she was at the [employer]. She also had a fractured hip 2 years ago on the right and spent some time rehabilitating at [facility]  During this hospitalization her orthostatics were checked which did not support orthostasis. On telemetry the patient did not have any bradycardia or pauses. She apparently was found to have a pelvic fracture of the left superior pubic ramus without displacement. She was noted to have healing of her right intertrochanteric femur fracture. CT scan of the head C-spine and her most recent echocardiogram are shown below. Also noted that she had a sodium on November 1 of 129 and this was also 129 on November 2. Her white count was noted to be slightly elevated 13.6 on November 1, hemoglobin 13.6 as well troponin was less than 0.3 BNP was 734.       Study Result    CLINICAL DATA:  Syncopal episode with fall.   EXAM: CT HEAD WITHOUT CONTRAST   CT MAXILLOFACIAL WITHOUT CONTRAST   CT CERVICAL SPINE WITHOUT CONTRAST   TECHNIQUE: Multidetector CT imaging of the head,  cervical spine, and maxillofacial structures were performed using the standard protocol without intravenous contrast. Multiplanar CT image reconstructions of the cervical spine and maxillofacial structures were also generated.   COMPARISON:  None.   FINDINGS: CT HEAD FINDINGS   Skull and Sinuses:No calvarial fracture. Sinuses below.   Orbits: No acute abnormality.   Brain: No evidence of acute abnormality, such as acute infarction, hemorrhage, hydrocephalus, or mass lesion/mass effect. Brain atrophy. Chronic small vessel ischemic injury with confluent low-density around the lateral ventricles.   CT MAXILLOFACIAL FINDINGS   High-density material in the sphenoid sinus, likely hemosinus. No visual fracture. There are bony excrescences from the hard palate and alveolar ridge of the left maxilla. These could torus changes or osteochondromas. Torus mandibularis. No aggressive features. No evidence of acute global or orbital injury.   CT CERVICAL SPINE FINDINGS   No evidence of acute fracture. C3-4 anterolisthesis could be explained by advanced facet osteoarthritis. C5-6 and C6-7 degenerative disc narrowing is severe. There is multilevel facet spurring and joint narrowing. No prevertebral edema or gross cervical canal hematoma.   IMPRESSION: 1. No evidence of acute intracranial injury. 2. Small volume hemosinus without visible facial fracture. 3. No cervical spine fracture.        Room:       1411    PERFORMING   Taylor, [facility]     Lyda Perone  SONOGRAPHER  Arvil Chaco cc:  ------------------------------------------------------------ LV EF: 60%  ------------------------------------------------------------ Indications:  Syncope 780.2.  ------------------------------------------------------------ History:   PMH:  Coagulopathy  Atrial fibrillation.  Risk factors:   Hypertension.  ------------------------------------------------------------ Study Conclusions  - Left ventricle: The cavity size was normal. Wall thickness   was increased in a pattern of mild LVH. The estimated   ejection fraction was 60%. Wall motion was normal; there   were no regional wall motion abnormalities. - Aortic valve: Calcification of leaflets with mild AS. Peak   velocity: 268cm/s (S). Mean gradient: 14mm Hg (S). Peak   gradient: 29mm Hg (S). - Aorta: Calcification pf aortic root. Aortic root   dimension: 24mm (ED). - Mitral valve: Mild regurgitation. - Pulmonary arteries: PA peak pressure: 40mm Hg (S). Transthoracic echocardiography.  M-mode, complete 2D, spectral Doppler, and color Doppler.  Height:  Height: 162.6cm. Height: 64in.  Weight:  Weight: 65.3kg. Weight: 143.7lb.  Body mass index:  BMI: 24.7kg/m^2.  Body surface area:    BSA: 1.67m^2.  Blood pressure:     131/60.  Patient status:  Inpatient.  Location:  Bedside.  ------------------------------------------------------------  ------------------------------------------------------------ Left ventricle:  The cavity size was normal. Wall thickness was increased in a pattern of mild LVH. The estimated ejection fraction was 60%. Wall motion was normal; there were no regional wall motion abnormalities.  ------------------------------------------------------------ Aortic valve:  Calcification of leaflets with mild AS. Doppler:     Valve area: 1.09cm^2(VTI). Indexed valve area: 0.64cm^2/m^2 (VTI). Peak velocity ratio of LVOT to aortic valve: 0.4. Valve area: 1.12cm^2 (Vmax). Indexed valve area: 0.66cm^2/m^2 (Vmax).    Mean gradient: 14mm Hg (S). Peak gradient: 29mm Hg (S).  ------------------------------------------------------------ Aorta:  Calcification pf aortic root.  ------------------------------------------------------------ Mitral valve:   Mildly thickened leaflets .  Doppler:   Mild regurgitation.     Peak gradient: 3mm Hg (D).  ------------------------------------------------------------ Left atrium:  The atrium was at the upper limits of normal in size.  ------------------------------------------------------------ Right ventricle:  The cavity size was normal. Systolic function was normal.  ------------------------------------------------------------ Pulmonic valve:    The valve appears to be grossly normal.  Doppler:   No significant regurgitation.  ------------------------------------------------------------ Tricuspid valve:   Structurally normal valve.   Leaflet separation was normal.  Doppler:  Transvalvular velocity was within the normal range.  Mild regurgitation.  ------------------------------------------------------------ Right atrium:  The atrium was at the upper limits of normal in size.  ------------------------------------------------------------ Pericardium:  There was no pericardial effusion.   ------------------------------------------------------------ Post procedure conclusions Ascending Aorta:  - Calcification pf aortic root.  ------------------------------------------------------------  2D measurements        Normal  Doppler measurements   Normal Left ventricle                 Main pulmonary LVID ED,     38 mm     43-52   artery chord,                         Pressure,    40 mm Hg  =30 PLAX                           S LVID ES,   26.4 mm     23-38   Left ventricle chord,                         Ea, lat    8.05 cm/s   ------ PLAX  ann, tiss FS, chord,   31 %      >29     DP PLAX                           E/Ea, lat  10.6        ------ LVPW, ED   11.1 mm     ------  ann, tiss     1 IVS/LVPW   1.05        <1.3    DP ratio, ED                      Ea, med    6.09 cm/s   ------ Ventricular septum             ann, tiss IVS, ED    11.6 mm     ------  DP LVOT                           E/Ea, med  14.0        ------ Diam, S       19 mm     ------  ann, tiss     2 Area       2.84 cm^2   ------  DP Aorta                          LVOT Root diam,   24 mm     ------  Peak vel,   106 cm/s   ------ ED                             S Left atrium                    Aortic valve AP dim       38 mm     ------  Peak vel,   268 cm/s   ------ AP dim     2.24 cm/m^2 <2.2    S index                          Mean vel,   175 cm/s   ------                                S                                VTI, S     59.4 cm     ------                                Mean         14 mm Hg  ------                                gradient,                                S  Peak         29 mm Hg  ------                                gradient,                                S                                Area, VTI  1.09 cm^2   ------                                Area index 0.64 cm^2/m ------                                (VTI)           ^2                                Peak vel    0.4        ------                                ratio,                                LVOT/AV                                Area, Vmax 1.12 cm^2   ------                                Area index 0.66 cm^2/m ------                                (Vmax)          ^2                                Mitral valve                                Peak E vel 85.4 cm/s   ------                                Peak A vel 94.1 cm/s   ------                                Decelerati  169 ms     150-23  on time                0                                Peak          3 mm Hg  ------                                gradient,                                D                                Peak E/A    0.9        ------                                ratio                                Tricuspid valve                                Regurg      289 cm/s   ------                                peak vel                                 Peak RV-RA   33 mm Hg  ------                                gradient,                                S                                Max regurg  289 cm/s   ------                                vel                                Systemic veins                                Estimated     7 mm Hg  ------                                CVP  Right ventricle                                Pressure,    40 mm Hg  <30                                S                                Sa vel,    16.6 cm/s   ------                                lat ann,                                tiss DP   ------------------------------------------------------------  Past Medical History  Diagnosis Date  . Hypertension   . Intertrochanteric fracture of right femur . Breast CA s/p lumpectomy 2006 09/21/2011   Past Surgical History  Procedure Laterality Date  . Femur im nail  09/21/2011    Procedure: INTRAMEDULLARY (IM) NAIL FEMORAL;  Surgeon: Eulas Post;  Location: [facility];  Service: Orthopedics;  Laterality: Right;   Medications; Fosamax 70 mg every 7 days Norvasc 2.5 mg daily, Caltrate 600 plus D1 by mouth twice a day, Toprol-XL 50 mg daily, Benicar/hydrochlorothiazide 40/25 one half tablet daily.Coumadin 2.5 daily  Social history; this is a fiercely independent 87 -year-old woman still driving independent with ADLs and IADLs. Over last few years has recently given up her gardening. Lives in her own home in [place] does not use an ambulatory assist device although she has a cane or walker dating back to her right femur fracture. Denies any recent problems with falling although she tells me that she was lifting a bag of 1 fertilizer several days before her hospitalization hadn't feels that the pelvic fracture may have happened actually before her syncopal event she complained of "left groin pain "  reports that she has never smoked. She does not  have any smokeless tobacco history on file. She reports that she does not drink alcohol. She does not have any nearby family  Review of systems Respiratory; no cough or sputum Cardiac; no chest pain no palpitations. Feels that this is the only true syncopal spell that she had although she does report to previous "collapses" which are listed above in the history of present illness. She does not describe orthostatic symptoms or intolerances GI some degree of mild constipation some degree of urinary frequency Neurologic; has some degree of right leg weakness which she feels is related to her previous right hip fracture  Physical examination; Gen.; very healthy looking 79 year old woman. Resolving ecchymosis around her left eye. Vital signs; blood pressure 135/65 pulse 77 supine standing she drops to 110 systolic pulse 82 at 2 minutes. She does not have clear symptoms. Respiratory clear entry bilaterally Cardiac; 2/6 systolic ejection murmur that radiates into the left axilla perhaps mildly to her right carotid. There's no S4 Abdomen no liver no spleen no tenderness no masses GU bladder is not distended there is no tenderness at the Musculoskeletal; she has bilateral hip flexor weakness mostly due to the pain on the left. Her reflexes at  the knee jerks are maintained Neurologic; with some minor difficulty she is able to bring herself to a standing position. I did not attempt to ambulate her Mental status no overt difficulties are noted  Impression/plan #1 syncopal fall. It is difficult to tell talking to the patient whether this is the first similar event or not. The cause of this was not clearly defined in the hospital. However she does have orthostatic blood pressure drops although she is asymptomatic. She also has mild hyponatremia and mild aortic stenosis. She has a history of atrial fibrillation although according to the patient this has not been reproducible since her original event while  traveling through the [place]. Suppose it is not impossible that she had a repeat episode of this arrhythmia possibly with bradycardia However this was not documented in the hospital #2 hyponatremia; any degree of hyponatremia in a 79 year old should be taken seriously. In going to propose to her to stop her diuretic I will see if she will agree to this. #3 orthostatic hypotension; asymptomatic as described above. Nevertheless this could be contributing to her syncope in some way. It is stated that orthostasis did not have anything to do with her presentation in the hospital and while I admire the completeness of their evaluation this statement is not correct as noted above. #4 osteoporosis; she is on both Fosamax calcium and vitamin D. All leave any further adjustments to her primary physician at this point #5 lower extremity weakness; she will need aggressive physical therapy before we make an attempt for her to return home.  #6 mild leukocytosis although I see no evidence of anything that should be causing this based on bedside exam I will simply repeat it before giving [place] any further thought. #7 on chronic Coumadin therapy for this remote history of atrial fibrillation. I will follow her PT and INR in the facility  I discussed the issues with the patient the. I'm going to change her from the combination of Benicar and hydrochlorothiazide appear Benicar. I will repeat her sodium early next week. The patient is already asking me about discharge, I told her I think this is premature. She will need to be ambulating safely as she needs to be independent  to return to her own environment

## 2013-12-26 ENCOUNTER — Encounter (HOSPITAL_BASED_OUTPATIENT_CLINIC_OR_DEPARTMENT_OTHER): Payer: Self-pay | Admitting: *Deleted

## 2013-12-26 ENCOUNTER — Other Ambulatory Visit: Payer: Self-pay | Admitting: Orthopedic Surgery

## 2013-12-26 NOTE — Progress Notes (Signed)
Very independent lady-she has had mastectomy rt and a lumpectomy here 2006-drove home the next morning. I did tell her she would have to have someone stay with her post op-she is on coumadin for af-to stop 4/17-will need inr-pt and istat-coming in 2 hr preop

## 2013-12-27 ENCOUNTER — Encounter (HOSPITAL_BASED_OUTPATIENT_CLINIC_OR_DEPARTMENT_OTHER): Payer: Self-pay | Admitting: *Deleted

## 2013-12-27 NOTE — H&P (Signed)
Madeline Booth is an 80 y.o. female.   CC / Reason for Visit: Left long finger problem HPI: This patient is a 80 year old female who presents for evaluation of her left long finger.  She reports falling at home while doing yard work on Easter.  She had the onset of this problem at that time.  She was evaluated for the first time yesterday by Dr. Mardelle Matte, and attempted closed reduction of her dislocation were unsuccessful.  She takes Coumadin and hand already took her daily dose today.  Past Medical History  Diagnosis Date  . Hypertension   . Intertrochanteric fracture of right femur 09/21/2011  . Wears glasses   . Cancer     breast cancer  . Dysrhythmia     PAF noted on 07/13/2013 note    Past Surgical History  Procedure Laterality Date  . Femur im nail  09/21/2011    Procedure: INTRAMEDULLARY (IM) NAIL FEMORAL;  Surgeon: Johnny Bridge;  Location: [facility];  Service: Orthopedics;  Laterality: Right;  . Appendectomy    . Tonsillectomy    . Dilation and curettage of uterus    . Sigmoidoscopy    . Mastectomy  1981    right  . Breast lumpectomy with axillary lymph node biopsy  2006    left-Dr Young-[facility]    History reviewed. No pertinent family history. Social History:  reports that she has never smoked. She does not have any smokeless tobacco history on file. She reports that she does not drink alcohol. Her drug history is not on file.  Allergies: No Known Allergies  No prescriptions prior to admission    No results found for this or any previous visit (from the past 48 hour(s)). No results found.  Review of Systems  All other systems reviewed and are negative.   Height 5\' 4"  (1.626 m), weight 65.772 kg (145 lb). Physical Exam  Constitutional:  WD, WN, NAD HEENT:  NCAT, EOMI Neuro/Psych:  Alert & oriented to person, place, and time; appropriate mood & affect Lymphatic: No generalized UE edema or lymphadenopathy Extremities / MSK:  Both UE are normal with respect to appearance,  ranges of motion, joint stability, muscle strength/tone, sensation, & perfusion except as otherwise noted:  The left long finger is ulnarly deviated at the MCP and shorter than it might normally be.  The flexor and extensor tendons appear to be intact to manual testing.  That digit in the hand remains a little puffy and swollen.  Labs / Xrays:  No radiographic studies obtained today.  X-rays from yesterday reviewed and reveal a palmar-ulnar dislocation at the MCP joint.  There are some bone fragments consistent with a collateral ligament avulsion from the radial side of the metacarpal head  Assessment:  Left long finger MCP complex dislocation  Plan: Both with and without the installation of some local anesthesia, various establish reduction maneuvers were attempted, with no success in achieving concentric reduction.  I discussed these findings with her and recommended open reduction, likely with concomitant soft tissue repair.  I checked with her primary physician's office, who indicated that she can simply come off of her Coumadin for surgery and not require Lovenox bridging.  Based upon the duration of her dislocation at this point, we will plan to postpone surgery until Monday and allow for her INR to approach normal.    The details of the operative procedure were discussed with the patient.  Questions were invited and answered.  In addition to the goal  of the procedure, the risks of the procedure to include but not limited to bleeding; infection; damage to the nerves or blood vessels that could result in bleeding, numbness, weakness, chronic pain, and the need for additional procedures; stiffness; the need for revision surgery; and anesthetic risks were reviewed.  No specific outcome was guaranteed or implied.  Informed consent was obtained.   [name] 12/27/2013, 2:44 PM

## 2013-12-30 ENCOUNTER — Ambulatory Visit (HOSPITAL_BASED_OUTPATIENT_CLINIC_OR_DEPARTMENT_OTHER)
Admission: RE | Admit: 2013-12-30 | Discharge: 2013-12-30 | Disposition: A | Discharge status: Home / Self Care | Payer: Medicare PPO | Source: Ambulatory Visit | Attending: Orthopedic Surgery | Admitting: Orthopedic Surgery

## 2013-12-30 ENCOUNTER — Encounter (HOSPITAL_BASED_OUTPATIENT_CLINIC_OR_DEPARTMENT_OTHER): Payer: Self-pay

## 2013-12-30 ENCOUNTER — Ambulatory Visit (HOSPITAL_BASED_OUTPATIENT_CLINIC_OR_DEPARTMENT_OTHER): Payer: Medicare PPO | Admitting: Anesthesiology

## 2013-12-30 ENCOUNTER — Encounter (HOSPITAL_BASED_OUTPATIENT_CLINIC_OR_DEPARTMENT_OTHER): Payer: Medicare PPO | Admitting: Anesthesiology

## 2013-12-30 ENCOUNTER — Encounter (HOSPITAL_BASED_OUTPATIENT_CLINIC_OR_DEPARTMENT_OTHER)
Admission: RE | Disposition: A | Discharge status: Home / Self Care | Payer: Self-pay | Source: Ambulatory Visit | Attending: Orthopedic Surgery

## 2013-12-30 ENCOUNTER — Ambulatory Visit (HOSPITAL_COMMUNITY): Payer: Medicare PPO

## 2013-12-30 DIAGNOSIS — Z853 Personal history of malignant neoplasm of breast: Secondary | ICD-10-CM | POA: Insufficient documentation

## 2013-12-30 DIAGNOSIS — I4891 Unspecified atrial fibrillation: Secondary | ICD-10-CM | POA: Insufficient documentation

## 2013-12-30 DIAGNOSIS — Z7901 Long term (current) use of anticoagulants: Secondary | ICD-10-CM | POA: Insufficient documentation

## 2013-12-30 DIAGNOSIS — Z9010 Acquired absence of unspecified breast and nipple: Secondary | ICD-10-CM | POA: Insufficient documentation

## 2013-12-30 DIAGNOSIS — I10 Essential (primary) hypertension: Secondary | ICD-10-CM | POA: Insufficient documentation

## 2013-12-30 DIAGNOSIS — M24443 Recurrent dislocation, unspecified hand: Secondary | ICD-10-CM | POA: Insufficient documentation

## 2013-12-30 HISTORY — DX: Presence of spectacles and contact lenses: Z97.3

## 2013-12-30 HISTORY — PX: OPEN REDUCTION INTERNAL FIXATION (ORIF) METACARPAL: SHX6234

## 2013-12-30 LAB — POCT I-STAT, CHEM 8
BUN: 14 mg/dL (ref 6–23)
CREATININE: 0.8 mg/dL (ref 0.50–1.10)
Calcium, Ion: 1.1 mmol/L — ABNORMAL LOW (ref 1.13–1.30)
Chloride: 98 mEq/L (ref 96–112)
Glucose, Bld: 98 mg/dL (ref 70–99)
HEMATOCRIT: 43 % (ref 36.0–46.0)
HEMOGLOBIN: 14.6 g/dL (ref 12.0–15.0)
POTASSIUM: 3.8 meq/L (ref 3.7–5.3)
SODIUM: 138 meq/L (ref 137–147)
TCO2: 29 mmol/L (ref 0–100)

## 2013-12-30 LAB — PROTIME-INR
INR: 1.23 (ref 0.00–1.49)
PROTHROMBIN TIME: 15.2 s (ref 11.6–15.2)

## 2013-12-30 SURGERY — OPEN REDUCTION INTERNAL FIXATION (ORIF) METACARPAL
Anesthesia: General | Site: Hand | Laterality: Left

## 2013-12-30 MED ORDER — OXYCODONE HCL 5 MG/5ML PO SOLN: 5.0000 mg | Freq: Once | ORAL | Status: DC | PRN

## 2013-12-30 MED ORDER — BUPIVACAINE-EPINEPHRINE PF 0.5-1:200000 % IJ SOLN
INTRAMUSCULAR | Status: AC
  Filled 2013-12-30: qty 30

## 2013-12-30 MED ORDER — BUPIVACAINE-EPINEPHRINE PF 0.5-1:200000 % IJ SOLN
INTRAMUSCULAR | Status: DC | PRN
  Administered 2013-12-30: 10 mL via PERINEURAL

## 2013-12-30 MED ORDER — LACTATED RINGERS IV SOLN
INTRAVENOUS | Status: DC
  Administered 2013-12-30 (×2): via INTRAVENOUS

## 2013-12-30 MED ORDER — MIDAZOLAM HCL 2 MG/2ML IJ SOLN: 1.0000 mg | INTRAMUSCULAR | Status: DC | PRN

## 2013-12-30 MED ORDER — FENTANYL CITRATE 0.05 MG/ML IJ SOLN: 50.0000 ug | Freq: Once | INTRAMUSCULAR | Status: DC

## 2013-12-30 MED ORDER — CEFAZOLIN SODIUM-DEXTROSE 2-3 GM-% IV SOLR
INTRAVENOUS | Status: AC
  Filled 2013-12-30: qty 50

## 2013-12-30 MED ORDER — BUPIVACAINE HCL (PF) 0.25 % IJ SOLN
INTRAMUSCULAR | Status: AC
  Filled 2013-12-30: qty 30

## 2013-12-30 MED ORDER — FENTANYL CITRATE 0.05 MG/ML IJ SOLN: 50.0000 ug | INTRAMUSCULAR | Status: DC | PRN

## 2013-12-30 MED ORDER — PROPOFOL 10 MG/ML IV BOLUS
INTRAVENOUS | Status: DC | PRN
  Administered 2013-12-30: 140 mg via INTRAVENOUS

## 2013-12-30 MED ORDER — LIDOCAINE HCL (CARDIAC) 20 MG/ML IV SOLN
INTRAVENOUS | Status: DC | PRN
  Administered 2013-12-30: 40 mg via INTRAVENOUS

## 2013-12-30 MED ORDER — LACTATED RINGERS IV SOLN
INTRAVENOUS | Status: DC
  Administered 2013-12-30: 09:00:00 via INTRAVENOUS

## 2013-12-30 MED ORDER — DEXAMETHASONE SODIUM PHOSPHATE 4 MG/ML IJ SOLN
INTRAMUSCULAR | Status: DC | PRN
  Administered 2013-12-30: 4 mg via INTRAVENOUS

## 2013-12-30 MED ORDER — PROPOFOL 10 MG/ML IV BOLUS
INTRAVENOUS | Status: AC
Start: 2013-12-30 — End: 2013-12-30
  Filled 2013-12-30: qty 20

## 2013-12-30 MED ORDER — CEFAZOLIN SODIUM-DEXTROSE 2-3 GM-% IV SOLR
2.0000 g | INTRAVENOUS | Status: AC
  Administered 2013-12-30: 2 g via INTRAVENOUS

## 2013-12-30 MED ORDER — HYDROCODONE-ACETAMINOPHEN 5-325 MG PO TABS: 1.0000 | ORAL_TABLET | Freq: Four times a day (QID) | ORAL | Status: DC | PRN

## 2013-12-30 MED ORDER — FENTANYL CITRATE 0.05 MG/ML IJ SOLN: 25.0000 ug | INTRAMUSCULAR | Status: DC | PRN

## 2013-12-30 MED ORDER — FENTANYL CITRATE 0.05 MG/ML IJ SOLN
INTRAMUSCULAR | Status: AC
  Filled 2013-12-30: qty 4

## 2013-12-30 MED ORDER — ONDANSETRON HCL 4 MG/2ML IJ SOLN
INTRAMUSCULAR | Status: DC | PRN
  Administered 2013-12-30: 4 mg via INTRAVENOUS

## 2013-12-30 MED ORDER — LIDOCAINE HCL (PF) 1 % IJ SOLN
INTRAMUSCULAR | Status: AC
  Filled 2013-12-30: qty 30

## 2013-12-30 MED ORDER — OXYCODONE HCL 5 MG PO TABS: 5.0000 mg | ORAL_TABLET | Freq: Once | ORAL | Status: DC | PRN

## 2013-12-30 MED ORDER — FENTANYL CITRATE 0.05 MG/ML IJ SOLN
INTRAMUSCULAR | Status: DC | PRN
  Administered 2013-12-30 (×2): 25 ug via INTRAVENOUS
  Administered 2013-12-30: 50 ug via INTRAVENOUS

## 2013-12-30 SURGICAL SUPPLY — 48 items
ANCH SUT 2-0 MN NDL DRL PLSTR (Anchor) ×1 IMPLANT
ANCHOR JUGGERKNOT 1.0 1DR 2-0 (Anchor) ×2 IMPLANT
BANDAGE COBAN STERILE 2 (GAUZE/BANDAGES/DRESSINGS) IMPLANT
BLADE MINI RND TIP GREEN BEAV (BLADE) IMPLANT
BLADE SURG 15 STRL LF DISP TIS (BLADE) ×1 IMPLANT
BLADE SURG 15 STRL SS (BLADE) ×3
BNDG CMPR 9X4 STRL LF SNTH (GAUZE/BANDAGES/DRESSINGS) ×1
BNDG COHESIVE 4X5 TAN STRL (GAUZE/BANDAGES/DRESSINGS) ×3 IMPLANT
BNDG ESMARK 4X9 LF (GAUZE/BANDAGES/DRESSINGS) ×2 IMPLANT
BNDG GAUZE ELAST 4 BULKY (GAUZE/BANDAGES/DRESSINGS) ×6 IMPLANT
BNDG PLASTER X FAST 3X3 WHT LF (CAST SUPPLIES) ×16 IMPLANT
BNDG PLSTR 9X3 FST ST WHT (CAST SUPPLIES) ×8
CHLORAPREP W/TINT 26ML (MISCELLANEOUS) ×3 IMPLANT
CORDS BIPOLAR (ELECTRODE) ×3 IMPLANT
COVER MAYO STAND STRL (DRAPES) ×3 IMPLANT
COVER TABLE BACK 60X90 (DRAPES) ×3 IMPLANT
CUFF TOURNIQUET SINGLE 18IN (TOURNIQUET CUFF) ×2 IMPLANT
DRAPE C-ARM 42X72 X-RAY (DRAPES) ×3 IMPLANT
DRAPE EXTREMITY T 121X128X90 (DRAPE) ×3 IMPLANT
DRAPE SURG 17X23 STRL (DRAPES) ×3 IMPLANT
DRSG EMULSION OIL 3X3 NADH (GAUZE/BANDAGES/DRESSINGS) ×3 IMPLANT
GLOVE BIO SURGEON STRL SZ7.5 (GLOVE) ×3 IMPLANT
GLOVE BIOGEL PI IND STRL 7.0 (GLOVE) IMPLANT
GLOVE BIOGEL PI IND STRL 8 (GLOVE) ×1 IMPLANT
GLOVE BIOGEL PI INDICATOR 7.0 (GLOVE) ×2
GLOVE BIOGEL PI INDICATOR 8 (GLOVE) ×2
GLOVE ECLIPSE 6.5 STRL STRAW (GLOVE) ×2 IMPLANT
GOWN STRL REUS W/ TWL LRG LVL3 (GOWN DISPOSABLE) ×2 IMPLANT
GOWN STRL REUS W/TWL LRG LVL3 (GOWN DISPOSABLE) ×6
K-WIRE .045X4 (WIRE) ×4 IMPLANT
NEEDLE HYPO 22GX1.5 SAFETY (NEEDLE) IMPLANT
NS IRRIG 1000ML POUR BTL (IV SOLUTION) ×3 IMPLANT
PACK BASIN DAY SURGERY FS (CUSTOM PROCEDURE TRAY) ×3 IMPLANT
PAD CAST 4YDX4 CTTN HI CHSV (CAST SUPPLIES) ×1 IMPLANT
PADDING CAST ABS 4INX4YD NS (CAST SUPPLIES) ×2
PADDING CAST ABS COTTON 4X4 ST (CAST SUPPLIES) IMPLANT
PADDING CAST COTTON 4X4 STRL (CAST SUPPLIES)
RUBBERBAND STERILE (MISCELLANEOUS) ×1 IMPLANT
SPONGE GAUZE 4X4 12PLY (GAUZE/BANDAGES/DRESSINGS) ×3 IMPLANT
STOCKINETTE 4X48 STRL (DRAPES) ×3 IMPLANT
SUT VICRYL 3-0 RB1 (SUTURE) ×2 IMPLANT
SUT VICRYL RAPIDE 4-0 (SUTURE) IMPLANT
SUT VICRYL RAPIDE 4/0 PS 2 (SUTURE) ×2 IMPLANT
SYR BULB 3OZ (MISCELLANEOUS) IMPLANT
SYRINGE 10CC LL (SYRINGE) IMPLANT
TOWEL OR 17X24 6PK STRL BLUE (TOWEL DISPOSABLE) ×3 IMPLANT
TOWEL OR NON WOVEN STRL DISP B (DISPOSABLE) ×1 IMPLANT
UNDERPAD 30X30 INCONTINENT (UNDERPADS AND DIAPERS) ×3 IMPLANT

## 2013-12-30 NOTE — Anesthesia Procedure Notes (Signed)
Procedure Name: LMA Insertion Date/Time: 12/30/2013 11:05 AM Performed by: Lyndee Leo Pre-anesthesia Checklist: Patient identified, Emergency Drugs available, Suction available and Patient being monitored Patient Re-evaluated:Patient Re-evaluated prior to inductionOxygen Delivery Method: Circle System Utilized Preoxygenation: Pre-oxygenation with 100% oxygen Intubation Type: IV induction Ventilation: Mask ventilation without difficulty LMA: LMA inserted LMA Size: 4.0 Number of attempts: 1 Airway Equipment and Method: bite block Placement Confirmation: positive ETCO2 Tube secured with: Tape Dental Injury: Teeth and Oropharynx as per pre-operative assessment

## 2013-12-30 NOTE — Transfer of Care (Signed)
Immediate Anesthesia Transfer of Care Note  Patient: Madeline Booth  Procedure(s) Performed: Procedure(s) with comments: OPEN REDUCTION INTERNAL FIXATION (ORIF) METACARPAL (Left) - Open reduction left long finger metacarpophalangeal dislocation  Patient Location: PACU  Anesthesia Type:General  Level of Consciousness: awake, sedated and patient cooperative  Airway & Oxygen Therapy: Patient Spontanous Breathing and Patient connected to face mask oxygen  Post-op Assessment: Report given to PACU RN and Post -op Vital signs reviewed and stable  Post vital signs: Reviewed and stable  Complications: No apparent anesthesia complications

## 2013-12-30 NOTE — Op Note (Signed)
12/30/2013  10:51 AM  PATIENT:  Madeline Booth  80 y.o. female  PRE-OPERATIVE DIAGNOSIS:  Left LF chronic complex MPJ dislocation  POST-OPERATIVE DIAGNOSIS:  Same  PROCEDURE:  Left LF MPJ open reduction , RCL repair and stabilization with 0.045 inch K wire  SURGEON: Rayvon Char. Grandville Silos, MD  PHYSICIAN ASSISTANT: None  ANESTHESIA:  general  SPECIMENS:  None  DRAINS:   None  PREOPERATIVE INDICATIONS:  Madeline Booth is a  80 y.o. female with a left LF MPJ complex chronic dislocation that occurred on December 15, 1013.  The risks benefits and alternatives were discussed with the patient preoperatively including but not limited to the risks of infection, bleeding, nerve injury, cardiopulmonary complications, the need for revision surgery, among others, and the patient verbalized understanding and consented to proceed.  OPERATIVE IMPLANTS: Mini Juggerknot and 0.045 inch K wire  OPERATIVE PROCEDURE:  After receiving prophylactic antibiotics, the patient was escorted to the operative theatre and placed in a supine position.  General anesthesia was administered.  A surgical "time-out" was performed during which the planned procedure, proposed operative site, and the correct patient identity were compared to the operative consent and agreement confirmed by the circulating nurse according to current facility policy.  Following application of a tourniquet to the operative extremity, the exposed skin was prepped with Chloraprep and draped in the usual sterile fashion.  The limb was exsanguinated with an Esmarch bandage and the tourniquet inflated to approximately 173mmHg higher than systolic BP.  A curvilinear incision with its apex radial was marked and made sharply over the MP joint. Full-thickness flaps were elevated. The extensor tendon was split longitudinally down the middle and reflected radially and ulnarly. The dorsal capsule was incised, yielding a view of the joint. The cartilage at the base  of the proximal phalanx had grade 2 chondromalacia evident.  There was a large fragment of bone from the dorsal radial aspect of the metacarpal head that had been avulsed with radial collateral ligament, and this was then folded into the joint. It was manually reduced. The ulnar collateral ligament was incised off of the ulnar side of the metacarpal head to help with restoring balance. A mini Juggerknot suture anchor was placed into the head and used to secure the radial collateral ligament and bone fragments into place on the dorsal radial aspect of the head in a juxta articular position. The joint still had a slight tendency to rest ulnarly deviated although was easily passively correctable. The wound was irrigated and the capsule was closed with 3-0 Vicryl running suture. The extensor tendon split was reapproximated with the same suture. Tourniquet was released and additional hemostasis obtained and half percent Marcaine with epinephrine was instilled into the wound edges to help with postoperative pain control. A 0.045 inch K wire was driven through the metacarpal head into the proximal phalanx placing it about 45 of flexion and neutral to very slight radial deviation. The incision was further closed with 4-0 Vicryl Rapide interrupted and running horizontal mattress sutures and a bulky hand dressing was applied with a volar plaster component is supported the MP joints but let the IP joints move freely. She was awakened and taken to recovery in stable condition breathing spontaneously  DISPOSITION: She'll be discharged home today with typical postop instructions. We will contact her to arrange for followup with [place] and hand therapy to make a protective splint and start rehabilitation. She will need x-rays of the left long finger at her followup visit.  We will pull the pin later, at approximately 3 weeks postop.

## 2013-12-30 NOTE — Anesthesia Postprocedure Evaluation (Signed)
  Anesthesia Post-op Note  Patient: Madeline Booth  Procedure(s) Performed: Procedure(s) with comments: OPEN REDUCTION INTERNAL FIXATION (ORIF) METACARPAL DISLOCATION AND COLLATERAL LIGAMENT REPAIR (Left) - Open reduction left long finger metacarpophalangeal dislocation  Patient Location: PACU  Anesthesia Type:General  Level of Consciousness: awake  Airway and Oxygen Therapy: Patient Spontanous Breathing  Post-op Pain: mild  Post-op Assessment: Post-op Vital signs reviewed, Patient's Cardiovascular Status Stable, Respiratory Function Stable, Patent Airway, No signs of Nausea or vomiting and Pain level controlled  Post-op Vital Signs: Reviewed and stable  Last Vitals:  Filed Vitals:   12/30/13 1230  BP: 175/81  Pulse: 59  Temp: 36.3 C  Resp: 15    Complications: No apparent anesthesia complications

## 2013-12-30 NOTE — Discharge Instructions (Signed)
Discharge Instructions ° ° °You have a dressing with a plaster splint incorporated in it. °Move your fingers as much as possible, making a full fist and fully opening the fist. °Elevate your hand to reduce pain & swelling of the digits.  Ice over the operative site may be helpful to reduce pain & swelling.  DO NOT USE HEAT. °Pain medicine has been prescribed for you.  °Use your medicine as needed over the first 48 hours, and then you can begin to taper your use.  You may use Tylenol in place of your prescribed pain medication, but not IN ADDITION to it. °Leave the dressing in place until you return to our office.  °You may shower, but keep the bandage clean & dry.  °You may drive a car when you are off of prescription pain medications and can safely control your vehicle with both hands. °Our office will call you to arrange follow-up ° ° °Please call [phone] during normal business hours or [phone] after hours for any problems. Including the following: ° °- excessive redness of the incisions °- drainage for more than 4 days °- fever of more than 101.5 F ° °*Please note that pain medications will not be refilled after hours or on weekends. ° ° °Post Anesthesia Home Care Instructions ° °Activity: °Get plenty of rest for the remainder of the day. A responsible adult should stay with you for 24 hours following the procedure.  °For the next 24 hours, DO NOT: °-Drive a car °-Operate machinery °-Drink alcoholic beverages °-Take any medication unless instructed by your physician °-Make any legal decisions or sign important papers. ° °Meals: °Start with liquid foods such as gelatin or soup. Progress to regular foods as tolerated. Avoid greasy, spicy, heavy foods. If nausea and/or vomiting occur, drink only clear liquids until the nausea and/or vomiting subsides. Call your physician if vomiting continues. ° °Special Instructions/Symptoms: °Your throat may feel dry or sore from the anesthesia or the breathing tube  placed in your throat during surgery. If this causes discomfort, gargle with warm salt water. The discomfort should disappear within 24 hours. ° °

## 2013-12-30 NOTE — Interval H&P Note (Signed)
History and Physical Interval Note:  12/30/2013 10:51 AM  Madeline Booth  has presented today for surgery, with the diagnosis of Left long finger MP dislocation  The various methods of treatment have been discussed with the patient and family. After consideration of risks, benefits and other options for treatment, the patient has consented to  Procedure(s) with comments: OPEN REDUCTION INTERNAL FIXATION (ORIF) METACARPAL (Left) - Open reduction left long finger metacarpophalangeal dislocation as a surgical intervention .  The patient's history has been reviewed, patient examined, no change in status, stable for surgery.  I have reviewed the patient's chart and labs.  Questions were answered to the patient's satisfaction.     [name]

## 2013-12-30 NOTE — Anesthesia Preprocedure Evaluation (Signed)
Anesthesia Evaluation  Patient identified by MRN, date of birth, ID band Patient awake  General Assessment Comment:NPO today Advanced years  Reviewed: Allergy & Precautions, H&P , NPO status , Patient's Chart, lab work & pertinent test results, reviewed documented beta blocker date and time   Airway Mallampati: II TM Distance: >3 FB Neck ROM: Full    Dental  (+) Teeth Intact   Pulmonary neg pulmonary ROS,  breath sounds clear to auscultation        Cardiovascular hypertension, Pt. on medications + dysrhythmias Atrial Fibrillation Rhythm:Irregular Rate:Normal + Systolic murmurs Denies cardiac symptoms   Neuro/Psych negative neurological ROS  negative psych ROS   GI/Hepatic negative GI ROS, Neg liver ROS,   Endo/Other  negative endocrine ROS  Renal/GU negative Renal ROS  negative genitourinary   Musculoskeletal negative musculoskeletal ROS (+)   Abdominal   Peds negative pediatric ROS (+)  Hematology negative hematology ROS (+)   Anesthesia Other Findings Upper right bridge  Reproductive/Obstetrics negative OB ROS                           Anesthesia Physical Anesthesia Plan  ASA: III  Anesthesia Plan: General   Post-op Pain Management:    Induction: Intravenous  Airway Management Planned: LMA  Additional Equipment:   Intra-op Plan:   Post-operative Plan: Extubation in OR  Informed Consent: I have reviewed the patients History and Physical, chart, labs and discussed the procedure including the risks, benefits and alternatives for the proposed anesthesia with the patient or authorized representative who has indicated his/her understanding and acceptance.     Plan Discussed with: CRNA and Surgeon  Anesthesia Plan Comments:         Anesthesia Quick Evaluation

## 2013-12-31 ENCOUNTER — Encounter (HOSPITAL_BASED_OUTPATIENT_CLINIC_OR_DEPARTMENT_OTHER): Payer: Self-pay | Admitting: Orthopedic Surgery

## 2014-03-27 ENCOUNTER — Observation Stay (HOSPITAL_COMMUNITY)
Admission: EM | Admit: 2014-03-27 | Discharge: 2014-03-29 | Disposition: A | Discharge status: Skilled Nursing Facility | Payer: Medicare PPO | Attending: Internal Medicine | Admitting: Internal Medicine

## 2014-03-27 ENCOUNTER — Emergency Department (HOSPITAL_COMMUNITY): Payer: Medicare PPO

## 2014-03-27 ENCOUNTER — Encounter (HOSPITAL_COMMUNITY): Payer: Self-pay | Admitting: Emergency Medicine

## 2014-03-27 DIAGNOSIS — D72829 Elevated white blood cell count, unspecified: Secondary | ICD-10-CM | POA: Diagnosis not present

## 2014-03-27 DIAGNOSIS — R112 Nausea with vomiting, unspecified: Secondary | ICD-10-CM | POA: Diagnosis not present

## 2014-03-27 DIAGNOSIS — E876 Hypokalemia: Secondary | ICD-10-CM | POA: Diagnosis not present

## 2014-03-27 DIAGNOSIS — I480 Paroxysmal atrial fibrillation: Secondary | ICD-10-CM

## 2014-03-27 DIAGNOSIS — I10 Essential (primary) hypertension: Secondary | ICD-10-CM

## 2014-03-27 DIAGNOSIS — E871 Hypo-osmolality and hyponatremia: Secondary | ICD-10-CM | POA: Diagnosis not present

## 2014-03-27 DIAGNOSIS — Z79899 Other long term (current) drug therapy: Secondary | ICD-10-CM | POA: Diagnosis not present

## 2014-03-27 DIAGNOSIS — Z7901 Long term (current) use of anticoagulants: Secondary | ICD-10-CM | POA: Diagnosis not present

## 2014-03-27 DIAGNOSIS — Z853 Personal history of malignant neoplasm of breast: Secondary | ICD-10-CM | POA: Diagnosis not present

## 2014-03-27 DIAGNOSIS — I4891 Unspecified atrial fibrillation: Secondary | ICD-10-CM | POA: Diagnosis present

## 2014-03-27 DIAGNOSIS — Z9089 Acquired absence of other organs: Secondary | ICD-10-CM | POA: Diagnosis not present

## 2014-03-27 DIAGNOSIS — S3210XA Unspecified fracture of sacrum, initial encounter for closed fracture: Secondary | ICD-10-CM | POA: Diagnosis not present

## 2014-03-27 DIAGNOSIS — Y92009 Unspecified place in unspecified non-institutional (private) residence as the place of occurrence of the external cause: Secondary | ICD-10-CM | POA: Insufficient documentation

## 2014-03-27 DIAGNOSIS — M810 Age-related osteoporosis without current pathological fracture: Secondary | ICD-10-CM | POA: Diagnosis not present

## 2014-03-27 DIAGNOSIS — I482 Chronic atrial fibrillation, unspecified: Secondary | ICD-10-CM

## 2014-03-27 DIAGNOSIS — Z87891 Personal history of nicotine dependence: Secondary | ICD-10-CM | POA: Diagnosis not present

## 2014-03-27 DIAGNOSIS — Z9010 Acquired absence of unspecified breast and nipple: Secondary | ICD-10-CM | POA: Diagnosis not present

## 2014-03-27 DIAGNOSIS — W010XXA Fall on same level from slipping, tripping and stumbling without subsequent striking against object, initial encounter: Secondary | ICD-10-CM | POA: Diagnosis not present

## 2014-03-27 DIAGNOSIS — S34139A Unspecified injury to sacral spinal cord, initial encounter: Principal | ICD-10-CM | POA: Insufficient documentation

## 2014-03-27 DIAGNOSIS — S329XXA Fracture of unspecified parts of lumbosacral spine and pelvis, initial encounter for closed fracture: Secondary | ICD-10-CM | POA: Insufficient documentation

## 2014-03-27 DIAGNOSIS — S322XXA Fracture of coccyx, initial encounter for closed fracture: Principal | ICD-10-CM

## 2014-03-27 HISTORY — DX: Paroxysmal atrial fibrillation: I48.0

## 2014-03-27 HISTORY — DX: Cortical age-related cataract, unspecified eye: H25.019

## 2014-03-27 HISTORY — DX: Malignant neoplasm of unspecified site of unspecified female breast: C50.919

## 2014-03-27 HISTORY — DX: Age-related osteoporosis without current pathological fracture: M81.0

## 2014-03-27 LAB — COMPREHENSIVE METABOLIC PANEL
ALBUMIN: 3.5 g/dL (ref 3.5–5.2)
ALT: 13 U/L (ref 0–35)
AST: 28 U/L (ref 0–37)
Alkaline Phosphatase: 88 U/L (ref 39–117)
Anion gap: 14 (ref 5–15)
BILIRUBIN TOTAL: 0.7 mg/dL (ref 0.3–1.2)
BUN: 10 mg/dL (ref 6–23)
CALCIUM: 9 mg/dL (ref 8.4–10.5)
CHLORIDE: 96 meq/L (ref 96–112)
CO2: 25 mEq/L (ref 19–32)
Creatinine, Ser: 0.52 mg/dL (ref 0.50–1.10)
GFR calc Af Amer: >90 mL/min (ref >90)
GFR calc non Af Amer: 81 mL/min — ABNORMAL LOW (ref >90)
Glucose, Bld: 122 mg/dL — ABNORMAL HIGH (ref 70–99)
Potassium: 3.2 mEq/L — ABNORMAL LOW (ref 3.7–5.3)
SODIUM: 135 meq/L — AB (ref 137–147)
Total Protein: 7.2 g/dL (ref 6.0–8.3)

## 2014-03-27 LAB — CBC WITH DIFFERENTIAL/PLATELET
BASOS ABS: 0 10*3/uL (ref 0.0–0.1)
BASOS PCT: 0 % (ref 0–1)
Eosinophils Absolute: 0 10*3/uL (ref 0.0–0.7)
Eosinophils Relative: 0 % (ref 0–5)
HEMATOCRIT: 41.1 % (ref 36.0–46.0)
Hemoglobin: 14 g/dL (ref 12.0–15.0)
Lymphocytes Relative: 7 % — ABNORMAL LOW (ref 12–46)
Lymphs Abs: 1 10*3/uL (ref 0.7–4.0)
MCH: 31 pg (ref 26.0–34.0)
MCHC: 34.1 g/dL (ref 30.0–36.0)
MCV: 90.9 fL (ref 78.0–100.0)
MONO ABS: 0.6 10*3/uL (ref 0.1–1.0)
Monocytes Relative: 4 % (ref 3–12)
Neutro Abs: 13.6 10*3/uL — ABNORMAL HIGH (ref 1.7–7.7)
Neutrophils Relative %: 89 % — ABNORMAL HIGH (ref 43–77)
Platelets: 178 10*3/uL (ref 150–400)
RBC: 4.52 MIL/uL (ref 3.87–5.11)
RDW: 13 % (ref 11.5–15.5)
WBC: 15.3 10*3/uL — ABNORMAL HIGH (ref 4.0–10.5)

## 2014-03-27 LAB — I-STAT TROPONIN, ED: Troponin i, poc: 0 ng/mL (ref 0.00–0.08)

## 2014-03-27 LAB — URINALYSIS, ROUTINE W REFLEX MICROSCOPIC
Bilirubin Urine: NEGATIVE
Glucose, UA: NEGATIVE mg/dL
Hgb urine dipstick: NEGATIVE
Ketones, ur: NEGATIVE mg/dL
LEUKOCYTES UA: NEGATIVE
NITRITE: NEGATIVE
PROTEIN: NEGATIVE mg/dL
Specific Gravity, Urine: 1.009 (ref 1.005–1.030)
Urobilinogen, UA: 0.2 mg/dL (ref 0.0–1.0)
pH: 8 (ref 5.0–8.0)

## 2014-03-27 LAB — TYPE AND SCREEN
ABO/RH(D): O POS
Antibody Screen: NEGATIVE

## 2014-03-27 LAB — TROPONIN I: Troponin I: <0.3 ng/mL (ref <0.30)

## 2014-03-27 LAB — PROTIME-INR
INR: 1.86 — AB (ref 0.00–1.49)
PROTHROMBIN TIME: 21.4 s — AB (ref 11.6–15.2)

## 2014-03-27 MED ORDER — CALCIUM CARBONATE 1250 (500 CA) MG PO TABS
1.0000 | ORAL_TABLET | Freq: Every day | ORAL | Status: DC
  Administered 2014-03-28 – 2014-03-29 (×2): 500 mg via ORAL
  Filled 2014-03-27 (×4): qty 1

## 2014-03-27 MED ORDER — AMLODIPINE BESYLATE 2.5 MG PO TABS
2.5000 mg | ORAL_TABLET | Freq: Every evening | ORAL | Status: DC
  Administered 2014-03-28 (×2): 2.5 mg via ORAL
  Filled 2014-03-27 (×6): qty 1

## 2014-03-27 MED ORDER — FENTANYL CITRATE 0.05 MG/ML IJ SOLN: 50.0000 ug | INTRAMUSCULAR | Status: DC | PRN

## 2014-03-27 MED ORDER — ADULT MULTIVITAMIN W/MINERALS CH
1.0000 | ORAL_TABLET | Freq: Every morning | ORAL | Status: DC
  Administered 2014-03-27 – 2014-03-29 (×3): 1 via ORAL
  Filled 2014-03-27 (×4): qty 1

## 2014-03-27 MED ORDER — VITAMIN D 1000 UNITS PO TABS: 10000.0000 [IU] | ORAL_TABLET | ORAL | Status: DC

## 2014-03-27 MED ORDER — TETANUS-DIPHTH-ACELL PERTUSSIS 5-2.5-18.5 LF-MCG/0.5 IM SUSP
0.5000 mL | Freq: Once | INTRAMUSCULAR | Status: AC
  Administered 2014-03-27: 0.5 mL via INTRAMUSCULAR
  Filled 2014-03-27: qty 0.5

## 2014-03-27 MED ORDER — ALUM & MAG HYDROXIDE-SIMETH 200-200-20 MG/5ML PO SUSP: 30.0000 mL | Freq: Four times a day (QID) | ORAL | Status: DC | PRN

## 2014-03-27 MED ORDER — ONDANSETRON HCL 4 MG PO TABS
4.0000 mg | ORAL_TABLET | Freq: Four times a day (QID) | ORAL | Status: DC | PRN
  Administered 2014-03-27: 4 mg via ORAL
  Filled 2014-03-27: qty 1

## 2014-03-27 MED ORDER — SENNA 8.6 MG PO TABS
1.0000 | ORAL_TABLET | Freq: Two times a day (BID) | ORAL | Status: DC
  Administered 2014-03-27 – 2014-03-29 (×5): 8.6 mg via ORAL
  Filled 2014-03-27 (×5): qty 1

## 2014-03-27 MED ORDER — ACETAMINOPHEN 650 MG RE SUPP: 650.0000 mg | Freq: Four times a day (QID) | RECTAL | Status: DC | PRN

## 2014-03-27 MED ORDER — FENTANYL CITRATE 0.05 MG/ML IJ SOLN
50.0000 ug | Freq: Once | INTRAMUSCULAR | Status: AC
  Administered 2014-03-27: 50 ug via INTRAVENOUS
  Filled 2014-03-27: qty 2

## 2014-03-27 MED ORDER — ACETAMINOPHEN 325 MG PO TABS: 650.0000 mg | ORAL_TABLET | Freq: Four times a day (QID) | ORAL | Status: DC | PRN

## 2014-03-27 MED ORDER — HYDROCODONE-ACETAMINOPHEN 5-325 MG PO TABS
1.0000 | ORAL_TABLET | ORAL | Status: DC | PRN
  Administered 2014-03-28 (×2): 1 via ORAL
  Filled 2014-03-27 (×2): qty 1

## 2014-03-27 MED ORDER — WARFARIN - PHARMACIST DOSING INPATIENT
Freq: Every day | Status: DC
  Administered 2014-03-27: 18:00:00

## 2014-03-27 MED ORDER — METOPROLOL SUCCINATE ER 50 MG PO TB24
50.0000 mg | ORAL_TABLET | Freq: Every morning | ORAL | Status: DC
  Administered 2014-03-27 – 2014-03-29 (×3): 50 mg via ORAL
  Filled 2014-03-27 (×4): qty 1

## 2014-03-27 MED ORDER — WARFARIN SODIUM 2.5 MG PO TABS
3.7500 mg | ORAL_TABLET | Freq: Once | ORAL | Status: AC
  Administered 2014-03-27: 3.75 mg via ORAL
  Filled 2014-03-27: qty 1

## 2014-03-27 MED ORDER — LOSARTAN POTASSIUM 50 MG PO TABS
50.0000 mg | ORAL_TABLET | Freq: Every morning | ORAL | Status: DC
  Administered 2014-03-27 – 2014-03-29 (×3): 50 mg via ORAL
  Filled 2014-03-27 (×4): qty 1

## 2014-03-27 MED ORDER — VITAMIN E 45 MG (100 UNIT) PO CAPS
200.0000 [IU] | ORAL_CAPSULE | Freq: Every evening | ORAL | Status: DC
  Administered 2014-03-28 (×2): 200 [IU] via ORAL
  Filled 2014-03-27 (×6): qty 2

## 2014-03-27 MED ORDER — SODIUM CHLORIDE 0.9 % IJ SOLN
3.0000 mL | Freq: Two times a day (BID) | INTRAMUSCULAR | Status: DC
  Administered 2014-03-28 – 2014-03-29 (×2): 3 mL via INTRAVENOUS

## 2014-03-27 MED ORDER — POTASSIUM CHLORIDE 10 MEQ/100ML IV SOLN
10.0000 meq | INTRAVENOUS | Status: AC
  Administered 2014-03-27 – 2014-03-28 (×4): 10 meq via INTRAVENOUS
  Filled 2014-03-27 (×4): qty 100

## 2014-03-27 MED ORDER — ONDANSETRON HCL 4 MG/2ML IJ SOLN
4.0000 mg | Freq: Once | INTRAMUSCULAR | Status: AC
  Administered 2014-03-27: 4 mg via INTRAVENOUS
  Filled 2014-03-27: qty 2

## 2014-03-27 MED ORDER — SODIUM CHLORIDE 0.9 % IJ SOLN: 3.0000 mL | INTRAMUSCULAR | Status: DC | PRN

## 2014-03-27 MED ORDER — ONDANSETRON HCL 4 MG/2ML IJ SOLN: 4.0000 mg | Freq: Four times a day (QID) | INTRAMUSCULAR | Status: DC | PRN

## 2014-03-27 MED ORDER — MORPHINE SULFATE 2 MG/ML IJ SOLN: 2.0000 mg | INTRAMUSCULAR | Status: DC | PRN

## 2014-03-27 MED ORDER — SODIUM CHLORIDE 0.9 % IJ SOLN: 3.0000 mL | Freq: Two times a day (BID) | INTRAMUSCULAR | Status: DC

## 2014-03-27 MED ORDER — ONDANSETRON HCL 4 MG/2ML IJ SOLN: 4.0000 mg | Freq: Three times a day (TID) | INTRAMUSCULAR | Status: DC | PRN

## 2014-03-27 MED ORDER — SODIUM CHLORIDE 0.9 % IV SOLN: 250.0000 mL | INTRAVENOUS | Status: DC | PRN

## 2014-03-27 NOTE — Consult Note (Signed)
ORTHOPAEDIC CONSULTATION  REQUESTING PHYSICIAN: Wandra Arthurs, MD  Chief Complaint: Sacral fracture, Right  HPI: Madeline Booth is a 80 y.o. female who complains of  Pain in the R backside after a mechanical fall.   Past Medical History  Diagnosis Date  . Hypertension   . Intertrochanteric fracture of right femur 09/21/2011  . Wears glasses   . Cancer     breast cancer  . Dysrhythmia     PAF noted on 07/13/2013 note   Past Surgical History  Procedure Laterality Date  . Femur im nail  09/21/2011    Procedure: INTRAMEDULLARY (IM) NAIL FEMORAL;  Surgeon: Johnny Bridge;  Location: [facility];  Service: Orthopedics;  Laterality: Right;  . Appendectomy    . Tonsillectomy    . Dilation and curettage of uterus    . Sigmoidoscopy    . Mastectomy  1981    right  . Breast lumpectomy with axillary lymph node biopsy  2006    left-Dr Young[facility]  . Open reduction internal fixation (orif) metacarpal Left 12/30/2013    Procedure: OPEN REDUCTION INTERNAL FIXATION (ORIF) METACARPAL DISLOCATION AND COLLATERAL LIGAMENT REPAIR;  Surgeon: [name], MD;  Location: [facility];  Service: Orthopedics;  Laterality: Left;  Open reduction left long finger metacarpophalangeal dislocation   History   Social History  . Marital Status: Single    Spouse Name: N/A    Number of Children: N/A  . Years of Education: N/A   Social History Main Topics  . Smoking status: Never Smoker   . Smokeless tobacco: None  . Alcohol Use: No  . Drug Use: None  . Sexual Activity: None   Other Topics Concern  . None   Social History Narrative  . None   No family history on file. No Known Allergies Prior to Admission medications   Medication Sig Start Date End Date Taking? Authorizing Provider  alendronate (FOSAMAX) 70 MG tablet Take 70 mg by mouth every Sunday. Take with a full glass of water on an empty stomach.   Yes Historical Provider, MD  amLODipine (NORVASC) 5 MG tablet Take 2.5 mg by  mouth every evening.    Yes Historical Provider, MD  Calcium-Vitamin D (CALTRATE 600 PLUS-VIT D PO) Take 1 tablet by mouth 2 (two) times daily.   Yes Historical Provider, MD  Cholecalciferol (MAXIMUM D3) 10000 UNITS CAPS Take 10,000 Units by mouth every Wednesday.   Yes Historical Provider, MD  losartan (COZAAR) 50 MG tablet Take 50 mg by mouth every morning.    Yes Historical Provider, MD  metoprolol succinate (TOPROL-XL) 50 MG 24 hr tablet Take 50 mg by mouth every morning. Take with or immediately following a meal.   Yes Historical Provider, MD  Multiple Vitamins-Minerals (CENTRUM SILVER) tablet Take 1 tablet by mouth every morning.   Yes Historical Provider, MD  vitamin E 200 UNIT capsule Take 200 Units by mouth every evening.   Yes Historical Provider, MD  warfarin (COUMADIN) 2.5 MG tablet Take 2.5 mg by mouth every morning.    Yes Historical Provider, MD   Dg Hip Complete Right  03/27/2014   CLINICAL DATA:  Fall. Right hip pain. Prior ORIF of an intertrochanteric right femoral neck fracture in January, 2013.  EXAM: RIGHT HIP - COMPLETE 2+ VIEW  COMPARISON:  09/21/2011, 09/20/2011.  FINDINGS: Prior ORIF of the intertrochanteric right femoral neck fracture with healing. Dystrophic calcification/myositis ossificans above the greater trochanter. No evidence of acute fracture or  dislocation. Hip joint anatomically aligned with mild joint space narrowing, unchanged.  Included AP pelvis demonstrates well-preserved joint space in the contralateral left hip. Interval healed fracture involving the left inferior pubic ramus. Degenerative changes involving the sacroiliac joints and symphysis pubis, unchanged. Degenerative changes involving the visualized lower lumbar spine, unchanged.  IMPRESSION: 1. No acute osseous abnormality. 2. ORIF of a prior right femoral neck fracture with healing. 3. Mild osteoarthritis involving the right hip. 4. Healed left inferior pubic ramus fracture.   Electronically Signed   By:  Evangeline Dakin M.D.   On: 03/27/2014 09:07   Ct Head Wo Contrast  03/27/2014   CLINICAL DATA:  Fall.  Coumadin therapy.  EXAM: CT HEAD WITHOUT CONTRAST  CT CERVICAL SPINE WITHOUT CONTRAST  TECHNIQUE: Multidetector CT imaging of the head and cervical spine was performed following the standard protocol without intravenous contrast. Multiplanar CT image reconstructions of the cervical spine were also generated.  COMPARISON:  CT 07/14/2013.  FINDINGS: CT HEAD FINDINGS  No mass. No hydrocephalus. No hemorrhage. No acute bony abnormality .  CT CERVICAL SPINE FINDINGS  Carotid atherosclerotic vascular calcification. Nonspecific heterogeneous thyroid gland. Soft tissue structures are otherwise unremarkable. Diffuse degenerative changes cervical spine. Slight deformity noted of the odontoid process is unchanged. No acute bony abnormality.  IMPRESSION: 1. No acute intracranial abnormality 2. Diffuse degenerative change of the cervical spine. No acute abnormality.   Electronically Signed   By: Carlyss   On: 03/27/2014 09:38   Ct Cervical Spine Wo Contrast  03/27/2014   CLINICAL DATA:  Fall.  Coumadin therapy.  EXAM: CT HEAD WITHOUT CONTRAST  CT CERVICAL SPINE WITHOUT CONTRAST  TECHNIQUE: Multidetector CT imaging of the head and cervical spine was performed following the standard protocol without intravenous contrast. Multiplanar CT image reconstructions of the cervical spine were also generated.  COMPARISON:  CT 07/14/2013.  FINDINGS: CT HEAD FINDINGS  No mass. No hydrocephalus. No hemorrhage. No acute bony abnormality .  CT CERVICAL SPINE FINDINGS  Carotid atherosclerotic vascular calcification. Nonspecific heterogeneous thyroid gland. Soft tissue structures are otherwise unremarkable. Diffuse degenerative changes cervical spine. Slight deformity noted of the odontoid process is unchanged. No acute bony abnormality.  IMPRESSION: 1. No acute intracranial abnormality 2. Diffuse degenerative change of the  cervical spine. No acute abnormality.   Electronically Signed   By: Marcello Moores  Register   On: 03/27/2014 09:38   Ct Pelvis Wo Contrast  03/27/2014   CLINICAL DATA:  Right hip pain after fall.  EXAM: CT PELVIS WITHOUT CONTRAST  TECHNIQUE: Multidetector CT imaging of the pelvis was performed following the standard protocol without intravenous contrast.  COMPARISON:  None.  FINDINGS: Status post internal fixation of old right proximal femoral neck fracture. Old fractures of the left superior and inferior pubic rami are noted. Left hip joint appears normal. Sacroiliac joints appear normal. Nondisplaced acute fracture is seen involving the anterior and superior portion of the right sacrum. Degenerative changes are noted in lower lumbar spine stool is noted in the rectum and sigmoid colon. No abnormal fluid collection is noted in the peritoneal space.  IMPRESSION: Status post surgical internal fixation of old proximal right femoral neck fracture. Old healed fractures of the left superior and inferior pubic rami are noted. Nondisplaced acute fracture involving anterior and superior aspect of right side of sacrum.   Electronically Signed   By: Sabino Dick M.D.   On: 03/27/2014 10:56    Positive ROS: All other systems have been reviewed and were otherwise  negative with the exception of those mentioned in the HPI and as above.  Labs cbc  Recent Labs  03/27/14 0815  WBC 15.3*  HGB 14.0  HCT 41.1  PLT 178    Labs inflam No results found for this basename: ESR, CRP,  in the last 72 hours  Labs coag  Recent Labs  03/27/14 0815  INR 1.86*     Recent Labs  03/27/14 0815  NA 135*  K 3.2*  CL 96  CO2 25  GLUCOSE 122*  BUN 10  CREATININE 0.52  CALCIUM 9.0    Physical Exam: Filed Vitals:   03/27/14 1045  BP:   Pulse: 74  Temp:   Resp: 15   General: Alert, no acute distress Cardiovascular: No pedal edema Respiratory: No cyanosis, no use of accessory musculature GI: No organomegaly,  abdomen is soft and non-tender Skin: No lesions in the area of chief complaint Neurologic: Sensation intact distally Psychiatric: Patient is competent for consent with normal mood and affect Lymphatic: No axillary or cervical lymphadenopathy  MUSCULOSKELETAL:  BLE: SILT DP/SP/S/S/T nerve, 2+ DP, +TA/GS/EHL Compartments soft Painless ROM No Crepitous  Other extremities are atraumatic with painless ROM and NVI.  Assessment: R nondisplaced ant sacral fx  Plan: Non-op treatment Weight Bearing Status: WBAT PT to mobilize VTE px: SCD's and chemical per hospitalist team  I will sign off at this point, f/u with me in 2wks for repeat exam.    Edmonia Lynch, D, MD Cell [phone]   03/27/2014 11:59 AM

## 2014-03-27 NOTE — H&P (Addendum)
History and Physical:    Madeline Booth WNI:[id number] DOB: 06-Dec-1919 DOA: 03/27/2014  Referring physician: Dr. Shirlyn Goltz PCP: [name], MD   Chief Complaint: Fall, tail bone pain  History of Present Illness:   Madeline Booth is an 80 y.o. female with a PMH of atrial fibrillation on chronic coumadin, HTN, prior history of breast cancer status post right mastectomy,  who presented to the ER after suffering from a mechanical fall at home.  She reports that she was walking to the bathroom with the lights off and tripped over the door frame and hit the right side of her head and fell on her sacrum. There was no loss of consciousness.  She complains of a sharp pain in her sacral area when she attempts to move, but is able to ambulate with a walker slowly and with assistance.  She has had some nausea and vomiting, and vomited after ambulating to the bathroom.     ROS:   Constitutional: No fever, no chills;  Appetite normal; + minor weight loss, no fatigue, active with yard work.  HEENT: No blurry vision, no diplopia, no pharyngitis, no dysphagia CV: No chest pain, no palpitations, no PND, no orthopnea, no edema.  Resp: No SOB, no cough, no pleuritic pain. GI: + nausea, + vomiting, no diarrhea, no melena, no hematochezia, + chronic constipation (LBM 2 days ago), no abdominal pain.  GU: No dysuria, no hematuria, no frequency, no urgency. MSK: no myalgias, + sacral arthralgias.  Neuro:  No headache, no focal neurological deficits, no history of seizures.  Psych: No depression, no anxiety.  Endo: No heat intolerance, + cold intolerance, no polyuria, no polydipsia  Skin: No rashes, no skin lesions.  Heme: + easy bruising.  Travel history: No recent travel.   Past Medical History:   Past Medical History  Diagnosis Date  . Hypertension   . Intertrochanteric fracture of right femur 09/21/2011  . Wears glasses   . Breast cancer     Right, s/p mastectomy  . PAF (paroxysmal atrial  fibrillation)     On chronic coumadin  . Cataract cortical, senile   . Osteoporosis     Past Surgical History:   Past Surgical History  Procedure Laterality Date  . Femur im nail  09/21/2011    Procedure: INTRAMEDULLARY (IM) NAIL FEMORAL;  Surgeon: Johnny Bridge;  Location: [facility];  Service: Orthopedics;  Laterality: Right;  . Appendectomy    . Tonsillectomy    . Dilation and curettage of uterus    . Sigmoidoscopy    . Mastectomy Right 1981  . Breast lumpectomy with axillary lymph node biopsy Left 2006    left-Dr Young-[facility]  . Open reduction internal fixation (orif) metacarpal Left 12/30/2013    Procedure: OPEN REDUCTION INTERNAL FIXATION (ORIF) METACARPAL DISLOCATION AND COLLATERAL LIGAMENT REPAIR;  Surgeon: [name], MD;  Location: [place];  Service: Orthopedics;  Laterality: Left;  Open reduction left long finger metacarpophalangeal dislocation    Social History:   History   Social History  . Marital Status: Single    Spouse Name: N/A    Number of Children: N/A  . Years of Education: N/A   Occupational History  . Retired Art therapist    Social History Main Topics  . Smoking status: Former Smoker    Types: Cigarettes    Quit date: 03/27/1980  . Smokeless tobacco: Never Used  . Alcohol Use: Yes     Comment: Occasional wine  .  Drug Use: No  . Sexual Activity: Not on file   Other Topics Concern  . Not on file   Social History Narrative   Single, never married, lives alone.  Independent prior ADLs.    Family history:   Family History  Problem Relation Age of Onset  . Cancer      Neg hx  . Aneurysm Father   . Parkinson's disease Sister     Allergies   Review of patient's allergies indicates no known allergies.  Current Medications:   Prior to Admission medications   Medication Sig Start Date End Date Taking? Authorizing Provider  alendronate (FOSAMAX) 70 MG tablet Take 70 mg by mouth every Sunday. Take with a full glass of  water on an empty stomach.   Yes Historical Provider, MD  amLODipine (NORVASC) 5 MG tablet Take 2.5 mg by mouth every evening.    Yes Historical Provider, MD  Calcium-Vitamin D (CALTRATE 600 PLUS-VIT D PO) Take 1 tablet by mouth 2 (two) times daily.   Yes Historical Provider, MD  Cholecalciferol (MAXIMUM D3) 10000 UNITS CAPS Take 10,000 Units by mouth every Wednesday.   Yes Historical Provider, MD  losartan (COZAAR) 50 MG tablet Take 50 mg by mouth every morning.    Yes Historical Provider, MD  metoprolol succinate (TOPROL-XL) 50 MG 24 hr tablet Take 50 mg by mouth every morning. Take with or immediately following a meal.   Yes Historical Provider, MD  Multiple Vitamins-Minerals (CENTRUM SILVER) tablet Take 1 tablet by mouth every morning.   Yes Historical Provider, MD  vitamin E 200 UNIT capsule Take 200 Units by mouth every evening.   Yes Historical Provider, MD  warfarin (COUMADIN) 2.5 MG tablet Take 2.5 mg by mouth every morning.    Yes Historical Provider, MD    Physical Exam:   Filed Vitals:   03/27/14 1000 03/27/14 1045 03/27/14 1200 03/27/14 1308  BP: 158/70   143/83  Pulse:  74 77 77  Temp:    98.2 F (36.8 C)  TempSrc:    Oral  Resp: 18 15 18 18   Height:    5\' 4"  (1.626 m)  Weight:    64.229 kg (141 lb 9.6 oz)  SpO2:  94% 93% 95%     Physical Exam: Blood pressure 143/83, pulse 77, temperature 98.2 F (36.8 C), temperature source Oral, resp. rate 18, height 5\' 4"  (1.626 m), weight 64.229 kg (141 lb 9.6 oz), SpO2 95.00%. Gen: No acute distress. Head: Normocephalic, atraumatic. Eyes: PERRL, EOMI, sclerae nonicteric. Mouth: Oropharynx clear with no posterior pharyngeal exudates. Neck: Supple, no thyromegaly, no lymphadenopathy, no jugular venous distention. Chest: Lungs clear to auscultation bilaterally. CV: Heart sounds are regular with a soft grade 2/6 systolic ejection murmur. Abdomen: Soft, nontender, nondistended with normal active bowel sounds. Extremities:  Extremities Skin: Warm and dry. Neuro: Alert and oriented times 3; cranial nerves II through XII grossly intact. Psych: Mood and affect normal.   Data Review:    Labs: Basic Metabolic Panel:  Recent Labs Lab 03/27/14 0815  NA 135*  K 3.2*  CL 96  CO2 25  GLUCOSE 122*  BUN 10  CREATININE 0.52  CALCIUM 9.0   Liver Function Tests:  Recent Labs Lab 03/27/14 0815  AST 28  ALT 13  ALKPHOS 88  BILITOT 0.7  PROT 7.2  ALBUMIN 3.5   CBC:  Recent Labs Lab 03/27/14 0815  WBC 15.3*  NEUTROABS 13.6*  HGB 14.0  HCT 41.1  MCV 90.9  PLT  178    BNP (last 3 results)  Recent Labs  07/14/13 0305  PROBNP 734.3*   CBG: No results found for this basename: GLUCAP,  in the last 168 hours  Radiographic Studies: Dg Hip Complete Right  03/27/2014   CLINICAL DATA:  Fall. Right hip pain. Prior ORIF of an intertrochanteric right femoral neck fracture in January, 2013.  EXAM: RIGHT HIP - COMPLETE 2+ VIEW  COMPARISON:  09/21/2011, 09/20/2011.  FINDINGS: Prior ORIF of the intertrochanteric right femoral neck fracture with healing. Dystrophic calcification/myositis ossificans above the greater trochanter. No evidence of acute fracture or dislocation. Hip joint anatomically aligned with mild joint space narrowing, unchanged.  Included AP pelvis demonstrates well-preserved joint space in the contralateral left hip. Interval healed fracture involving the left inferior pubic ramus. Degenerative changes involving the sacroiliac joints and symphysis pubis, unchanged. Degenerative changes involving the visualized lower lumbar spine, unchanged.  IMPRESSION: 1. No acute osseous abnormality. 2. ORIF of a prior right femoral neck fracture with healing. 3. Mild osteoarthritis involving the right hip. 4. Healed left inferior pubic ramus fracture.   Electronically Signed   By: Evangeline Dakin M.D.   On: 03/27/2014 09:07   Ct Head Wo Contrast  03/27/2014   CLINICAL DATA:  Fall.  Coumadin therapy.   EXAM: CT HEAD WITHOUT CONTRAST  CT CERVICAL SPINE WITHOUT CONTRAST  TECHNIQUE: Multidetector CT imaging of the head and cervical spine was performed following the standard protocol without intravenous contrast. Multiplanar CT image reconstructions of the cervical spine were also generated.  COMPARISON:  CT 07/14/2013.  FINDINGS: CT HEAD FINDINGS  No mass. No hydrocephalus. No hemorrhage. No acute bony abnormality .  CT CERVICAL SPINE FINDINGS  Carotid atherosclerotic vascular calcification. Nonspecific heterogeneous thyroid gland. Soft tissue structures are otherwise unremarkable. Diffuse degenerative changes cervical spine. Slight deformity noted of the odontoid process is unchanged. No acute bony abnormality.  IMPRESSION: 1. No acute intracranial abnormality 2. Diffuse degenerative change of the cervical spine. No acute abnormality.   Electronically Signed   By: Baden   On: 03/27/2014 09:38   Ct Cervical Spine Wo Contrast  03/27/2014   CLINICAL DATA:  Fall.  Coumadin therapy.  EXAM: CT HEAD WITHOUT CONTRAST  CT CERVICAL SPINE WITHOUT CONTRAST  TECHNIQUE: Multidetector CT imaging of the head and cervical spine was performed following the standard protocol without intravenous contrast. Multiplanar CT image reconstructions of the cervical spine were also generated.  COMPARISON:  CT 07/14/2013.  FINDINGS: CT HEAD FINDINGS  No mass. No hydrocephalus. No hemorrhage. No acute bony abnormality .  CT CERVICAL SPINE FINDINGS  Carotid atherosclerotic vascular calcification. Nonspecific heterogeneous thyroid gland. Soft tissue structures are otherwise unremarkable. Diffuse degenerative changes cervical spine. Slight deformity noted of the odontoid process is unchanged. No acute bony abnormality.  IMPRESSION: 1. No acute intracranial abnormality 2. Diffuse degenerative change of the cervical spine. No acute abnormality.   Electronically Signed   By: Marcello Moores  Register   On: 03/27/2014 09:38   Ct Pelvis Wo  Contrast  03/27/2014   CLINICAL DATA:  Right hip pain after fall.  EXAM: CT PELVIS WITHOUT CONTRAST  TECHNIQUE: Multidetector CT imaging of the pelvis was performed following the standard protocol without intravenous contrast.  COMPARISON:  None.  FINDINGS: Status post internal fixation of old right proximal femoral neck fracture. Old fractures of the left superior and inferior pubic rami are noted. Left hip joint appears normal. Sacroiliac joints appear normal. Nondisplaced acute fracture is seen involving the anterior and superior  portion of the right sacrum. Degenerative changes are noted in lower lumbar spine stool is noted in the rectum and sigmoid colon. No abnormal fluid collection is noted in the peritoneal space.  IMPRESSION: Status post surgical internal fixation of old proximal right femoral neck fracture. Old healed fractures of the left superior and inferior pubic rami are noted. Nondisplaced acute fracture involving anterior and superior aspect of right side of sacrum.   Electronically Signed   By: Sabino Dick M.D.   On: 03/27/2014 10:56    EKG: Independently reviewed. Sinus rhythm, Atrial premature complex, Prolonged PR interval, LVH by voltage, Nonspecific T abnormalities, inferior leads (more pronounced when compared to prior).   Assessment/Plan:   Principal Problem:   Fracture of sacrum  Admitted for pain control and physical therapy evaluation.  No evidence of syncopal event.  Status post orthopedic evaluation. Nonoperative management.  Physical therapy/occupational therapy evaluations requested.  Active Problems:   Essential hypertension  Continue metoprolol, Norvasc and Cozaar.    Atrial fibrillation  Continue Coumadin per pharmacy dosing. Currently in normal sinus rhythm.    Nausea and vomiting  Possibly related to pain medications. Nausea medication ordered as needed.    EKG abnormalities  Monitor on telemetry.  Check troponin. No complaints of chest  pain.    Hypokalemia  We'll give for runs of IV potassium since she has had some active nausea and vomiting.    Leukocytosis, unspecified  Afebrile. Urinalysis negative for nitrites and leukocytes. Culture pending. No symptoms suggestive of pneumonia.  Repeat CBC in the morning.    Hyponatremia  Mild. Monitor.    DVT prophylaxis  Lovenox ordered.  Code Status: Full. Family Communication: No family at the bedside. Disposition Plan: Home when stable.  Time spent: 70 minutes.  Caulin Begley [facility] Pager [phone] Cell: [phone]   If 7PM-7AM, please contact night-coverage [email or web address] Password [email or web address] 03/27/2014, 5:56 PM    **Disclaimer: This note was dictated with voice recognition software. Similar sounding words can inadvertently be transcribed and this note may contain transcription errors which may not have been corrected upon publication of note.**

## 2014-03-27 NOTE — ED Notes (Signed)
Bed: LL97 Expected date:  Expected time:  Means of arrival:  Comments: EMS- 80 yo, fall, head pain

## 2014-03-27 NOTE — ED Notes (Signed)
Patient transported to X-ray and CT 

## 2014-03-27 NOTE — Progress Notes (Signed)
ANTICOAGULATION CONSULT NOTE - Initial Consult  Pharmacy Consult for Warfarin Indication: atrial fibrillation  No Known Allergies  Patient Measurements: Height: 5\' 4"  (162.6 cm) Weight: 141 lb 9.6 oz (64.229 kg) IBW/kg (Calculated) : 54.7   Vital Signs: Temp: 98.2 F (36.8 C) (07/16 1308) Temp src: Oral (07/16 1308) BP: 143/83 mmHg (07/16 1308) Pulse Rate: 77 (07/16 1308)  Labs:  Recent Labs  03/27/14 0815  HGB 14.0  HCT 41.1  PLT 178  LABPROT 21.4*  INR 1.86*  CREATININE 0.52    Estimated Creatinine Clearance: 39.6 ml/min (by C-G formula based on Cr of 0.52).   Medical History: Past Medical History  Diagnosis Date  . Hypertension   . Intertrochanteric fracture of right femur 09/21/2011  . Wears glasses   . Cancer     breast cancer  . Dysrhythmia     PAF noted on 07/13/2013 note      Assessment: 62 yoF on warfarin PTA for atrial fibrillation was admitted today s/p fall with sacral fracture.  Presented with abrasion/hematoma on right side of head as well.  CT head was normal.  Pharmacy consulted to resume warfarin.  Ortho consulted.  Home dose reported as 2.5 mg daily.  Last dose 7/15.  INR subtherapeutic on admission at 1.86.  CBC WNL  Goal of Therapy:  INR 2-3   Plan:  No notes from MD yet.  Waiting on evaluation and plan from ortho regarding potential surgery before administering further warfarin. Will order daily INR for now.  Hershal Coria 03/27/2014,1:39 PM

## 2014-03-27 NOTE — Progress Notes (Signed)
Discussed admission status with Dr. Conley Canal.

## 2014-03-27 NOTE — ED Provider Notes (Signed)
Medical screening examination/treatment/procedure(s) were conducted as a shared visit with non-physician practitioner(s) and myself.  I personally evaluated the patient during the encounter.   EKG Interpretation None      Madeline Booth is a 80 y.o. female hx of HTN here with fall. Mechanical fall and hit her head. Patient on coumadin for afib. Also has R hip pain afterwards and was unable to walk. Xray showed no obvious fracture, CT head and neck unremarkable. However, still unable to walk. CT showed sacral fracture. Will admit for pain control. Ortho consulted    Wandra Arthurs, MD 03/27/14 [phone]

## 2014-03-27 NOTE — ED Notes (Signed)
Patient transported to CT 

## 2014-03-27 NOTE — ED Provider Notes (Signed)
CSN: [id number]     Arrival date & time 03/27/14  0867 History   First MD Initiated Contact with Patient 03/27/14 0755     Chief Complaint  Patient presents with  . Fall  . Headache     (Consider location/radiation/quality/duration/timing/severity/associated sxs/prior Treatment) HPI Comments: Patient is a 80 year old female with history of hypertension, breast cancer, paroxysmal A. fib who presents to the emergency department today after a fall. She reports that she was walking to the bathroom with the lights off. She tripped over the door frame and hit the right side of her head. She also fell on her hip. There is no loss of consciousness. She denies headache, blurry vision, double vision, photophobia. She is a sharp pain in her right hip when she attempts to move. She has had prior surgery on his hip by Dr. Mardelle Matte. She is on Coumadin and last had her INR checked in June. She lives at home alone.   The history is provided by the patient. No language interpreter was used.    Past Medical History  Diagnosis Date  . Hypertension   . Intertrochanteric fracture of right femur 09/21/2011  . Wears glasses   . Cancer     breast cancer  . Dysrhythmia     PAF noted on 07/13/2013 note   Past Surgical History  Procedure Laterality Date  . Femur im nail  09/21/2011    Procedure: INTRAMEDULLARY (IM) NAIL FEMORAL;  Surgeon: Johnny Bridge;  Location: [facility];  Service: Orthopedics;  Laterality: Right;  . Appendectomy    . Tonsillectomy    . Dilation and curettage of uterus    . Sigmoidoscopy    . Mastectomy  1981    right  . Breast lumpectomy with axillary lymph node biopsy  2006    left-Dr Young-[facility]  . Open reduction internal fixation (orif) metacarpal Left 12/30/2013    Procedure: OPEN REDUCTION INTERNAL FIXATION (ORIF) METACARPAL DISLOCATION AND COLLATERAL LIGAMENT REPAIR;  Surgeon: [name], MD;  Location: [place];  Service: Orthopedics;  Laterality: Left;  Open  reduction left long finger metacarpophalangeal dislocation   No family history on file. History  Substance Use Topics  . Smoking status: Never Smoker   . Smokeless tobacco: Not on file  . Alcohol Use: No   OB History   Grav Para Term Preterm Abortions TAB SAB Ect Mult Living                 Review of Systems  Respiratory: Negative for shortness of breath.   Cardiovascular: Negative for chest pain.  Gastrointestinal: Positive for nausea. Negative for vomiting and abdominal pain.  Musculoskeletal: Positive for arthralgias and myalgias.  Neurological: Negative for syncope and headaches.  All other systems reviewed and are negative.     Allergies  Review of patient's allergies indicates no known allergies.  Home Medications   Prior to Admission medications   Medication Sig Start Date End Date Taking? Authorizing Provider  alendronate (FOSAMAX) 70 MG tablet Take 70 mg by mouth every 7 (seven) days. Takes every Sunday.Marland KitchenMarland KitchenTake with a full glass of water on an empty stomach.     Historical Provider, MD  amLODipine (NORVASC) 5 MG tablet Take 2.5 mg by mouth daily. Takes a half    Historical Provider, MD  Calcium-Vitamin D (CALTRATE 600 PLUS-VIT D PO) Take 1 tablet by mouth 2 (two) times daily.    Historical Provider, MD  Cholecalciferol (MAXIMUM D3) 10000 UNITS CAPS Take 10,000 Units  by mouth every Wednesday.    Historical Provider, MD  HYDROcodone-acetaminophen (NORCO) 5-325 MG per tablet Take 1 tablet by mouth every 6 (six) hours as needed for moderate pain. 12/30/13   [name], MD  losartan (COZAAR) 50 MG tablet Take 50 mg by mouth daily.    Historical Provider, MD  metoprolol succinate (TOPROL-XL) 50 MG 24 hr tablet Take 1 tablet (50 mg total) by mouth daily. 09/26/11   Verlee Monte, MD  warfarin (COUMADIN) 2.5 MG tablet Take 2.5 mg by mouth daily.      Historical Provider, MD   BP 141/92  Pulse 78  Temp(Src) 98.3 F (36.8 C) (Oral)  Resp 18  SpO2 93% Physical Exam    Nursing note and vitals reviewed. Constitutional: She is oriented to person, place, and time. She appears well-developed and well-nourished. No distress.  HENT:  Head: Normocephalic.  Right Ear: External ear normal.  Left Ear: External ear normal.  Nose: Nose normal.  Mouth/Throat: Oropharynx is clear and moist.  Small contusion to forehead. Hematoma with overlying abrasion to right scalp. No lacerations  Eyes: Conjunctivae and EOM are normal. Pupils are equal, round, and reactive to light.  Neck: Normal range of motion.  Cardiovascular: Normal rate, regular rhythm, normal heart sounds, intact distal pulses and normal pulses.   Pulses:      Dorsalis pedis pulses are 2+ on the right side, and 2+ on the left side.       Posterior tibial pulses are 2+ on the right side, and 2+ on the left side.  Pulmonary/Chest: Effort normal and breath sounds normal. No stridor. No respiratory distress. She has no wheezes. She has no rales.  Abdominal: Soft. She exhibits no distension.  Musculoskeletal: Normal range of motion.       Back:  Tender to palpation over right lateral hip and sacrum. No deformity.  Patient moves all 4 extremities without ataxia.   Neurological: She is alert and oriented to person, place, and time. She has normal strength. No sensory deficit. Coordination normal. GCS eye subscore is 4. GCS verbal subscore is 5. GCS motor subscore is 6.  Finger nose finger normal. Unable to test grip strength in left hand due to pain from prior finger surgery. Strength 5/5 on right Patient is unable to ambulate.  Skin: Skin is warm and dry. She is not diaphoretic. No erythema.  Psychiatric: She has a normal mood and affect. Her behavior is normal.    ED Course  Procedures (including critical care time) Labs Review Labs Reviewed  CBC WITH DIFFERENTIAL - Abnormal; Notable for the following:    WBC 15.3 (*)    Neutrophils Relative % 89 (*)    Neutro Abs 13.6 (*)    Lymphocytes Relative 7  (*)    All other components within normal limits  COMPREHENSIVE METABOLIC PANEL - Abnormal; Notable for the following:    Sodium 135 (*)    Potassium 3.2 (*)    Glucose, Bld 122 (*)    GFR calc non Af Amer 81 (*)    All other components within normal limits  PROTIME-INR - Abnormal; Notable for the following:    Prothrombin Time 21.4 (*)    INR 1.86 (*)    All other components within normal limits  URINE CULTURE  URINALYSIS, ROUTINE W REFLEX MICROSCOPIC  I-STAT TROPOININ, ED  TYPE AND SCREEN    Imaging Review Dg Hip Complete Right  03/27/2014   CLINICAL DATA:  Fall. Right hip pain. Prior  ORIF of an intertrochanteric right femoral neck fracture in January, 2013.  EXAM: RIGHT HIP - COMPLETE 2+ VIEW  COMPARISON:  09/21/2011, 09/20/2011.  FINDINGS: Prior ORIF of the intertrochanteric right femoral neck fracture with healing. Dystrophic calcification/myositis ossificans above the greater trochanter. No evidence of acute fracture or dislocation. Hip joint anatomically aligned with mild joint space narrowing, unchanged.  Included AP pelvis demonstrates well-preserved joint space in the contralateral left hip. Interval healed fracture involving the left inferior pubic ramus. Degenerative changes involving the sacroiliac joints and symphysis pubis, unchanged. Degenerative changes involving the visualized lower lumbar spine, unchanged.  IMPRESSION: 1. No acute osseous abnormality. 2. ORIF of a prior right femoral neck fracture with healing. 3. Mild osteoarthritis involving the right hip. 4. Healed left inferior pubic ramus fracture.   Electronically Signed   By: Evangeline Dakin M.D.   On: 03/27/2014 09:07   Ct Head Wo Contrast  03/27/2014   CLINICAL DATA:  Fall.  Coumadin therapy.  EXAM: CT HEAD WITHOUT CONTRAST  CT CERVICAL SPINE WITHOUT CONTRAST  TECHNIQUE: Multidetector CT imaging of the head and cervical spine was performed following the standard protocol without intravenous contrast. Multiplanar CT  image reconstructions of the cervical spine were also generated.  COMPARISON:  CT 07/14/2013.  FINDINGS: CT HEAD FINDINGS  No mass. No hydrocephalus. No hemorrhage. No acute bony abnormality .  CT CERVICAL SPINE FINDINGS  Carotid atherosclerotic vascular calcification. Nonspecific heterogeneous thyroid gland. Soft tissue structures are otherwise unremarkable. Diffuse degenerative changes cervical spine. Slight deformity noted of the odontoid process is unchanged. No acute bony abnormality.  IMPRESSION: 1. No acute intracranial abnormality 2. Diffuse degenerative change of the cervical spine. No acute abnormality.   Electronically Signed   By: Kyle   On: 03/27/2014 09:38   Ct Cervical Spine Wo Contrast  03/27/2014   CLINICAL DATA:  Fall.  Coumadin therapy.  EXAM: CT HEAD WITHOUT CONTRAST  CT CERVICAL SPINE WITHOUT CONTRAST  TECHNIQUE: Multidetector CT imaging of the head and cervical spine was performed following the standard protocol without intravenous contrast. Multiplanar CT image reconstructions of the cervical spine were also generated.  COMPARISON:  CT 07/14/2013.  FINDINGS: CT HEAD FINDINGS  No mass. No hydrocephalus. No hemorrhage. No acute bony abnormality .  CT CERVICAL SPINE FINDINGS  Carotid atherosclerotic vascular calcification. Nonspecific heterogeneous thyroid gland. Soft tissue structures are otherwise unremarkable. Diffuse degenerative changes cervical spine. Slight deformity noted of the odontoid process is unchanged. No acute bony abnormality.  IMPRESSION: 1. No acute intracranial abnormality 2. Diffuse degenerative change of the cervical spine. No acute abnormality.   Electronically Signed   By: Marcello Moores  Register   On: 03/27/2014 09:38   Ct Pelvis Wo Contrast  03/27/2014   CLINICAL DATA:  Right hip pain after fall.  EXAM: CT PELVIS WITHOUT CONTRAST  TECHNIQUE: Multidetector CT imaging of the pelvis was performed following the standard protocol without intravenous contrast.   COMPARISON:  None.  FINDINGS: Status post internal fixation of old right proximal femoral neck fracture. Old fractures of the left superior and inferior pubic rami are noted. Left hip joint appears normal. Sacroiliac joints appear normal. Nondisplaced acute fracture is seen involving the anterior and superior portion of the right sacrum. Degenerative changes are noted in lower lumbar spine stool is noted in the rectum and sigmoid colon. No abnormal fluid collection is noted in the peritoneal space.  IMPRESSION: Status post surgical internal fixation of old proximal right femoral neck fracture. Old healed fractures of the left  superior and inferior pubic rami are noted. Nondisplaced acute fracture involving anterior and superior aspect of right side of sacrum.   Electronically Signed   By: Sabino Dick M.D.   On: 03/27/2014 10:56     EKG Interpretation None      11:45 AM Discussed case with Dr. Percell Miller. Will admit patient to medicine and he will consult.   MDM   Final diagnoses:  Fracture of sacrum, closed, initial encounter  Leukocytosis    Patient presents to ED after a fall. INR is 1.86. CT head and cervical spine are unremarkable. Initial XR of right hip showed no acute fracture. Patient continues to be unable to walk. CT pelvis reveals sacrum fracture. Discussed case with Dr. Percell Miller who evaluated patient in ED. Will admit to medicine as patient is unable to ambulate.  Vital signs stable. Dr. Darl Householder evaluated patient and agrees with plan. Patient / Family / Caregiver informed of clinical course, understand medical decision-making process, and agree with plan.    Elwyn Lade, PA-C 03/27/14 [phone]

## 2014-03-27 NOTE — ED Notes (Signed)
Ortho stats were discontinued.

## 2014-03-27 NOTE — ED Notes (Signed)
Per EMS pt from home, fell about 2 hours ago, was going to restroom and lost balance and fell hitting R side of head and R buttox, abrasion/hematoma to R side of head, pt on coumadin, BP 178/88, hx of HTN has not taken meds, pain on movement, NO LOC, no neck or back pain.

## 2014-03-27 NOTE — Progress Notes (Signed)
ANTICOAGULATION CONSULT NOTE - Initial Consult  Pharmacy Consult for Warfarin Indication: atrial fibrillation  No Known Allergies  Patient Measurements: Height: 5\' 4"  (162.6 cm) Weight: 141 lb 9.6 oz (64.229 kg) IBW/kg (Calculated) : 54.7   Vital Signs: Temp: 98.2 F (36.8 C) (07/16 1308) Temp src: Oral (07/16 1308) BP: 143/83 mmHg (07/16 1308) Pulse Rate: 77 (07/16 1308)  Labs:  Recent Labs  03/27/14 0815  HGB 14.0  HCT 41.1  PLT 178  LABPROT 21.4*  INR 1.86*  CREATININE 0.52    Estimated Creatinine Clearance: 39.6 ml/min (by C-G formula based on Cr of 0.52).   Medical History: Past Medical History  Diagnosis Date  . Hypertension   . Intertrochanteric fracture of right femur 09/21/2011  . Wears glasses   . Breast cancer     Right, s/p mastectomy  . PAF (paroxysmal atrial fibrillation)     On chronic coumadin  . Cataract cortical, senile   . Osteoporosis       Assessment: 17 yoF on warfarin PTA for atrial fibrillation was admitted today s/p fall with sacral fracture.  Presented with abrasion/hematoma on right side of head as well.  CT head was normal.  Pharmacy consulted to resume warfarin.  Ortho consulted.  Home dose reported as 2.5 mg daily.  Last dose 7/15.  INR subtherapeutic on admission at 1.86.  CBC WNL  Goal of Therapy:  INR 2-3   Plan:  Give warfarin 3.75mg  po x1 dose today Follow INR, signs & symptoms of bleeding  Kizzie Furnish, PharmD Pager: [phone] 03/27/2014 5:55 PM

## 2014-03-28 DIAGNOSIS — S3210XA Unspecified fracture of sacrum, initial encounter for closed fracture: Secondary | ICD-10-CM

## 2014-03-28 DIAGNOSIS — I4891 Unspecified atrial fibrillation: Secondary | ICD-10-CM

## 2014-03-28 DIAGNOSIS — S322XXA Fracture of coccyx, initial encounter for closed fracture: Secondary | ICD-10-CM

## 2014-03-28 DIAGNOSIS — E871 Hypo-osmolality and hyponatremia: Secondary | ICD-10-CM

## 2014-03-28 LAB — BASIC METABOLIC PANEL
ANION GAP: 10 (ref 5–15)
BUN: 17 mg/dL (ref 6–23)
CHLORIDE: 96 meq/L (ref 96–112)
CO2: 23 mEq/L (ref 19–32)
Calcium: 8.6 mg/dL (ref 8.4–10.5)
Creatinine, Ser: 0.66 mg/dL (ref 0.50–1.10)
GFR calc non Af Amer: 75 mL/min — ABNORMAL LOW (ref >90)
GFR, EST AFRICAN AMERICAN: 87 mL/min — AB (ref >90)
Glucose, Bld: 111 mg/dL — ABNORMAL HIGH (ref 70–99)
POTASSIUM: 4.4 meq/L (ref 3.7–5.3)
Sodium: 129 mEq/L — ABNORMAL LOW (ref 137–147)

## 2014-03-28 LAB — CBC
HEMATOCRIT: 38.2 % (ref 36.0–46.0)
HEMOGLOBIN: 13 g/dL (ref 12.0–15.0)
MCH: 31.2 pg (ref 26.0–34.0)
MCHC: 34 g/dL (ref 30.0–36.0)
MCV: 91.6 fL (ref 78.0–100.0)
Platelets: 164 10*3/uL (ref 150–400)
RBC: 4.17 MIL/uL (ref 3.87–5.11)
RDW: 13.4 % (ref 11.5–15.5)
WBC: 12.1 10*3/uL — ABNORMAL HIGH (ref 4.0–10.5)

## 2014-03-28 LAB — PROTIME-INR
INR: 1.67 — AB (ref 0.00–1.49)
PROTHROMBIN TIME: 19.7 s — AB (ref 11.6–15.2)

## 2014-03-28 MED ORDER — WARFARIN SODIUM 2.5 MG PO TABS
3.7500 mg | ORAL_TABLET | Freq: Once | ORAL | Status: AC
  Administered 2014-03-28: 3.75 mg via ORAL
  Filled 2014-03-28: qty 1

## 2014-03-28 MED ORDER — SODIUM CHLORIDE 0.9 % IV SOLN
INTRAVENOUS | Status: DC
  Administered 2014-03-28 – 2014-03-29 (×2): via INTRAVENOUS

## 2014-03-28 NOTE — Progress Notes (Signed)
[facility] PROGRESS NOTE  Madeline Booth JOI:[id number] DOB: 12/18/1919 DOA: 03/27/2014 PCP: [name], MD  Assessment/Plan: Sacral fracture Secondary to mechanical fall. No syncopal events. Seen by orthopedic consult and recommend nonoperative management with pain control and radiating as tolerated. Patient lives alone at home. Seen by PT and recommend skilled nursing facility. Patient currently receiving Vicodin every 2 hours when necessary and morphine every 2 hours as needed. Add bowel regimen. Orthopedic consult recommends followup in the office in 2 weeks.  Nausea and vomiting Has resolved now. Possibly related to pain medications.  Hypertension Blood pressure stable. Continue metoprolol, Cozaar and Norvasc  A. fib Continue Coumadin dosing per pharmacy. INR subtherapeutic   Hypokalemia Replenished with KCl  Hyponatremia Possibly secondary to dehydration with nausea and vomiting. Will order gentle hydration and monitor in a.m.  Leukocytosis Possibly related to dehydration and stress. Improving in the a.m. labs  Diet: Regular DVT prophylaxis: On Coumadin  Code Status: Full code Family Communication: None at bedside Disposition Plan: Skilled nursing facility   Consultants:  Orthopedics (Dr. Percell Miller)  Procedures:  None  Antibiotics:  None  HPI/Subjective: Patient seen and examined this morning. Reports pain in the hip with ambulation but was able to walk short distances with assistance during physical therapy  Objective: Filed Vitals:   03/28/14 0533  BP: 123/58  Pulse: 66  Temp: 98.2 F (36.8 C)  Resp: 18    Intake/Output Summary (Last 24 hours) at 03/28/14 1236 Last data filed at 03/28/14 1035  Gross per 24 hour  Intake    820 ml  Output    250 ml  Net    570 ml   Filed Weights   03/27/14 1308  Weight: 64.229 kg (141 lb 9.6 oz)    Exam:   General:  Elderly female in no acute distress  HEENT: No pallor, moist oral  mucosa  Chest: Clear to auscultation bilaterally  CVS: Normal S1 and S2, systolic murmur 3/6  Extremities: Warm, no edema, normal ROM of the hip  CNS: Alert and oriented  Data Reviewed: Basic Metabolic Panel:  Recent Labs Lab 03/27/14 0815 03/28/14 0503  NA 135* 129*  K 3.2* 4.4  CL 96 96  CO2 25 23  GLUCOSE 122* 111*  BUN 10 17  CREATININE 0.52 0.66  CALCIUM 9.0 8.6   Liver Function Tests:  Recent Labs Lab 03/27/14 0815  AST 28  ALT 13  ALKPHOS 88  BILITOT 0.7  PROT 7.2  ALBUMIN 3.5   No results found for this basename: LIPASE, AMYLASE,  in the last 168 hours No results found for this basename: AMMONIA,  in the last 168 hours CBC:  Recent Labs Lab 03/27/14 0815 03/28/14 0503  WBC 15.3* 12.1*  NEUTROABS 13.6*  --   HGB 14.0 13.0  HCT 41.1 38.2  MCV 90.9 91.6  PLT 178 164   Cardiac Enzymes:  Recent Labs Lab 03/27/14 1901  TROPONINI <0.30   BNP (last 3 results)  Recent Labs  07/14/13 0305  PROBNP 734.3*   CBG: No results found for this basename: GLUCAP,  in the last 168 hours  Recent Results (from the past 240 hour(s))  URINE CULTURE     Status: None   Collection Time    03/27/14  9:21 AM      Result Value Ref Range Status   Specimen Description URINE, CLEAN CATCH   Final   Special Requests NONE   Final   Culture  Setup Time  Final   Value: 03/27/2014 12:19     Performed at [facility]     Final   Value: >=100,000 COLONIES/ML     Performed at [facility]   Culture     Final   Value: ESCHERICHIA COLI     Performed at [facility]   Report Status PENDING   Incomplete     Studies: Dg Hip Complete Right  03/27/2014   CLINICAL DATA:  Fall. Right hip pain. Prior ORIF of an intertrochanteric right femoral neck fracture in January, 2013.  EXAM: RIGHT HIP - COMPLETE 2+ VIEW  COMPARISON:  09/21/2011, 09/20/2011.  FINDINGS: Prior ORIF of the intertrochanteric right femoral neck fracture with  healing. Dystrophic calcification/myositis ossificans above the greater trochanter. No evidence of acute fracture or dislocation. Hip joint anatomically aligned with mild joint space narrowing, unchanged.  Included AP pelvis demonstrates well-preserved joint space in the contralateral left hip. Interval healed fracture involving the left inferior pubic ramus. Degenerative changes involving the sacroiliac joints and symphysis pubis, unchanged. Degenerative changes involving the visualized lower lumbar spine, unchanged.  IMPRESSION: 1. No acute osseous abnormality. 2. ORIF of a prior right femoral neck fracture with healing. 3. Mild osteoarthritis involving the right hip. 4. Healed left inferior pubic ramus fracture.   Electronically Signed   By: Evangeline Dakin M.D.   On: 03/27/2014 09:07   Ct Head Wo Contrast  03/27/2014   CLINICAL DATA:  Fall.  Coumadin therapy.  EXAM: CT HEAD WITHOUT CONTRAST  CT CERVICAL SPINE WITHOUT CONTRAST  TECHNIQUE: Multidetector CT imaging of the head and cervical spine was performed following the standard protocol without intravenous contrast. Multiplanar CT image reconstructions of the cervical spine were also generated.  COMPARISON:  CT 07/14/2013.  FINDINGS: CT HEAD FINDINGS  No mass. No hydrocephalus. No hemorrhage. No acute bony abnormality .  CT CERVICAL SPINE FINDINGS  Carotid atherosclerotic vascular calcification. Nonspecific heterogeneous thyroid gland. Soft tissue structures are otherwise unremarkable. Diffuse degenerative changes cervical spine. Slight deformity noted of the odontoid process is unchanged. No acute bony abnormality.  IMPRESSION: 1. No acute intracranial abnormality 2. Diffuse degenerative change of the cervical spine. No acute abnormality.   Electronically Signed   By: [place]   On: 03/27/2014 09:38   Ct Cervical Spine Wo Contrast  03/27/2014   CLINICAL DATA:  Fall.  Coumadin therapy.  EXAM: CT HEAD WITHOUT CONTRAST  CT CERVICAL SPINE WITHOUT  CONTRAST  TECHNIQUE: Multidetector CT imaging of the head and cervical spine was performed following the standard protocol without intravenous contrast. Multiplanar CT image reconstructions of the cervical spine were also generated.  COMPARISON:  CT 07/14/2013.  FINDINGS: CT HEAD FINDINGS  No mass. No hydrocephalus. No hemorrhage. No acute bony abnormality .  CT CERVICAL SPINE FINDINGS  Carotid atherosclerotic vascular calcification. Nonspecific heterogeneous thyroid gland. Soft tissue structures are otherwise unremarkable. Diffuse degenerative changes cervical spine. Slight deformity noted of the odontoid process is unchanged. No acute bony abnormality.  IMPRESSION: 1. No acute intracranial abnormality 2. Diffuse degenerative change of the cervical spine. No acute abnormality.   Electronically Signed   By: Marcello Moores  Register   On: 03/27/2014 09:38   Ct Pelvis Wo Contrast  03/27/2014   CLINICAL DATA:  Right hip pain after fall.  EXAM: CT PELVIS WITHOUT CONTRAST  TECHNIQUE: Multidetector CT imaging of the pelvis was performed following the standard protocol without intravenous contrast.  COMPARISON:  None.  FINDINGS: Status post internal fixation of  old right proximal femoral neck fracture. Old fractures of the left superior and inferior pubic rami are noted. Left hip joint appears normal. Sacroiliac joints appear normal. Nondisplaced acute fracture is seen involving the anterior and superior portion of the right sacrum. Degenerative changes are noted in lower lumbar spine stool is noted in the rectum and sigmoid colon. No abnormal fluid collection is noted in the peritoneal space.  IMPRESSION: Status post surgical internal fixation of old proximal right femoral neck fracture. Old healed fractures of the left superior and inferior pubic rami are noted. Nondisplaced acute fracture involving anterior and superior aspect of right side of sacrum.   Electronically Signed   By: Sabino Dick M.D.   On: 03/27/2014 10:56     Scheduled Meds: . amLODipine  2.5 mg Oral QPM  . calcium carbonate  1 tablet Oral Q breakfast  . [START ON 04/02/2014] cholecalciferol  10,000 Units Oral Q Wed  . losartan  50 mg Oral q morning - 10a  . metoprolol succinate  50 mg Oral q morning - 10a  . multivitamin with minerals  1 tablet Oral q morning - 10a  . senna  1 tablet Oral BID  . sodium chloride  3 mL Intravenous Q12H  . vitamin E  200 Units Oral QPM  . warfarin  3.75 mg Oral ONCE-1800  . Warfarin - Pharmacist Dosing Inpatient   Does not apply q1800   Continuous Infusions:   Principal Problem:   Fracture of sacrum Active Problems:   HTN (hypertension)   Atrial fibrillation   Pelvic fracture   Hypokalemia   Leukocytosis, unspecified   Hyponatremia   Nausea and vomiting    Time spent: 25 minutes    Darcee Dekker, [facility] Pager [phone] If 7PM-7AM, please contact night-coverage at [email or web address], password [facility] 03/28/2014, 12:36 PM  LOS: 1 day

## 2014-03-28 NOTE — Evaluation (Signed)
Occupational Therapy Evaluation Patient Details Name: Madeline Booth MRN: [id number] DOB: 11-Jan-1920 Today's Date: 03/28/2014    History of Present Illness 80 y.o. female with a PMH of atrial fibrillation on chronic coumadin, HTN, prior history of breast cancer status post right mastectomy,  who presented to the ER after suffering from a mechanical fall at home while attempting to ambulate into bathroom in the dark and sustained R nondisplaced ant sacral fx to be treated non-operatively and WBAT per ortho note.  Pt has a h/o fall in April resulting in ORIF and 3rd digit dislocation on L side   Clinical Impression   This 80 year old female was admitted for the above.  She was mod I at home and currently needs min A for adls.  She does not have a support system and is adamently refusing SNF.  She will benefit from skilled OT in acute to maximize independence and safety and will benefit from follow up OT.    Follow Up Recommendations  SNF (Pt is not agreeable to this plan; if she continues to refuse and goes home, recommend [facility]) Pt states she has been to 2 SNFs for rehab and she feels she can do as well at home.  She does not have a support system to rely upon.   Equipment Recommendations  None recommended by OT    Recommendations for Other Services       Precautions / Restrictions Precautions Precautions: Fall Restrictions Other Position/Activity Restrictions: WBAT      Mobility Bed Mobility              Transfers Overall transfer level: Needs assistance Equipment used: Rolling walker (2 wheeled) Transfers: Sit to/from Stand Sit to Stand: Min guard from chair         General transfer comment: verbal cues for hand placement, assist to rise and steady    Balance                                            ADL Overall ADL's : Needs assistance/impaired             Lower Body Bathing: Minimal assistance;Sit to/from stand       Lower Body  Dressing: Minimal assistance;Sit to/from stand   Toilet Transfer: Min guard;Ambulation             General ADL Comments: ambulated to bathroom with min guard assist.  Pt tends to not move walker forward enough to step half way in and steps up to wheels.  she feels like she has better leverage here.  She needs min A for LLE for adls.  She does have a reacher, and explained uses for adls.  Pt used to stand to don pants; educated to sit for increased safety.  Pt lives alone--min guard to retrieve items for adls then can perform UB without supervision.  Min A for LB adls.  Pt is agreeable to sponge bathing as she cannot step into tub at this time.     Vision                     Perception     Praxis      Pertinent Vitals/Pain 8/10 sacrum, primarily R side     Hand Dominance Left   Extremity/Trunk Assessment Upper Extremity Assessment Upper Extremity Assessment: Overall WFL for tasks assessed  Lower Extremity Assessment Lower Extremity Assessment: RLE deficits/detail RLE Deficits / Details: pt with difficulty performing active movement with R LE due to pain       Communication Communication Communication: No difficulties   Cognition Arousal/Alertness: Awake/alert Behavior During Therapy: WFL for tasks assessed/performed Overall Cognitive Status: Within Functional Limits for tasks assessed                     General Comments       Exercises       Shoulder Instructions      Home Living Family/patient expects to be discharged to:: Private residence Living Arrangements: Alone   Type of Home: House Home Access: Stairs to enter [employer] of Steps: 3 Entrance Stairs-Rails: Right Home Layout: One level     Bathroom Shower/Tub: Tub/shower unit Shower/tub characteristics: Curtain Biochemist, clinical: Standard     Home Equipment: Environmental consultant - 2 wheels;Cane - single point;Bedside commode;Shower seat   Additional Comments: grab bars in  bathroom      Prior Functioning/Environment Level of Independence: Independent             OT Diagnosis: Generalized weakness;Acute pain   OT Problem List: Decreased activity tolerance;Pain;Decreased knowledge of use of DME or AE   OT Treatment/Interventions: Self-care/ADL training;DME and/or AE instruction;Patient/family education    OT Goals(Current goals can be found in the care plan section) Acute Rehab OT Goals Patient Stated Goal: go home, today if possible OT Goal Formulation: With patient Time For Goal Achievement: 04/04/14 Potential to Achieve Goals: Good ADL Goals Pt Will Perform Grooming: with supervision;standing Pt Will Perform Lower Body Bathing: with supervision;with adaptive equipment;sit to/from stand Pt Will Perform Lower Body Dressing: with supervision;with adaptive equipment;sit to/from stand Pt Will Transfer to Toilet: with supervision;ambulating;bedside commode Pt Will Perform Toileting - Clothing Manipulation and hygiene: with supervision;sit to/from stand Additional ADL Goal #1: pt will gather adl items at supervision level  OT Frequency: Min 2X/week   Barriers to D/C:            Co-evaluation              End of Session    Activity Tolerance: Patient limited by pain Patient left: in chair;with call bell/phone within reach;with family/visitor present   Time: 6967-8938 OT Time Calculation (min): 24 min Charges:  OT General Charges $OT Visit: 1 Procedure OT Evaluation $Initial OT Evaluation Tier I: 1 Procedure OT Treatments $Self Care/Home Management : 8-22 mins G-Codes: OT G-codes **NOT FOR INPATIENT CLASS** Functional Assessment Tool Used: clinical observation and judgment Functional Limitation: Self care Self Care Current Status (B0175): At least 20 percent but less than 40 percent impaired, limited or restricted Self Care Goal Status (Z0258): At least 1 percent but less than 20 percent impaired, limited or restricted   [facility] 03/28/2014, 12:44 PM Lesle Chris, OTR/L [phone] 03/28/2014

## 2014-03-28 NOTE — Progress Notes (Signed)
ANTICOAGULATION CONSULT NOTE - Follow Up  Pharmacy Consult for Warfarin Indication: atrial fibrillation  No Known Allergies  Patient Measurements: Height: 5\' 4"  (162.6 cm) Weight: 141 lb 9.6 oz (64.229 kg) IBW/kg (Calculated) : 54.7   Vital Signs: Temp: 98.2 F (36.8 C) (07/17 0533) Temp src: Oral (07/17 0533) BP: 123/58 mmHg (07/17 0533) Pulse Rate: 66 (07/17 0533)  Labs:  Recent Labs  03/27/14 0815 03/27/14 1901 03/28/14 0503  HGB 14.0  --  13.0  HCT 41.1  --  38.2  PLT 178  --  164  LABPROT 21.4*  --  19.7*  INR 1.86*  --  1.67*  CREATININE 0.52  --  0.66  TROPONINI  --  <0.30  --     Estimated Creatinine Clearance: 39.6 ml/min (by C-G formula based on Cr of 0.66).   Medical History: Past Medical History  Diagnosis Date  . Hypertension   . Intertrochanteric fracture of right femur 09/21/2011  . Wears glasses   . Breast cancer     Right, s/p mastectomy  . PAF (paroxysmal atrial fibrillation)     On chronic coumadin  . Cataract cortical, senile   . Osteoporosis     Assessment: 51 yoF on warfarin PTA for atrial fibrillation was admitted today s/p fall with sacral fracture.  Presented with abrasion/hematoma on right side of head as well.  CT head was normal.  S/p orthopedic evaluation and nonoperative management planned.  Pharmacy consulted to resume warfarin.    Home dose reported as 2.5 mg daily.   INR subtherapeutic on admission and decreased slightly today.  CBC WNL  Goal of Therapy:  INR 2-3   Plan:  1.  Repeat warfarin 3.75mg  x 1 today. 2.  Daily INR.  Hershal Coria, PharmD, BCPS Pager: [phone] 03/28/2014 7:06 AM

## 2014-03-28 NOTE — Evaluation (Signed)
Physical Therapy Evaluation Patient Details Name: Madeline Booth MRN: [id number] DOB: 1920/01/28 Today's Date: 03/28/2014   History of Present Illness  80 y.o. female with a PMH of atrial fibrillation on chronic coumadin, HTN, prior history of breast cancer status post right mastectomy,  who presented to the ER after suffering from a mechanical fall at home while attempting to ambulate into bathroom in the dark and sustained R nondisplaced ant sacral fx to be treated non-operatively and WBAT per ortho note.  Clinical Impression  Pt admitted with above. Pt currently with functional limitations due to the deficits listed below (see PT Problem List).  Pt will benefit from skilled PT to increase their independence and safety with mobility to allow discharge to the venue listed below.  Pt lives alone, high fall risk, and has no one available to assist her upon d/c therefore recommend SNF however pt resistant to recommendation.      Follow Up Recommendations SNF;Supervision for mobility/OOB    Equipment Recommendations  None recommended by PT    Recommendations for Other Services       Precautions / Restrictions Precautions Precautions: Fall Restrictions Other Position/Activity Restrictions: WBAT      Mobility  Bed Mobility Overal bed mobility: Needs Assistance Bed Mobility: Supine to Sit     Supine to sit: Min assist;HOB elevated     General bed mobility comments: increased time and effort required due to pain, assist for R LE  Transfers Overall transfer level: Needs assistance Equipment used: Rolling walker (2 wheeled) Transfers: Sit to/from Stand Sit to Stand: Min assist         General transfer comment: verbal cues for hand placement, assist to rise and steady  Ambulation/Gait Ambulation/Gait assistance: Min assist;Min guard Ambulation Distance (Feet): 20 Feet Assistive device: Rolling walker (2 wheeled) Gait Pattern/deviations: Step-to pattern;Antalgic;Trunk  flexed Gait velocity: decr   General Gait Details: verbal cues for sequence, posture, RW distance, pt ambulated around room to/from bathroom, increased time  Stairs            Wheelchair Mobility    Modified Rankin (Stroke Patients Only)       Balance                                             Pertinent Vitals/Pain Reports moderate R sacral pain with movement, activity as tolerated, repositioned    Home Living Family/patient expects to be discharged to:: Private residence Living Arrangements: Alone   Type of Home: House Home Access: Stairs to enter Entrance Stairs-Rails: Right Entrance Stairs-Number of Steps: 3 Home Layout: One level Home Equipment: Walker - 2 wheels;Cane - single point;Bedside commode Additional Comments: grab bars in bathroom    Prior Function Level of Independence: Independent               Hand Dominance        Extremity/Trunk Assessment               Lower Extremity Assessment: RLE deficits/detail RLE Deficits / Details: pt with difficulty performing active movement with R LE due to pain       Communication   Communication: No difficulties  Cognition Arousal/Alertness: Awake/alert Behavior During Therapy: WFL for tasks assessed/performed Overall Cognitive Status: Within Functional Limits for tasks assessed  General Comments      Exercises        Assessment/Plan    PT Assessment Patient needs continued PT services  PT Diagnosis Difficulty walking;Acute pain   PT Problem List Decreased strength;Decreased activity tolerance;Decreased mobility;Pain;Decreased knowledge of use of DME;Decreased balance  PT Treatment Interventions DME instruction;Gait training;Balance training;Functional mobility training;Therapeutic activities;Therapeutic exercise;Patient/family education;Stair training   PT Goals (Current goals can be found in the Care Plan section) Acute Rehab PT  Goals PT Goal Formulation: With patient Time For Goal Achievement: 04/04/14 Potential to Achieve Goals: Good    Frequency Min 3X/week   Barriers to discharge        Co-evaluation               End of Session Equipment Utilized During Treatment: Gait belt Activity Tolerance: Patient limited by pain Patient left: in chair;with call bell/phone within reach;with chair alarm set      Functional Assessment Tool Used: clinical judgement Functional Limitation: Mobility: Walking and moving around Mobility: Walking and Moving Around Current Status [phone]): At least 20 percent but less than 40 percent impaired, limited or restricted Mobility: Walking and Moving Around Goal Status [phone]): At least 1 percent but less than 20 percent impaired, limited or restricted    Time: 0904-0935 PT Time Calculation (min): 31 min   Charges:   PT Evaluation $Initial PT Evaluation Tier I: 1 Procedure PT Treatments $Gait Training: 8-22 mins $Therapeutic Activity: 8-22 mins   PT G Codes:   Functional Assessment Tool Used: clinical judgement Functional Limitation: Mobility: Walking and moving around    [place] E 03/28/2014, 12:29 PM Carmelia Bake, PT, DPT 03/28/2014 Pager: [phone]

## 2014-03-28 NOTE — Progress Notes (Signed)
Utilization review completed.  

## 2014-03-29 DIAGNOSIS — E876 Hypokalemia: Secondary | ICD-10-CM

## 2014-03-29 LAB — BASIC METABOLIC PANEL
ANION GAP: 10 (ref 5–15)
BUN: 12 mg/dL (ref 6–23)
CALCIUM: 7.9 mg/dL — AB (ref 8.4–10.5)
CO2: 24 meq/L (ref 19–32)
CREATININE: 0.53 mg/dL (ref 0.50–1.10)
Chloride: 99 mEq/L (ref 96–112)
GFR, EST NON AFRICAN AMERICAN: 81 mL/min — AB (ref >90)
Glucose, Bld: 103 mg/dL — ABNORMAL HIGH (ref 70–99)
Potassium: 3.9 mEq/L (ref 3.7–5.3)
SODIUM: 133 meq/L — AB (ref 137–147)

## 2014-03-29 LAB — PROTIME-INR
INR: 1.92 — ABNORMAL HIGH (ref 0.00–1.49)
PROTHROMBIN TIME: 22 s — AB (ref 11.6–15.2)

## 2014-03-29 MED ORDER — WARFARIN SODIUM 2.5 MG PO TABS
2.5000 mg | ORAL_TABLET | Freq: Once | ORAL | Status: DC
  Filled 2014-03-29: qty 1

## 2014-03-29 MED ORDER — HYDROCODONE-ACETAMINOPHEN 5-325 MG PO TABS: 1.0000 | ORAL_TABLET | ORAL | Status: DC | PRN

## 2014-03-29 MED ORDER — SENNA 8.6 MG PO TABS: 1.0000 | ORAL_TABLET | Freq: Two times a day (BID) | ORAL | Status: DC

## 2014-03-29 NOTE — Progress Notes (Signed)
Occupational Therapy Treatment Patient Details Name: Madeline Booth MRN: [id number] DOB: Jan 20, 1920 Today's Date: 03/29/2014    History of present illness 80 y.o. female with a PMH of atrial fibrillation on chronic coumadin, HTN, prior history of breast cancer status post right mastectomy,  who presented to the ER after suffering from a mechanical fall at home while attempting to ambulate into bathroom in the dark and sustained R nondisplaced ant sacral fx to be treated non-operatively and WBAT per ortho note.   OT comments  Pt now states she is agreeable to SNF--but only one of her choosing  Follow Up Recommendations  SNF    Equipment Recommendations  None recommended by OT    Recommendations for Other Services      Precautions / Restrictions Precautions Precautions: Fall Restrictions Other Position/Activity Restrictions: WBAT       Mobility Bed Mobility         Supine to sit: Min assist;HOB elevated     General bed mobility comments: attempted from flat bed but pt could not tolerate moving legs over edge of bed without a lot of pain. Used HOB to help her raise up  Transfers   Equipment used: Rolling walker (2 wheeled) Transfers: Sit to/from Stand Sit to Stand: Min assist         General transfer comment: verbal cues for hand placement, assist to rise and steady    Balance                                   ADL                           Toilet Transfer: Min guard;Ambulation;BSC;Minimal assistance   Toileting- Clothing Manipulation and Hygiene: Sitting/lateral lean;Supervision/safety         General ADL Comments: pt ambulated to bathroom to use commode. She needs min A for sit to stand and min guard for steps:  pt had some difficulty advancing feet at times and needed additional time.  Pt states she is considering SNF but one of her choice.         Vision                     Perception     Praxis      Cognition    Behavior During Therapy: [facility] for tasks assessed/performed Overall Cognitive Status: Within Functional Limits for tasks assessed                       Extremity/Trunk Assessment               Exercises     Shoulder Instructions       General Comments      Pertinent Vitals/ Pain       Pt has increased pain with weight bearing:  Tolerated walking to bathroom then back to chair.  Less pain when sitting--not rated and did not want to request pain medication  Home Living                                          Prior Functioning/Environment              Frequency Min 2X/week     Progress Toward Goals  OT Goals(current goals can now be found in the care plan section)  Progress towards OT goals: Progressing toward goals     Plan      Co-evaluation                 End of Session     Activity Tolerance Patient limited by pain   Patient Left in chair;with call bell/phone within reach;with chair alarm set    Nurse Communication          Time: 0109-3235 OT Time Calculation (min): 29 min  Charges: OT General Charges $OT Visit: 1 Procedure OT Treatments $Self Care/Home Management : 23-37 mins  Kerrin Mo, OTR/L [phone] 03/29/2014 03/29/2014, 12:28 PM

## 2014-03-29 NOTE — Discharge Summary (Signed)
Physician Discharge Summary  Madeline Booth KDX:[id number] DOB: [birth year] DOA: 03/27/2014  PCP: [name], MD  Admit date: 03/27/2014 Discharge date: 03/29/2014  Time spent: 25 minutes  Recommendations for Outpatient Follow-up:  D/C to SNF. Follow up with Dr Percell Miller in 2 weeks  Discharge Diagnoses:  Principal Problem:   Fracture of sacrum  Active Problems:   HTN (hypertension)   Atrial fibrillation   Hypokalemia   Leukocytosis, unspecified   Hyponatremia   Nausea and vomiting   Discharge Condition: fair  Diet recommendation: regular  Filed Weights   03/27/14 1308  Weight: 64.229 kg (141 lb 9.6 oz)    History of present illness:  80 y.o. female with a PMH of atrial fibrillation on chronic coumadin, HTN, prior history of breast cancer status post right mastectomy, who presented to the ER after suffering from a mechanical fall at home. She reports that she was walking to the bathroom with the lights off and tripped over the door frame and hit the right side of her head and fell on her sacrum. There was no loss of consciousness. She complains of a sharp pain in her sacral area when she attempts to move, but is able to ambulate with a walker slowly and with assistance. She has had some nausea and vomiting, and vomited after ambulating to the bathroom.    Hospital Course:  Sacral fracture  Secondary to mechanical fall. No syncopal events. Seen by orthopedic consult and recommend nonoperative management with pain control and radiating as tolerated. Patient lives alone at home. Seen by PT and recommend skilled nursing facility. Patient currently receiving Vicodin every 4-6 hrs and tolerating well. Added senna for  bowel regimen. Orthopedic consult recommends followup in the office in 2 weeks.   Nausea and vomiting  Has resolved now. Possibly related to pain medications.   Hypertension  Blood pressure stable. Continue metoprolol, Cozaar and Norvasc   A. fib   Continue Coumadin dosing per pharmacy. INR subtherapeutic at 1.92. Follow up at SNF.  Hypokalemia  Replenished with KCl   Hyponatremia  Possibly secondary to dehydration with nausea and vomiting. Improved with gentle hydration   Leukocytosis  Possibly related to dehydration and stress. Improving   Diet: Regular   DVT prophylaxis: On Coumadin   Code Status: Full code   Family Communication: None at bedside   Disposition Plan: Skilled nursing facility   Consultants:  Orthopedics (Dr. Percell Miller)   Procedures:  None   Antibiotics:  None   Discharge Exam: Filed Vitals:   03/29/14 0621  BP: 155/68  Pulse: 70  Temp: 97.8 F (36.6 C)  Resp: 18     General: Elderly female in no acute distress  HEENT: No pallor, moist oral mucosa  Chest: Clear to auscultation bilaterally  CVS: Normal S1 and S2, systolic murmur 3/6  Extremities: Warm, no edema, normal ROM of the hip  CNS: Alert and oriented   Discharge Instructions You were cared for by a hospitalist during your hospital stay. If you have any questions about your discharge medications or the care you received while you were in the hospital after you are discharged, you can call the unit and asked to speak with the hospitalist on call if the hospitalist that took care of you is not available. Once you are discharged, your primary care physician will handle any further medical issues. Please note that NO REFILLS for any discharge medications will be authorized once you are discharged, as it is imperative that you return to your  primary care physician (or establish a relationship with a primary care physician if you do not have one) for your aftercare needs so that they can reassess your need for medications and monitor your lab values.     Medication List         alendronate 70 MG tablet  Commonly known as:  FOSAMAX  Take 70 mg by mouth every Sunday. Take with a full glass of water on an empty stomach.     amLODipine  5 MG tablet  Commonly known as:  NORVASC  Take 2.5 mg by mouth every evening.     CALTRATE 600 PLUS-VIT D PO  Take 1 tablet by mouth 2 (two) times daily.     CENTRUM SILVER tablet  Take 1 tablet by mouth every morning.     HYDROcodone-acetaminophen 5-325 MG per tablet  Commonly known as:  NORCO/VICODIN  Take 1 tablet by mouth every 4 (four) hours as needed for moderate pain.     losartan 50 MG tablet  Commonly known as:  COZAAR  Take 50 mg by mouth every morning.     MAXIMUM D3 10000 UNITS Caps  Generic drug:  Cholecalciferol  Take 10,000 Units by mouth every Wednesday.     metoprolol succinate 50 MG 24 hr tablet  Commonly known as:  TOPROL-XL  Take 50 mg by mouth every morning. Take with or immediately following a meal.     senna 8.6 MG Tabs tablet  Commonly known as:  SENOKOT  Take 1 tablet (8.6 mg total) by mouth 2 (two) times daily.     vitamin E 200 UNIT capsule  Take 200 Units by mouth every evening.     warfarin 2.5 MG tablet  Commonly known as:  COUMADIN  Take 2.5 mg by mouth every morning.       No Known Allergies     Follow-up Information   Follow up with MURPHY, TIMOTHY, D, MD In 2 weeks.   Specialty:  Orthopedic Surgery   Contact information:   [address] [place] [address] [phone]       Follow up with ALTHEIMER,MICHAEL D, MD In 1 week. (after discharge from SNF)    Specialty:  Endocrinology   Contact information:   [address] [address] [place] [address] [phone]        The results of significant diagnostics from this hospitalization (including imaging, microbiology, ancillary and laboratory) are listed below for reference.    Significant Diagnostic Studies: Dg Hip Complete Right  03/27/2014   CLINICAL DATA:  Fall. Right hip pain. Prior ORIF of an intertrochanteric right femoral neck fracture in January, 2013.  EXAM: RIGHT HIP - COMPLETE 2+ VIEW  COMPARISON:  09/21/2011, 09/20/2011.  FINDINGS: Prior  ORIF of the intertrochanteric right femoral neck fracture with healing. Dystrophic calcification/myositis ossificans above the greater trochanter. No evidence of acute fracture or dislocation. Hip joint anatomically aligned with mild joint space narrowing, unchanged.  Included AP pelvis demonstrates well-preserved joint space in the contralateral left hip. Interval healed fracture involving the left inferior pubic ramus. Degenerative changes involving the sacroiliac joints and symphysis pubis, unchanged. Degenerative changes involving the visualized lower lumbar spine, unchanged.  IMPRESSION: 1. No acute osseous abnormality. 2. ORIF of a prior right femoral neck fracture with healing. 3. Mild osteoarthritis involving the right hip. 4. Healed left inferior pubic ramus fracture.   Electronically Signed   By: Evangeline Dakin M.D.   On: 03/27/2014 09:07   Ct Head  Wo Contrast  03/27/2014   CLINICAL DATA:  Fall.  Coumadin therapy.  EXAM: CT HEAD WITHOUT CONTRAST  CT CERVICAL SPINE WITHOUT CONTRAST  TECHNIQUE: Multidetector CT imaging of the head and cervical spine was performed following the standard protocol without intravenous contrast. Multiplanar CT image reconstructions of the cervical spine were also generated.  COMPARISON:  CT 07/14/2013.  FINDINGS: CT HEAD FINDINGS  No mass. No hydrocephalus. No hemorrhage. No acute bony abnormality .  CT CERVICAL SPINE FINDINGS  Carotid atherosclerotic vascular calcification. Nonspecific heterogeneous thyroid gland. Soft tissue structures are otherwise unremarkable. Diffuse degenerative changes cervical spine. Slight deformity noted of the odontoid process is unchanged. No acute bony abnormality.  IMPRESSION: 1. No acute intracranial abnormality 2. Diffuse degenerative change of the cervical spine. No acute abnormality.   Electronically Signed   By: [place]   On: 03/27/2014 09:38   Ct Cervical Spine Wo Contrast  03/27/2014   CLINICAL DATA:  Fall.  Coumadin  therapy.  EXAM: CT HEAD WITHOUT CONTRAST  CT CERVICAL SPINE WITHOUT CONTRAST  TECHNIQUE: Multidetector CT imaging of the head and cervical spine was performed following the standard protocol without intravenous contrast. Multiplanar CT image reconstructions of the cervical spine were also generated.  COMPARISON:  CT 07/14/2013.  FINDINGS: CT HEAD FINDINGS  No mass. No hydrocephalus. No hemorrhage. No acute bony abnormality .  CT CERVICAL SPINE FINDINGS  Carotid atherosclerotic vascular calcification. Nonspecific heterogeneous thyroid gland. Soft tissue structures are otherwise unremarkable. Diffuse degenerative changes cervical spine. Slight deformity noted of the odontoid process is unchanged. No acute bony abnormality.  IMPRESSION: 1. No acute intracranial abnormality 2. Diffuse degenerative change of the cervical spine. No acute abnormality.   Electronically Signed   By: Marcello Moores  Register   On: 03/27/2014 09:38   Ct Pelvis Wo Contrast  03/27/2014   CLINICAL DATA:  Right hip pain after fall.  EXAM: CT PELVIS WITHOUT CONTRAST  TECHNIQUE: Multidetector CT imaging of the pelvis was performed following the standard protocol without intravenous contrast.  COMPARISON:  None.  FINDINGS: Status post internal fixation of old right proximal femoral neck fracture. Old fractures of the left superior and inferior pubic rami are noted. Left hip joint appears normal. Sacroiliac joints appear normal. Nondisplaced acute fracture is seen involving the anterior and superior portion of the right sacrum. Degenerative changes are noted in lower lumbar spine stool is noted in the rectum and sigmoid colon. No abnormal fluid collection is noted in the peritoneal space.  IMPRESSION: Status post surgical internal fixation of old proximal right femoral neck fracture. Old healed fractures of the left superior and inferior pubic rami are noted. Nondisplaced acute fracture involving anterior and superior aspect of right side of sacrum.    Electronically Signed   By: Sabino Dick M.D.   On: 03/27/2014 10:56    Microbiology: Recent Results (from the past 240 hour(s))  URINE CULTURE     Status: None   Collection Time    03/27/14  9:21 AM      Result Value Ref Range Status   Specimen Description URINE, CLEAN CATCH   Final   Special Requests NONE   Final   Culture  Setup Time     Final   Value: 03/27/2014 12:19     Performed at [facility]     Final   Value: >=100,000 COLONIES/ML     Performed at [facility]   Culture     Final   Value:  ESCHERICHIA COLI     Performed at [facility]   Report Status PENDING   Incomplete     Labs: Basic Metabolic Panel:  Recent Labs Lab 03/27/14 0815 03/28/14 0503 03/29/14 0443  NA 135* 129* 133*  K 3.2* 4.4 3.9  CL 96 96 99  CO2 25 23 24   GLUCOSE 122* 111* 103*  BUN 10 17 12   CREATININE 0.52 0.66 0.53  CALCIUM 9.0 8.6 7.9*   Liver Function Tests:  Recent Labs Lab 03/27/14 0815  AST 28  ALT 13  ALKPHOS 88  BILITOT 0.7  PROT 7.2  ALBUMIN 3.5   No results found for this basename: LIPASE, AMYLASE,  in the last 168 hours No results found for this basename: AMMONIA,  in the last 168 hours CBC:  Recent Labs Lab 03/27/14 0815 03/28/14 0503  WBC 15.3* 12.1*  NEUTROABS 13.6*  --   HGB 14.0 13.0  HCT 41.1 38.2  MCV 90.9 91.6  PLT 178 164   Cardiac Enzymes:  Recent Labs Lab 03/27/14 1901  TROPONINI <0.30   BNP: BNP (last 3 results)  Recent Labs  07/14/13 0305  PROBNP 734.3*   CBG: No results found for this basename: GLUCAP,  in the last 168 hours     Signed:  Maryse Brierley  [facility] 03/29/2014, 2:01 PM

## 2014-03-29 NOTE — Progress Notes (Signed)
Pt left at this time with PT[facility] in route to [facility]. Pt alert and oriented. No c/o.

## 2014-03-29 NOTE — Progress Notes (Signed)
CSW consult to pt regarding possible SNF placement vs staying at hospital. CSW and CM, Edwin Cap spoke with pt regarding information received by attending physician that pt is ready for discharge. CSW spoke with pt regarding choice and pt initially wanted to go to [facility]. CSW called [facility] and was told that there are no admissions received on the weekend. Pt was given the option of 2 other places and [facility] made a bed offer. [facility] and Discharge Summary sent. PTAR arranged for a 4:00 pm pick up. Pt./family, Attending RN and [facility] made aware. CSW prepared packet for PTAR. Pt appreciative of assistance. No further CSW intervention needed at this time.   [address] [place]

## 2014-03-29 NOTE — Progress Notes (Signed)
Writer gave report to nurse at [facility] at this time. Pt is to be transferred via [facility] this afternoon for admission to [facility]. Pt alert and oriented to transfer.

## 2014-03-29 NOTE — Progress Notes (Signed)
Per SW, pt has been accepted at [facility], but pt refuses to go there. Pt requesting [facility]. SW will look into availability.

## 2014-03-29 NOTE — Progress Notes (Addendum)
ANTICOAGULATION CONSULT NOTE - Follow Up  Pharmacy Consult for Warfarin Indication: atrial fibrillation  No Known Allergies  Patient Measurements: Height: 5\' 4"  (162.6 cm) Weight: 141 lb 9.6 oz (64.229 kg) IBW/kg (Calculated) : 54.7   Vital Signs: Temp: 97.8 F (36.6 C) (07/18 0621) Temp src: Oral (07/18 0621) BP: 155/68 mmHg (07/18 0621) Pulse Rate: 70 (07/18 0621)  Labs:  Recent Labs  03/27/14 0815 03/27/14 1901 03/28/14 0503 03/29/14 0443  HGB 14.0  --  13.0  --   HCT 41.1  --  38.2  --   PLT 178  --  164  --   LABPROT 21.4*  --  19.7* 22.0*  INR 1.86*  --  1.67* 1.92*  CREATININE 0.52  --  0.66 0.53  TROPONINI  --  <0.30  --   --     Estimated Creatinine Clearance: 39.6 ml/min (by C-G formula based on Cr of 0.53).   Medical History: Past Medical History  Diagnosis Date  . Hypertension   . Intertrochanteric fracture of right femur 09/21/2011  . Wears glasses   . Breast cancer     Right, s/p mastectomy  . PAF (paroxysmal atrial fibrillation)     On chronic coumadin  . Cataract cortical, senile   . Osteoporosis     Assessment: 32 yoF on warfarin PTA for atrial fibrillation was admitted today s/p fall with sacral fracture.  Presented with abrasion/hematoma on right side of head as well.  CT head was normal.  S/p orthopedic evaluation and nonoperative management planned.  Pharmacy consulted to resume warfarin.    Home dose reported as 2.5 mg daily.   INR subtherapeutic on admission at 1.86  Today's INR is 1.92, trending toward therapeutic with boosted doses of warfarin 3.75mg  x 2 doses  CBC WNL  Goal of Therapy:  INR 2-3   Plan:  1.  Give warfarin 2.5mg  x 1 today. 2.  Daily INR.  Doreene Eland, PharmD, BCPS.   Pager: [phone] 03/29/2014 11:22 AM

## 2014-03-30 LAB — URINE CULTURE: Colony Count: >=100000

## 2014-09-15 ENCOUNTER — Encounter (HOSPITAL_COMMUNITY): Payer: Self-pay | Admitting: *Deleted

## 2014-09-15 ENCOUNTER — Emergency Department (HOSPITAL_COMMUNITY): Payer: Medicare PPO

## 2014-09-15 ENCOUNTER — Observation Stay (HOSPITAL_COMMUNITY)
Admission: EM | Admit: 2014-09-15 | Discharge: 2014-09-17 | Disposition: A | Discharge status: Home / Self Care | Payer: Medicare PPO | Attending: Internal Medicine | Admitting: Internal Medicine

## 2014-09-15 DIAGNOSIS — R0789 Other chest pain: Principal | ICD-10-CM | POA: Insufficient documentation

## 2014-09-15 DIAGNOSIS — Z853 Personal history of malignant neoplasm of breast: Secondary | ICD-10-CM | POA: Insufficient documentation

## 2014-09-15 DIAGNOSIS — I4891 Unspecified atrial fibrillation: Secondary | ICD-10-CM | POA: Diagnosis not present

## 2014-09-15 DIAGNOSIS — R0781 Pleurodynia: Secondary | ICD-10-CM

## 2014-09-15 DIAGNOSIS — Z87891 Personal history of nicotine dependence: Secondary | ICD-10-CM | POA: Insufficient documentation

## 2014-09-15 DIAGNOSIS — Z87828 Personal history of other (healed) physical injury and trauma: Secondary | ICD-10-CM | POA: Insufficient documentation

## 2014-09-15 DIAGNOSIS — I480 Paroxysmal atrial fibrillation: Secondary | ICD-10-CM | POA: Insufficient documentation

## 2014-09-15 DIAGNOSIS — Z7901 Long term (current) use of anticoagulants: Secondary | ICD-10-CM | POA: Insufficient documentation

## 2014-09-15 DIAGNOSIS — Z79899 Other long term (current) drug therapy: Secondary | ICD-10-CM | POA: Insufficient documentation

## 2014-09-15 DIAGNOSIS — R079 Chest pain, unspecified: Secondary | ICD-10-CM | POA: Diagnosis present

## 2014-09-15 DIAGNOSIS — Z973 Presence of spectacles and contact lenses: Secondary | ICD-10-CM | POA: Diagnosis not present

## 2014-09-15 DIAGNOSIS — I10 Essential (primary) hypertension: Secondary | ICD-10-CM | POA: Diagnosis not present

## 2014-09-15 DIAGNOSIS — M810 Age-related osteoporosis without current pathological fracture: Secondary | ICD-10-CM | POA: Diagnosis not present

## 2014-09-15 DIAGNOSIS — D72829 Elevated white blood cell count, unspecified: Secondary | ICD-10-CM | POA: Diagnosis present

## 2014-09-15 LAB — COMPREHENSIVE METABOLIC PANEL
ALK PHOS: 99 U/L (ref 39–117)
ALT: 14 U/L (ref 0–35)
AST: 33 U/L (ref 0–37)
Albumin: 2.8 g/dL — ABNORMAL LOW (ref 3.5–5.2)
Anion gap: 11 (ref 5–15)
BUN: 12 mg/dL (ref 6–23)
CHLORIDE: 100 meq/L (ref 96–112)
CO2: 24 mmol/L (ref 19–32)
Calcium: 8.4 mg/dL (ref 8.4–10.5)
Creatinine, Ser: 0.63 mg/dL (ref 0.50–1.10)
GFR calc Af Amer: 88 mL/min — ABNORMAL LOW (ref >90)
GFR, EST NON AFRICAN AMERICAN: 76 mL/min — AB (ref >90)
Glucose, Bld: 140 mg/dL — ABNORMAL HIGH (ref 70–99)
POTASSIUM: 3.7 mmol/L (ref 3.5–5.1)
Sodium: 135 mmol/L (ref 135–145)
Total Bilirubin: 0.8 mg/dL (ref 0.3–1.2)
Total Protein: 6.6 g/dL (ref 6.0–8.3)

## 2014-09-15 LAB — CBC WITH DIFFERENTIAL/PLATELET
Basophils Absolute: 0 10*3/uL (ref 0.0–0.1)
Basophils Relative: 0 % (ref 0–1)
EOS PCT: 0 % (ref 0–5)
Eosinophils Absolute: 0 10*3/uL (ref 0.0–0.7)
HEMATOCRIT: 34.6 % — AB (ref 36.0–46.0)
Hemoglobin: 11.4 g/dL — ABNORMAL LOW (ref 12.0–15.0)
Lymphocytes Relative: 3 % — ABNORMAL LOW (ref 12–46)
Lymphs Abs: 0.5 10*3/uL — ABNORMAL LOW (ref 0.7–4.0)
MCH: 30.2 pg (ref 26.0–34.0)
MCHC: 32.9 g/dL (ref 30.0–36.0)
MCV: 91.5 fL (ref 78.0–100.0)
MONO ABS: 1.1 10*3/uL — AB (ref 0.1–1.0)
MONOS PCT: 7 % (ref 3–12)
NEUTROS ABS: 14 10*3/uL — AB (ref 1.7–7.7)
Neutrophils Relative %: 90 % — ABNORMAL HIGH (ref 43–77)
Platelets: 227 10*3/uL (ref 150–400)
RBC: 3.78 MIL/uL — ABNORMAL LOW (ref 3.87–5.11)
RDW: 12.4 % (ref 11.5–15.5)
WBC: 15.6 10*3/uL — ABNORMAL HIGH (ref 4.0–10.5)

## 2014-09-15 LAB — TROPONIN I
TROPONIN I: 0.04 ng/mL — AB (ref <0.031)
TROPONIN I: 0.07 ng/mL — AB (ref <0.031)
Troponin I: 0.03 ng/mL (ref <0.031)

## 2014-09-15 LAB — I-STAT TROPONIN, ED: TROPONIN I, POC: 0.02 ng/mL (ref 0.00–0.08)

## 2014-09-15 LAB — PROTIME-INR
INR: 2.8 — ABNORMAL HIGH (ref 0.00–1.49)
Prothrombin Time: 29.8 seconds — ABNORMAL HIGH (ref 11.6–15.2)

## 2014-09-15 MED ORDER — CHOLECALCIFEROL 250 MCG (10000 UT) PO CAPS
10000.0000 [IU] | ORAL_CAPSULE | ORAL | Status: DC
  Filled 2014-09-15 (×2): qty 1

## 2014-09-15 MED ORDER — METOPROLOL SUCCINATE ER 50 MG PO TB24
50.0000 mg | ORAL_TABLET | Freq: Every day | ORAL | Status: DC
  Administered 2014-09-15 – 2014-09-17 (×3): 50 mg via ORAL
  Filled 2014-09-15 (×3): qty 1

## 2014-09-15 MED ORDER — GI COCKTAIL ~~LOC~~: 30.0000 mL | Freq: Four times a day (QID) | ORAL | Status: DC | PRN

## 2014-09-15 MED ORDER — ACETAMINOPHEN 325 MG PO TABS: 650.0000 mg | ORAL_TABLET | ORAL | Status: DC | PRN

## 2014-09-15 MED ORDER — MORPHINE SULFATE 2 MG/ML IJ SOLN: 2.0000 mg | Freq: Once | INTRAMUSCULAR | Status: DC

## 2014-09-15 MED ORDER — VITAMIN E 45 MG (100 UNIT) PO CAPS
200.0000 [IU] | ORAL_CAPSULE | Freq: Every evening | ORAL | Status: DC
  Administered 2014-09-15 – 2014-09-16 (×2): 200 [IU] via ORAL
  Filled 2014-09-15 (×3): qty 2

## 2014-09-15 MED ORDER — OXYCODONE HCL 5 MG PO TABS: 5.0000 mg | ORAL_TABLET | Freq: Four times a day (QID) | ORAL | Status: DC | PRN

## 2014-09-15 MED ORDER — CENTRUM SILVER PO TABS: 1.0000 | ORAL_TABLET | Freq: Every morning | ORAL | Status: DC

## 2014-09-15 MED ORDER — ONDANSETRON HCL 4 MG/2ML IJ SOLN: 4.0000 mg | Freq: Four times a day (QID) | INTRAMUSCULAR | Status: DC | PRN

## 2014-09-15 MED ORDER — WARFARIN SODIUM 2.5 MG PO TABS
2.5000 mg | ORAL_TABLET | Freq: Every day | ORAL | Status: DC
  Administered 2014-09-15 – 2014-09-16 (×2): 2.5 mg via ORAL
  Filled 2014-09-15 (×2): qty 1

## 2014-09-15 MED ORDER — LOSARTAN POTASSIUM 50 MG PO TABS
50.0000 mg | ORAL_TABLET | Freq: Every day | ORAL | Status: DC
  Administered 2014-09-15 – 2014-09-17 (×3): 50 mg via ORAL
  Filled 2014-09-15 (×3): qty 1

## 2014-09-15 MED ORDER — SODIUM CHLORIDE 0.9 % IV SOLN
INTRAVENOUS | Status: DC
  Administered 2014-09-15: 10 mL/h via INTRAVENOUS

## 2014-09-15 MED ORDER — WARFARIN SODIUM 2.5 MG PO TABS: 2.5000 mg | ORAL_TABLET | Freq: Every day | ORAL | Status: DC

## 2014-09-15 MED ORDER — WARFARIN - PHARMACIST DOSING INPATIENT: Freq: Every day | Status: DC

## 2014-09-15 MED ORDER — PANTOPRAZOLE SODIUM 40 MG PO TBEC
40.0000 mg | DELAYED_RELEASE_TABLET | Freq: Every day | ORAL | Status: DC
  Administered 2014-09-16 – 2014-09-17 (×2): 40 mg via ORAL
  Filled 2014-09-15 (×2): qty 1

## 2014-09-15 MED ORDER — MORPHINE SULFATE 2 MG/ML IJ SOLN: 1.0000 mg | INTRAMUSCULAR | Status: DC | PRN

## 2014-09-15 MED ORDER — AMLODIPINE BESYLATE 2.5 MG PO TABS
2.5000 mg | ORAL_TABLET | Freq: Every evening | ORAL | Status: DC
  Filled 2014-09-15 (×2): qty 1

## 2014-09-15 MED ORDER — ADULT MULTIVITAMIN W/MINERALS CH
1.0000 | ORAL_TABLET | Freq: Every day | ORAL | Status: DC
  Administered 2014-09-15 – 2014-09-17 (×3): 1 via ORAL
  Filled 2014-09-15 (×3): qty 1

## 2014-09-15 NOTE — H&P (Signed)
PATIENT DETAILS Name: Madeline Booth Age: 81 y.o. Sex: female Date of Birth: 07-Sep-1920 Admit Date: 09/15/2014 FAO:[id number] D, MD   CHIEF COMPLAINT:  Chest pain  HPI: Madeline Booth is a 81 y.o. female with a Past Medical History of atrial fibrillation on chronic Coumadin, history of breast cancer status post right mastectomy, hypertension who presents today with the above noted complaint. Per patient, for the past 1 day she was having generalized body aches, however starting 1 AM this morning she started having chest heaviness. She denies pain, and just describes the feeling as "heaviness". There is no radiation, no nausea, no vomiting or diaphoresis. EMS was subsequently called, she was given nitroglycerin in route to the hospital, unfortunately her blood pressure dropped to the mid 84X systolic, following which she was given IV fluids with normalization of the blood pressure. During my evaluation, she was having no further chest heaviness, she is now being admitted for further evaluation and treatment. No history of headache, fever, nausea, vomiting, diarrhea, abdominal pain or dysuria.  ALLERGIES:  No Known Allergies  PAST MEDICAL HISTORY: Past Medical History  Diagnosis Date  . Hypertension   . Intertrochanteric fracture of right femur 09/21/2011  . Wears glasses   . Breast cancer     Right, s/p mastectomy  . PAF (paroxysmal atrial fibrillation)     On chronic coumadin  . Cataract cortical, senile   . Osteoporosis     PAST SURGICAL HISTORY: Past Surgical History  Procedure Laterality Date  . Femur im nail  09/21/2011    Procedure: INTRAMEDULLARY (IM) NAIL FEMORAL;  Surgeon: Johnny Bridge;  Location: [facility];  Service: Orthopedics;  Laterality: Right;  . Appendectomy    . Tonsillectomy    . Dilation and curettage of uterus    . Sigmoidoscopy    . Mastectomy Right 1981  . Breast lumpectomy with axillary lymph node biopsy Left 2006    left-Dr Young-[facility]    . Open reduction internal fixation (orif) metacarpal Left 12/30/2013    Procedure: OPEN REDUCTION INTERNAL FIXATION (ORIF) METACARPAL DISLOCATION AND COLLATERAL LIGAMENT REPAIR;  Surgeon: [name], MD;  Location: [facility];  Service: Orthopedics;  Laterality: Left;  Open reduction left long finger metacarpophalangeal dislocation    MEDICATIONS AT HOME: Prior to Admission medications   Medication Sig Start Date End Date Taking? Authorizing Provider  alendronate (FOSAMAX) 70 MG tablet Take 70 mg by mouth every Sunday. Take with a full glass of water on an empty stomach.   Yes Historical Provider, MD  amLODipine (NORVASC) 5 MG tablet Take 2.5 mg by mouth every evening.    Yes Historical Provider, MD  Calcium-Vitamin D (CALTRATE 600 PLUS-VIT D PO) Take 1 tablet by mouth 2 (two) times daily.   Yes Historical Provider, MD  Cholecalciferol (MAXIMUM D3) 10000 UNITS CAPS Take 10,000 Units by mouth every Wednesday.   Yes Historical Provider, MD  losartan (COZAAR) 50 MG tablet Take 50 mg by mouth daily.    Yes Historical Provider, MD  metoprolol succinate (TOPROL-XL) 50 MG 24 hr tablet Take 50 mg by mouth every morning. Take with or immediately following a meal.   Yes Historical Provider, MD  Multiple Vitamins-Minerals (CENTRUM SILVER) tablet Take 1 tablet by mouth every morning.   Yes Historical Provider, MD  vitamin E 200 UNIT capsule Take 200 Units by mouth every evening.   Yes Historical Provider, MD  warfarin (COUMADIN) 2.5 MG tablet Take 2.5  mg by mouth every morning.    Yes Historical Provider, MD  HYDROcodone-acetaminophen (NORCO/VICODIN) 5-325 MG per tablet Take 1 tablet by mouth every 4 (four) hours as needed for moderate pain. Patient not taking: Reported on 09/15/2014 03/29/14   Nishant Dhungel, MD  senna (SENOKOT) 8.6 MG TABS tablet Take 1 tablet (8.6 mg total) by mouth 2 (two) times daily. Patient not taking: Reported on 09/15/2014 03/29/14   [name], MD    FAMILY  HISTORY: Family History  Problem Relation Age of Onset  . Cancer      Neg hx  . Aneurysm Father   . Parkinson's disease Sister     SOCIAL HISTORY:  reports that she quit smoking about 34 years ago. Her smoking use included Cigarettes. She smoked 0.00 packs per day. She has never used smokeless tobacco. She reports that she drinks alcohol. She reports that she does not use illicit drugs.  REVIEW OF SYSTEMS:  Constitutional:   No  weight loss, night sweats,  Fevers, chills, fatigue.  HEENT:    No headaches, Difficulty swallowing,Tooth/dental problems,Sore throat  Cardio-vascular: No chest pain,  Orthopnea, PND, swelling in lower extremities, anasarca, dizziness, palpitations  GI:  No heartburn, indigestion, abdominal pain, nausea, vomiting, diarrhea, change in   bowel habits, loss of appetite  Resp: No shortness of breath with exertion or at rest.  No excess mucus, no productive cough, No non-productive cough.  Skin:  no rash or lesions.  GU:  no dysuria, change in color of urine, no urgency or frequency.  No flank pain.  Musculoskeletal: No joint pain or swelling.  No decreased range of motion.  No back pain.  Psych: No change in mood or affect. No depression or anxiety.  No memory loss.   PHYSICAL EXAM: Blood pressure 115/77, pulse 86, temperature 98.4 F (36.9 C), temperature source Oral, resp. rate 22, height 5\' 4"  (1.626 m), weight 64.2 kg (141 lb 8.6 oz), SpO2 95 %.  General appearance :Awake, alert, not in any distress. Speech Clear. Not toxic Looking HEENT: Atraumatic and Normocephalic, pupils equally reactive to light and accomodation Neck: supple, no JVD. No cervical lymphadenopathy.  Chest:Good air entry bilaterally, no added sounds  CVS: S1 S2 regular. Abdomen: Bowel sounds present, Non tender and not distended with no gaurding, rigidity or rebound. Extremities: B/L Lower Ext shows no edema, both legs are warm to touch Neurology: Awake alert, and oriented  X 3, CN II-XII intact, Non focal Skin:No Rash Wounds:N/A  LABS ON ADMISSION:   Recent Labs  09/15/14 0833  NA 135  K 3.7  CL 100  CO2 24  GLUCOSE 140*  BUN 12  CREATININE 0.63  CALCIUM 8.4    Recent Labs  09/15/14 0833  AST 33  ALT 14  ALKPHOS 99  BILITOT 0.8  PROT 6.6  ALBUMIN 2.8*   No results for input(s): LIPASE, AMYLASE in the last 72 hours.  Recent Labs  09/15/14 0833  WBC 15.6*  NEUTROABS 14.0*  HGB 11.4*  HCT 34.6*  MCV 91.5  PLT 227   No results for input(s): CKTOTAL, CKMB, CKMBINDEX, TROPONINI in the last 72 hours. No results for input(s): DDIMER in the last 72 hours. Invalid input(s): POCBNP   RADIOLOGIC STUDIES ON ADMISSION: Dg Chest 2 View  09/15/2014   CLINICAL DATA:  Chest heaviness and nausea. Tachycardia and atrial fibrillation.  EXAM: CHEST - 2 VIEW  COMPARISON:  07/13/2013  FINDINGS: The heart size and mediastinal contours are within normal limits. Stable scarring  in the left mid lung. There is new subsegmental atelectasis at the left lung base. There is no evidence of pulmonary edema, focal consolidation, pneumothorax, nodule or pleural fluid. The spine shows stable osteopenia and spondylosis. Clips are present in the right axilla likely related to prior breast surgery.  IMPRESSION: Atelectasis at the left lung base.  Stable left lung scarring.   Electronically Signed   By: Aletta Edouard M.D.   On: 09/15/2014 09:00     EKG: Independently reviewed. Afib  ASSESSMENT AND PLAN: Present on Admission:  . Chest pain: With typical and atypical features. Given advanced age, probably not a great candidate for further intervention. Admit to telemetry, cycle enzymes. I wonder if she had GERD, will place on PPI. We'll continue to monitor closely.   Marland Kitchen HTN (hypertension): Continue with metoprolol, amlodipine and Cozaar   . Atrial fibrillation: Currently rate controlled with metoprolol. Coumadin per pharmacy while inpatient. INR currently  therapeutic.  Further plan will depend as patient's clinical course evolves and further radiologic and laboratory data become available. Patient will be monitored closely.  Above noted plan was discussed with patient, she was in agreement.   DVT Prophylaxis: On Coumadin  Code Status: Full Code  Disposition Plan: Home  Total time spent for admission equals 45 minutes.  [facility] Pager [phone]  If 7PM-7AM, please contact night-coverage [email or web address] Password [facility] 09/15/2014, 12:49 PM

## 2014-09-15 NOTE — ED Notes (Signed)
Patient with no noted swelling.  She denies any cardiac hx.  She states she has had hypertension.  She was seen by Dr Wynonia Lawman for same.  She has been wearing the heart monitor psot visit with Dr Wynonia Lawman

## 2014-09-15 NOTE — ED Provider Notes (Signed)
CSN: [id number]     Arrival date & time 09/15/14  0801 History   First MD Initiated Contact with Patient 09/15/14 [phone]     Chief Complaint  Patient presents with  . Chest Pain     (Consider location/radiation/quality/duration/timing/severity/associated sxs/prior Treatment) HPI Comments: Patient is a 81 year old female with history of atrial fibrillation, osteoporosis, hypertension. She presents today with complaints of chest "heaviness" that started approximately 1 AM while trying to go to sleep. It has been fairly constant since that time. She denies any shortness of breath, nausea, diaphoresis, or radiation to the arm or jaw. She denies any fevers, chills, or productive cough.  She was given nitroglycerin by EMS and route to the hospital. This caused her blood pressure to drop to the 09X systolic and provided no relief from the symptoms.  She has a history of atrial fibrillation and is followed by Dr. Wynonia Lawman from cardiology. She is currently on Coumadin. She has also been wearing a Holter monitor.  Patient is a 81 y.o. female presenting with chest pain. The history is provided by the patient.  Chest Pain Pain location:  Substernal area Pain quality comment:  Heaviness Pain radiates to:  Does not radiate Pain radiates to the back: no   Pain severity:  Mild Onset quality:  Sudden Duration:  7 hours Timing:  Constant Progression:  Partially resolved Chronicity:  New Relieved by:  Nothing Worsened by:  Nothing tried Ineffective treatments:  None tried Associated symptoms: no abdominal pain, no fatigue, no fever and no shortness of breath     Past Medical History  Diagnosis Date  . Hypertension   . Intertrochanteric fracture of right femur 09/21/2011  . Wears glasses   . Breast cancer     Right, s/p mastectomy  . PAF (paroxysmal atrial fibrillation)     On chronic coumadin  . Cataract cortical, senile   . Osteoporosis    Past Surgical History  Procedure Laterality Date  .  Femur im nail  09/21/2011    Procedure: INTRAMEDULLARY (IM) NAIL FEMORAL;  Surgeon: Johnny Bridge;  Location: [facility];  Service: Orthopedics;  Laterality: Right;  . Appendectomy    . Tonsillectomy    . Dilation and curettage of uterus    . Sigmoidoscopy    . Mastectomy Right 1981  . Breast lumpectomy with axillary lymph node biopsy Left 2006    left-Dr Young-[facility]  . Open reduction internal fixation (orif) metacarpal Left 12/30/2013    Procedure: OPEN REDUCTION INTERNAL FIXATION (ORIF) METACARPAL DISLOCATION AND COLLATERAL LIGAMENT REPAIR;  Surgeon: [name], MD;  Location: [place];  Service: Orthopedics;  Laterality: Left;  Open reduction left long finger metacarpophalangeal dislocation   Family History  Problem Relation Age of Onset  . Cancer      Neg hx  . Aneurysm Father   . Parkinson's disease Sister    History  Substance Use Topics  . Smoking status: Former Smoker    Types: Cigarettes    Quit date: 03/27/1980  . Smokeless tobacco: Never Used  . Alcohol Use: Yes     Comment: Occasional wine   OB History    No data available     Review of Systems  Constitutional: Negative for fever and fatigue.  Respiratory: Negative for shortness of breath.   Cardiovascular: Positive for chest pain.  Gastrointestinal: Negative for abdominal pain.  All other systems reviewed and are negative.     Allergies  Review of patient's allergies indicates no known  allergies.  Home Medications   Prior to Admission medications   Medication Sig Start Date End Date Taking? Authorizing Provider  alendronate (FOSAMAX) 70 MG tablet Take 70 mg by mouth every Sunday. Take with a full glass of water on an empty stomach.    Historical Provider, MD  amLODipine (NORVASC) 5 MG tablet Take 2.5 mg by mouth every evening.     Historical Provider, MD  Calcium-Vitamin D (CALTRATE 600 PLUS-VIT D PO) Take 1 tablet by mouth 2 (two) times daily.    Historical Provider, MD   Cholecalciferol (MAXIMUM D3) 10000 UNITS CAPS Take 10,000 Units by mouth every Wednesday.    Historical Provider, MD  HYDROcodone-acetaminophen (NORCO/VICODIN) 5-325 MG per tablet Take 1 tablet by mouth every 4 (four) hours as needed for moderate pain. 03/29/14   Nishant Dhungel, MD  losartan (COZAAR) 50 MG tablet Take 50 mg by mouth every morning.     Historical Provider, MD  metoprolol succinate (TOPROL-XL) 50 MG 24 hr tablet Take 50 mg by mouth every morning. Take with or immediately following a meal.    Historical Provider, MD  Multiple Vitamins-Minerals (CENTRUM SILVER) tablet Take 1 tablet by mouth every morning.    Historical Provider, MD  senna (SENOKOT) 8.6 MG TABS tablet Take 1 tablet (8.6 mg total) by mouth 2 (two) times daily. 03/29/14   Nishant Dhungel, MD  vitamin E 200 UNIT capsule Take 200 Units by mouth every evening.    Historical Provider, MD  warfarin (COUMADIN) 2.5 MG tablet Take 2.5 mg by mouth every morning.     Historical Provider, MD   BP 112/78 mmHg  Pulse 99  Temp(Src) 98.4 F (36.9 C) (Oral)  Resp 30  Wt 140 lb (63.504 kg)  SpO2 94% Physical Exam  Constitutional: She is oriented to person, place, and time. She appears well-developed and well-nourished. No distress.  Patient is a 81 year old female in no acute distress. She is awake, alert, and appropriate.  HENT:  Head: Normocephalic and atraumatic.  Neck: Normal range of motion. Neck supple.  Cardiovascular: Normal rate and regular rhythm.  Exam reveals no gallop and no friction rub.   No murmur heard. Pulmonary/Chest: Effort normal and breath sounds normal. No respiratory distress. She has no wheezes. She exhibits tenderness.  There is mild tenderness to the anterior chest wall.  Abdominal: Soft. Bowel sounds are normal. She exhibits no distension. There is no tenderness.  Musculoskeletal: Normal range of motion. She exhibits no edema.  Neurological: She is alert and oriented to person, place, and time.   Skin: Skin is warm and dry. She is not diaphoretic.  Nursing note and vitals reviewed.   ED Course  Procedures (including critical care time) Labs Review Labs Reviewed  COMPREHENSIVE METABOLIC PANEL  CBC WITH DIFFERENTIAL  I-STAT [place], ED    Imaging Review No results found.   EKG Interpretation   Date/Time:  Monday September 15 2014 08:12:41 EST Ventricular Rate:  103 PR Interval:    QRS Duration: 82 QT Interval:  353 QTC Calculation: 462 R Axis:   41 Text Interpretation:  Atrial fibrillation Atrial Fibrillation new when  compared with ecg dated 03/27/14 Confirmed by DELOS  MD, Kolson Chovanec ([id number]) on  09/15/2014 8:36:27 AM      MDM   Final diagnoses:  None    Patient is a 81 year old female who presents with complaints of chest tightness. She is found to be in atrial fibrillation which she has had before, but not on very many occasions. She  is feeling somewhat better since arriving in the ER. Her initial troponin is negative. In discussion with Dr. Wynonia Lawman, the patient will be admitted to the hospitalist service. I've spoken with Dr. Sloan Leiter who agrees to admit.    Veryl Speak, MD 09/15/14 1036

## 2014-09-15 NOTE — ED Notes (Signed)
Patient with onset of chest heaviness and nausea since last night.  Unable to rest due to discomfort.  Sx continue today and thus she called ems.  No recent illness.  Patient has been wearing a halter monitor due to having tachycardia.  Patient is afib on the monitor.  Patient is on coumadin and beta blocker.  Patient is alert and oriented.  Patient lives alone.  Patient has 20g to the right forearm.  She has received 250 cc bolus.  Patient was given nitro and bp dropped significantly, thus reason for bolus.  Last bp 104/70.  Hr 100's

## 2014-09-15 NOTE — ED Notes (Signed)
Patient does not like to take "drugs" when I advised of plan to give morphine. will reassess

## 2014-09-15 NOTE — Progress Notes (Signed)
ANTICOAGULATION CONSULT NOTE - Follow Up Consult  Pharmacy Consult for coumadin Indication: atrial fibrillation  No Known Allergies  Patient Measurements: Height: 5\' 4"  (162.6 cm) Weight: 141 lb 8.6 oz (64.2 kg) IBW/kg (Calculated) : 54.7  Vital Signs: Temp: 98.4 F (36.9 C) (01/04 0813) Temp Source: Oral (01/04 0813) BP: 115/77 mmHg (01/04 1140) Pulse Rate: 86 (01/04 1045)  Labs:  Recent Labs  09/15/14 0833  HGB 11.4*  HCT 34.6*  PLT 227  LABPROT 29.8*  INR 2.80*  CREATININE 0.63    Estimated Creatinine Clearance: 38.7 mL/min (by C-G formula based on Cr of 0.63).  Assessment: Patient is a 81 y.o F on coumadin PTA for Afib.  She presented to the ED this morning with c/o CP and was found to be in Afib. Per patient, home dose is 2.5mg  daily with last dose taken on 1/3.    INR is therapeutic at 2.80 today.  Goal of Therapy:  INR 2-3    Plan:  - resume coumadin 2.5mg  daily  Ronold Hardgrove P 09/15/2014,1:14 PM

## 2014-09-15 NOTE — Discharge Instructions (Signed)

## 2014-09-15 NOTE — ED Notes (Signed)
Patient to xray.

## 2014-09-16 ENCOUNTER — Observation Stay (HOSPITAL_COMMUNITY): Payer: Medicare PPO

## 2014-09-16 ENCOUNTER — Encounter (HOSPITAL_COMMUNITY): Payer: Self-pay | Admitting: Radiology

## 2014-09-16 DIAGNOSIS — Z7901 Long term (current) use of anticoagulants: Secondary | ICD-10-CM

## 2014-09-16 DIAGNOSIS — D72829 Elevated white blood cell count, unspecified: Secondary | ICD-10-CM

## 2014-09-16 DIAGNOSIS — R079 Chest pain, unspecified: Secondary | ICD-10-CM

## 2014-09-16 DIAGNOSIS — I4891 Unspecified atrial fibrillation: Secondary | ICD-10-CM

## 2014-09-16 LAB — URINALYSIS, ROUTINE W REFLEX MICROSCOPIC
BILIRUBIN URINE: NEGATIVE
Glucose, UA: NEGATIVE mg/dL
HGB URINE DIPSTICK: NEGATIVE
KETONES UR: NEGATIVE mg/dL
Leukocytes, UA: NEGATIVE
Nitrite: NEGATIVE
PROTEIN: NEGATIVE mg/dL
SPECIFIC GRAVITY, URINE: 1.005 (ref 1.005–1.030)
Urobilinogen, UA: 1 mg/dL (ref 0.0–1.0)
pH: 5.5 (ref 5.0–8.0)

## 2014-09-16 LAB — CBC WITH DIFFERENTIAL/PLATELET
BASOS ABS: 0 10*3/uL (ref 0.0–0.1)
BASOS PCT: 0 % (ref 0–1)
Eosinophils Absolute: 0 10*3/uL (ref 0.0–0.7)
Eosinophils Relative: 0 % (ref 0–5)
HEMATOCRIT: 35.9 % — AB (ref 36.0–46.0)
HEMOGLOBIN: 11.9 g/dL — AB (ref 12.0–15.0)
Lymphocytes Relative: 9 % — ABNORMAL LOW (ref 12–46)
Lymphs Abs: 1.2 10*3/uL (ref 0.7–4.0)
MCH: 30.4 pg (ref 26.0–34.0)
MCHC: 33.1 g/dL (ref 30.0–36.0)
MCV: 91.8 fL (ref 78.0–100.0)
MONOS PCT: 3 % (ref 3–12)
Monocytes Absolute: 0.5 10*3/uL (ref 0.1–1.0)
NEUTROS ABS: 12.2 10*3/uL — AB (ref 1.7–7.7)
NEUTROS PCT: 88 % — AB (ref 43–77)
PLATELETS: 251 10*3/uL (ref 150–400)
RBC: 3.91 MIL/uL (ref 3.87–5.11)
RDW: 13.1 % (ref 11.5–15.5)
WBC: 13.9 10*3/uL — AB (ref 4.0–10.5)

## 2014-09-16 LAB — PROTIME-INR
INR: 2.82 — AB (ref 0.00–1.49)
PROTHROMBIN TIME: 29.9 s — AB (ref 11.6–15.2)

## 2014-09-16 LAB — BRAIN NATRIURETIC PEPTIDE: B Natriuretic Peptide: 949.4 pg/mL — ABNORMAL HIGH (ref 0.0–100.0)

## 2014-09-16 MED ORDER — IOHEXOL 350 MG/ML SOLN
80.0000 mL | Freq: Once | INTRAVENOUS | Status: AC | PRN
  Administered 2014-09-16: 80 mL via INTRAVENOUS

## 2014-09-16 NOTE — Progress Notes (Signed)
ANTICOAGULATION CONSULT NOTE - Follow Up Consult  Pharmacy Consult for coumadin Indication: atrial fibrillation  No Known Allergies  Patient Measurements: Height: 5\' 4"  (162.6 cm) Weight: 142 lb 3.2 oz (64.501 kg) IBW/kg (Calculated) : 54.7  Vital Signs: Temp: 98 F (36.7 C) (01/05 0623) Temp Source: Oral (01/05 0623) BP: 109/64 mmHg (01/05 1002) Pulse Rate: 84 (01/05 1002)  Labs:  Recent Labs  09/15/14 0833 09/15/14 1512 09/15/14 1700 09/15/14 1835 09/16/14 0434  HGB 11.4*  --   --   --   --   HCT 34.6*  --   --   --   --   PLT 227  --   --   --   --   LABPROT 29.8*  --   --   --  29.9*  INR 2.80*  --   --   --  2.82*  CREATININE 0.63  --   --   --   --   TROPONINI  --  0.04* 0.03 0.07*  --     Estimated Creatinine Clearance: 38.7 mL/min (by C-G formula based on Cr of 0.63).  Assessment: Patient is a 81 y.o F on coumadin PTA for Afib.  She presented to the ED this morning with c/o CP and was found to be in Afib. Per patient, home dose is 2.5mg  daily with last dose taken on 1/3.    INR remains therapeutic at 2.82 today. No bleeding noted.  Goal of Therapy:  INR 2-3  Plan:  - Resume coumadin 2.5mg  daily - Daily INR - Monitor s/sx of bleeding  Sharilyn Sites M 09/16/2014,10:58 AM

## 2014-09-16 NOTE — Progress Notes (Signed)
Chart reviewed.   [facility] PROGRESS NOTE  Madeline Booth HWE:[id number] DOB: 31-Dec-1919 DOA: 09/15/2014 PCP: [name], MD  Assessment/Plan:  Principal Problem:   Chest pain: 2 separate:  Substernal pressure.  Also left pleuritic. trop minimally elevated. Doubt ACS. Discussed with Dr. Wynonia Lawman. No need for ischemia workup.  CTA chest without PE or infiltrate. Home in am. Pt has no ride tonight. Lives alone. Discharge in am. Active Problems:   HTN (hypertension)   Atrial fibrillation, unspecified   Leukocytosis: no infection found   Warfarin anticoagulation  HPI/Subjective: Feels better. Want to go home.  Objective: Filed Vitals:   09/16/14 1002  BP: 109/64  Pulse: 84  Temp:   Resp:     Intake/Output Summary (Last 24 hours) at 09/16/14 1038 Last data filed at 09/16/14 0438  Gross per 24 hour  Intake    240 ml  Output    600 ml  Net   -360 ml   Filed Weights   09/15/14 0813 09/15/14 1139 09/16/14 0623  Weight: 63.504 kg (140 lb) 64.2 kg (141 lb 8.6 oz) 64.501 kg (142 lb 3.2 oz)    Exam:   General:  comfortable  Cardiovascular: RRR without MGR  Respiratory: CTA without WRR. No chest wall tenderness  Abdomen: s, nt, nd  Ext: no CCE  Basic Metabolic Panel:  Recent Labs Lab 09/15/14 0833  NA 135  K 3.7  CL 100  CO2 24  GLUCOSE 140*  BUN 12  CREATININE 0.63  CALCIUM 8.4   Liver Function Tests:  Recent Labs Lab 09/15/14 0833  AST 33  ALT 14  ALKPHOS 99  BILITOT 0.8  PROT 6.6  ALBUMIN 2.8*   No results for input(s): LIPASE, AMYLASE in the last 168 hours. No results for input(s): AMMONIA in the last 168 hours. CBC:  Recent Labs Lab 09/15/14 0833  WBC 15.6*  NEUTROABS 14.0*  HGB 11.4*  HCT 34.6*  MCV 91.5  PLT 227   Cardiac Enzymes:  Recent Labs Lab 09/15/14 1512 09/15/14 1700 09/15/14 1835  TROPONINI 0.04* 0.03 0.07*   BNP (last 3 results) No results for input(s): PROBNP in the last 8760 hours. CBG: No  results for input(s): GLUCAP in the last 168 hours.  No results found for this or any previous visit (from the past 240 hour(s)).   Studies: Dg Chest 2 View  09/15/2014   CLINICAL DATA:  Chest heaviness and nausea. Tachycardia and atrial fibrillation.  EXAM: CHEST - 2 VIEW  COMPARISON:  07/13/2013  FINDINGS: The heart size and mediastinal contours are within normal limits. Stable scarring in the left mid lung. There is new subsegmental atelectasis at the left lung base. There is no evidence of pulmonary edema, focal consolidation, pneumothorax, nodule or pleural fluid. The spine shows stable osteopenia and spondylosis. Clips are present in the right axilla likely related to prior breast surgery.  IMPRESSION: Atelectasis at the left lung base.  Stable left lung scarring.   Electronically Signed   By: Aletta Edouard M.D.   On: 09/15/2014 09:00    Scheduled Meds: . amLODipine  2.5 mg Oral QPM  . [START ON 09/17/2014] Cholecalciferol  10,000 Units Oral Q Wed  . losartan  50 mg Oral Daily  . metoprolol succinate  50 mg Oral Daily  . multivitamin with minerals  1 tablet Oral Daily  . pantoprazole  40 mg Oral Q1200  . vitamin E  200 Units Oral QPM  . warfarin  2.5 mg Oral q1800  .  Warfarin - Pharmacist Dosing Inpatient   Does not apply q1800   Continuous Infusions: . sodium chloride 10 mL/hr (09/15/14 1400)    Time spent: 35 minutes  [facility] [email or web address], password [facility] 09/16/2014, 10:38 AM  LOS: 1 day

## 2014-09-16 NOTE — Progress Notes (Signed)
UR completed 

## 2014-09-17 LAB — PROTIME-INR
INR: 2.97 — AB (ref 0.00–1.49)
Prothrombin Time: 31.1 seconds — ABNORMAL HIGH (ref 11.6–15.2)

## 2014-09-17 MED ORDER — VITAMIN D 1000 UNITS PO TABS: 10000.0000 [IU] | ORAL_TABLET | ORAL | Status: DC

## 2014-09-17 MED ORDER — SENNA 8.6 MG PO TABS
1.0000 | ORAL_TABLET | Freq: Once | ORAL | Status: AC
  Administered 2014-09-17: 8.6 mg via ORAL
  Filled 2014-09-17: qty 1

## 2014-09-17 MED ORDER — ACETAMINOPHEN 325 MG PO TABS: 650.0000 mg | ORAL_TABLET | ORAL | Status: DC | PRN

## 2014-09-17 NOTE — Progress Notes (Signed)
ANTICOAGULATION CONSULT NOTE - Follow Up Consult  Pharmacy Consult for coumadin Indication: atrial fibrillation  No Known Allergies  Patient Measurements: Height: 5\' 4"  (162.6 cm) Weight: 142 lb 12.8 oz (64.774 kg) IBW/kg (Calculated) : 54.7  Vital Signs: Temp: 98 F (36.7 C) (01/06 0528) Temp Source: Oral (01/06 0528) BP: 113/70 mmHg (01/06 0528) Pulse Rate: 94 (01/06 0528)  Labs:  Recent Labs  09/15/14 0833 09/15/14 1512 09/15/14 1700 09/15/14 1835 09/16/14 0434 09/16/14 1030 09/17/14 0351  HGB 11.4*  --   --   --   --  11.9*  --   HCT 34.6*  --   --   --   --  35.9*  --   PLT 227  --   --   --   --  251  --   LABPROT 29.8*  --   --   --  29.9*  --  31.1*  INR 2.80*  --   --   --  2.82*  --  2.97*  CREATININE 0.63  --   --   --   --   --   --   TROPONINI  --  0.04* 0.03 0.07*  --   --   --     Estimated Creatinine Clearance: 38.7 mL/min (by C-G formula based on Cr of 0.63).  Assessment: Patient is a 81 y.o F on coumadin PTA for Afib.  INR remains therapeutic at 2.97 with plan for possible discharge home today. No bleeding documented.   Goal of Therapy:  INR 2-3  Plan:  - Continue coumadin 2.5mg  daily-recommend this dose for discharge - Daily INR - Monitor s/sx of bleeding - Will f/u with pt in morning if still in hospital  Lois Huxley, PharmD Candidate 09/17/2014,10:09 AM

## 2014-09-17 NOTE — Discharge Summary (Signed)
Physician Discharge Summary  CAMRIN LAPRE WNU:[id number] DOB: 1919/10/31 DOA: 09/15/2014  PCP: [name], MD  Admit date: 09/15/2014 Discharge date: 09/17/2014  Time spent:   Recommendations for Outpatient Follow-up:  1.   Discharge Diagnoses:  Principal Problem:   Chest pain Active Problems:   HTN (hypertension)   Atrial fibrillation, unspecified   Leukocytosis   Warfarin anticoagulation   Discharge Condition: stable  Filed Weights   09/15/14 1139 09/16/14 0623 09/17/14 0528  Weight: 64.2 kg (141 lb 8.6 oz) 64.501 kg (142 lb 3.2 oz) 64.774 kg (142 lb 12.8 oz)    History of present illness:  81 y.o. female with a Past Medical History of atrial fibrillation on chronic Coumadin, history of breast cancer status post right mastectomy, hypertension who presents today with the above noted complaint. Per patient, for the past 1 day she was having generalized body aches, however starting 1 AM this morning she started having chest heaviness. She denies pain, and just describes the feeling as "heaviness". There is no radiation, no nausea, no vomiting or diaphoresis. EMS was subsequently called, she was given nitroglycerin in route to the hospital, unfortunately her blood pressure dropped to the mid 03K systolic, following which she was given IV fluids with normalization of the blood pressure. During my evaluation, she was having no further chest heaviness, she is now being admitted for further evaluation and treatment. No history of headache, fever, nausea, vomiting, diarrhea, abdominal pain or dysuria.  Hospital Course:  Admitted to telemetry.  Troponin minimally elevated.   Doubt ACS. Discussed with Dr. Wynonia Lawman. No need for ischemia workup. CTA chest without PE or infiltrate.     Atrial fibrillation: remained in atrial fibrillation. Remains on warfarin.   Leukocytosis: no infection found   Procedures:  none  Consultations:  none  Discharge Exam: Filed Vitals:    09/17/14 0528  BP: 113/70  Pulse: 94  Temp: 98 F (36.7 C)  Resp: 18    General: comfortable Cardiovascular: Irreg irreg Respiratory: CTA  Discharge Instructions   Discharge Instructions    Activity as tolerated - No restrictions    Complete by:  As directed      Diet - low sodium heart healthy    Complete by:  As directed           Current Discharge Medication List    START taking these medications   Details  acetaminophen (TYLENOL) 325 MG tablet Take 2 tablets (650 mg total) by mouth every 4 (four) hours as needed for headache or mild pain.      CONTINUE these medications which have NOT CHANGED   Details  alendronate (FOSAMAX) 70 MG tablet Take 70 mg by mouth every Sunday. Take with a full glass of water on an empty stomach.    amLODipine (NORVASC) 5 MG tablet Take 2.5 mg by mouth every evening.     Calcium-Vitamin D (CALTRATE 600 PLUS-VIT D PO) Take 1 tablet by mouth 2 (two) times daily.    Cholecalciferol (MAXIMUM D3) 10000 UNITS CAPS Take 10,000 Units by mouth every Wednesday.    losartan (COZAAR) 50 MG tablet Take 50 mg by mouth daily.     metoprolol succinate (TOPROL-XL) 50 MG 24 hr tablet Take 50 mg by mouth every morning. Take with or immediately following a meal.    Multiple Vitamins-Minerals (CENTRUM SILVER) tablet Take 1 tablet by mouth every morning.    vitamin E 200 UNIT capsule Take 200 Units by mouth every evening.    warfarin (  COUMADIN) 2.5 MG tablet Take 2.5 mg by mouth every morning.     senna (SENOKOT) 8.6 MG TABS tablet Take 1 tablet (8.6 mg total) by mouth 2 (two) times daily. Qty: 10 each, Refills: 0      STOP taking these medications     HYDROcodone-acetaminophen (NORCO/VICODIN) 5-325 MG per tablet        No Known Allergies Follow-up Information    Follow up with Kerry Hough, MD.   Specialty:  Cardiology   Why:  as previously scheduled   Contact information:   [address] [place]  [id number] [phone]        The results of significant diagnostics from this hospitalization (including imaging, microbiology, ancillary and laboratory) are listed below for reference.    Significant Diagnostic Studies: Dg Chest 2 View  09/15/2014   CLINICAL DATA:  Chest heaviness and nausea. Tachycardia and atrial fibrillation.  EXAM: CHEST - 2 VIEW  COMPARISON:  07/13/2013  FINDINGS: The heart size and mediastinal contours are within normal limits. Stable scarring in the left mid lung. There is new subsegmental atelectasis at the left lung base. There is no evidence of pulmonary edema, focal consolidation, pneumothorax, nodule or pleural fluid. The spine shows stable osteopenia and spondylosis. Clips are present in the right axilla likely related to prior breast surgery.  IMPRESSION: Atelectasis at the left lung base.  Stable left lung scarring.   Electronically Signed   By: Aletta Edouard M.D.   On: 09/15/2014 09:00   Ct Angio Chest Pe W/cm &/or Wo Cm  09/16/2014   CLINICAL DATA:  Pleuritic chest pain question pulmonary embolism, history hypertension, breast cancer, former smoker  EXAM: CT ANGIOGRAPHY CHEST WITH CONTRAST  TECHNIQUE: Multidetector CT imaging of the chest was performed using the standard protocol during bolus administration of intravenous contrast. Multiplanar CT image reconstructions and MIPs were obtained to evaluate the vascular anatomy.  CONTRAST:  44mL OMNIPAQUE IOHEXOL 350 MG/ML SOLN  COMPARISON:  09/15/2014 and 07/13/2013 chest radiographs  FINDINGS: Scattered atherosclerotic calcifications aorta and coronary arteries.  Small pericardial effusion.  Aorta normal caliber without aneurysmal dilatation.  Inadequate aortic enhancement to assess for dissection.  Pulmonary arteries well opacified and patent.  No evidence of pulmonary embolism.  No thoracic adenopathy.  Azygos fissure noted.  Visualized upper abdomen unremarkable for technique.  Small BILATERAL pleural effusions and  adjacent atelectasis of the lower lobes, greater on LEFT.  Central peribronchial thickening.  No infiltrate or pneumothorax.  Diffuse osseous demineralization with RIGHT glenohumeral degenerative changes.  Old appearing manubrial fracture.  Diffuse osseous demineralization with scattered degenerative disc and facet disease changes thoracic spine.  Mild diffuse sclerosis of the T7 vertebra with mild superior endplate height loss.  Chronic compression fracture of T10 vertebra.  Review of the MIP images confirms the above findings.  IMPRESSION: No evidence of pulmonary embolism.  Extensive atherosclerotic disease.  BILATERAL pleural effusions and atelectasis greater on LEFT.  Old manubrial and T10 fractures.  Osseous demineralization.  Minimal height loss and mild sclerosis in T7 vertebra, could be related to mild compression fracture though underlying sclerotic process such as metastatic disease not entirely excluded.  If patient has prior outside exams that can help assess stability, recommend comparison.  In the absence of prior studies, consider MR follow-up.   Electronically Signed   By: Lavonia Dana M.D.   On: 09/16/2014 12:24    Microbiology: No results found for this or any previous visit (from the past  240 hour(s)).   Labs: Basic Metabolic Panel:  Recent Labs Lab 09/15/14 0833  NA 135  K 3.7  CL 100  CO2 24  GLUCOSE 140*  BUN 12  CREATININE 0.63  CALCIUM 8.4   Liver Function Tests:  Recent Labs Lab 09/15/14 0833  AST 33  ALT 14  ALKPHOS 99  BILITOT 0.8  PROT 6.6  ALBUMIN 2.8*   No results for input(s): LIPASE, AMYLASE in the last 168 hours. No results for input(s): AMMONIA in the last 168 hours. CBC:  Recent Labs Lab 09/15/14 0833 09/16/14 1030  WBC 15.6* 13.9*  NEUTROABS 14.0* 12.2*  HGB 11.4* 11.9*  HCT 34.6* 35.9*  MCV 91.5 91.8  PLT 227 251   Cardiac Enzymes:  Recent Labs Lab 09/15/14 1512 09/15/14 1700 09/15/14 1835  TROPONINI 0.04* 0.03 0.07*    BNP: BNP (last 3 results) No results for input(s): PROBNP in the last 8760 hours. CBG: No results for input(s): GLUCAP in the last 168 hours.     SignedDelfina Redwood  [facility] 09/17/2014, 10:28 AM

## 2014-10-09 ENCOUNTER — Inpatient Hospital Stay (HOSPITAL_COMMUNITY)
Admission: EM | Admit: 2014-10-09 | Discharge: 2014-10-13 | DRG: 562 | Disposition: A | Discharge status: Skilled Nursing Facility | Payer: Medicare PPO | Attending: Internal Medicine | Admitting: Internal Medicine

## 2014-10-09 DIAGNOSIS — Y92018 Other place in single-family (private) house as the place of occurrence of the external cause: Secondary | ICD-10-CM

## 2014-10-09 DIAGNOSIS — S42301A Unspecified fracture of shaft of humerus, right arm, initial encounter for closed fracture: Secondary | ICD-10-CM | POA: Diagnosis present

## 2014-10-09 DIAGNOSIS — I5031 Acute diastolic (congestive) heart failure: Secondary | ICD-10-CM | POA: Insufficient documentation

## 2014-10-09 DIAGNOSIS — I10 Essential (primary) hypertension: Secondary | ICD-10-CM | POA: Diagnosis present

## 2014-10-09 DIAGNOSIS — I4891 Unspecified atrial fibrillation: Secondary | ICD-10-CM | POA: Diagnosis present

## 2014-10-09 DIAGNOSIS — Z7901 Long term (current) use of anticoagulants: Secondary | ICD-10-CM

## 2014-10-09 DIAGNOSIS — J811 Chronic pulmonary edema: Secondary | ICD-10-CM | POA: Diagnosis present

## 2014-10-09 DIAGNOSIS — I272 Other secondary pulmonary hypertension: Secondary | ICD-10-CM | POA: Diagnosis present

## 2014-10-09 DIAGNOSIS — W010XXA Fall on same level from slipping, tripping and stumbling without subsequent striking against object, initial encounter: Secondary | ICD-10-CM | POA: Diagnosis present

## 2014-10-09 DIAGNOSIS — I5033 Acute on chronic diastolic (congestive) heart failure: Secondary | ICD-10-CM | POA: Diagnosis present

## 2014-10-09 DIAGNOSIS — W19XXXA Unspecified fall, initial encounter: Secondary | ICD-10-CM

## 2014-10-09 DIAGNOSIS — T148XXA Other injury of unspecified body region, initial encounter: Secondary | ICD-10-CM

## 2014-10-09 DIAGNOSIS — Z87891 Personal history of nicotine dependence: Secondary | ICD-10-CM

## 2014-10-09 DIAGNOSIS — I480 Paroxysmal atrial fibrillation: Secondary | ICD-10-CM | POA: Diagnosis present

## 2014-10-09 DIAGNOSIS — Z853 Personal history of malignant neoplasm of breast: Secondary | ICD-10-CM

## 2014-10-09 DIAGNOSIS — Z9011 Acquired absence of right breast and nipple: Secondary | ICD-10-CM | POA: Diagnosis present

## 2014-10-09 DIAGNOSIS — S42211A Unspecified displaced fracture of surgical neck of right humerus, initial encounter for closed fracture: Secondary | ICD-10-CM | POA: Diagnosis not present

## 2014-10-09 DIAGNOSIS — Z79899 Other long term (current) drug therapy: Secondary | ICD-10-CM

## 2014-10-09 DIAGNOSIS — M79601 Pain in right arm: Secondary | ICD-10-CM | POA: Diagnosis not present

## 2014-10-09 DIAGNOSIS — R609 Edema, unspecified: Secondary | ICD-10-CM | POA: Insufficient documentation

## 2014-10-09 DIAGNOSIS — Z820 Family history of epilepsy and other diseases of the nervous system: Secondary | ICD-10-CM

## 2014-10-09 DIAGNOSIS — R600 Localized edema: Secondary | ICD-10-CM | POA: Diagnosis present

## 2014-10-09 NOTE — ED Notes (Signed)
Patient presents via EMS from home for fall. Per EMS patient was walking up stairs outside and fell onto concrete. Small laceration above right eyebrow. Denies LOC. Coumadin therapy. C/o right upper extremity pain 5/10. No nuero deficit.   VS: 160/102, 80HR, 18Resp, 97%RA.

## 2014-10-09 NOTE — ED Notes (Signed)
Bed: FV88 Expected date:  Expected time:  Means of arrival:  Comments: EMS 81 yo female fell outside/lac over left eye-no LOC-on blood thinner/immobilized-right arm pain

## 2014-10-10 ENCOUNTER — Encounter (HOSPITAL_COMMUNITY): Payer: Self-pay | Admitting: Emergency Medicine

## 2014-10-10 ENCOUNTER — Emergency Department (HOSPITAL_COMMUNITY): Payer: Medicare PPO

## 2014-10-10 DIAGNOSIS — I482 Chronic atrial fibrillation: Secondary | ICD-10-CM | POA: Diagnosis not present

## 2014-10-10 DIAGNOSIS — S42213A Unspecified displaced fracture of surgical neck of unspecified humerus, initial encounter for closed fracture: Secondary | ICD-10-CM | POA: Insufficient documentation

## 2014-10-10 DIAGNOSIS — Z79899 Other long term (current) drug therapy: Secondary | ICD-10-CM | POA: Diagnosis not present

## 2014-10-10 DIAGNOSIS — S42309A Unspecified fracture of shaft of humerus, unspecified arm, initial encounter for closed fracture: Secondary | ICD-10-CM | POA: Insufficient documentation

## 2014-10-10 DIAGNOSIS — Z7901 Long term (current) use of anticoagulants: Secondary | ICD-10-CM | POA: Diagnosis not present

## 2014-10-10 DIAGNOSIS — I272 Other secondary pulmonary hypertension: Secondary | ICD-10-CM | POA: Diagnosis present

## 2014-10-10 DIAGNOSIS — I4891 Unspecified atrial fibrillation: Secondary | ICD-10-CM

## 2014-10-10 DIAGNOSIS — R600 Localized edema: Secondary | ICD-10-CM | POA: Diagnosis present

## 2014-10-10 DIAGNOSIS — I481 Persistent atrial fibrillation: Secondary | ICD-10-CM | POA: Diagnosis not present

## 2014-10-10 DIAGNOSIS — S42301A Unspecified fracture of shaft of humerus, right arm, initial encounter for closed fracture: Secondary | ICD-10-CM | POA: Diagnosis present

## 2014-10-10 DIAGNOSIS — M79601 Pain in right arm: Secondary | ICD-10-CM | POA: Diagnosis present

## 2014-10-10 DIAGNOSIS — S42301S Unspecified fracture of shaft of humerus, right arm, sequela: Secondary | ICD-10-CM | POA: Diagnosis not present

## 2014-10-10 DIAGNOSIS — Z853 Personal history of malignant neoplasm of breast: Secondary | ICD-10-CM | POA: Diagnosis not present

## 2014-10-10 DIAGNOSIS — I10 Essential (primary) hypertension: Secondary | ICD-10-CM

## 2014-10-10 DIAGNOSIS — W010XXA Fall on same level from slipping, tripping and stumbling without subsequent striking against object, initial encounter: Secondary | ICD-10-CM | POA: Diagnosis present

## 2014-10-10 DIAGNOSIS — Z820 Family history of epilepsy and other diseases of the nervous system: Secondary | ICD-10-CM | POA: Diagnosis not present

## 2014-10-10 DIAGNOSIS — I158 Other secondary hypertension: Secondary | ICD-10-CM | POA: Diagnosis not present

## 2014-10-10 DIAGNOSIS — J811 Chronic pulmonary edema: Secondary | ICD-10-CM | POA: Diagnosis present

## 2014-10-10 DIAGNOSIS — Y92018 Other place in single-family (private) house as the place of occurrence of the external cause: Secondary | ICD-10-CM | POA: Diagnosis not present

## 2014-10-10 DIAGNOSIS — I5033 Acute on chronic diastolic (congestive) heart failure: Secondary | ICD-10-CM | POA: Diagnosis present

## 2014-10-10 DIAGNOSIS — I480 Paroxysmal atrial fibrillation: Secondary | ICD-10-CM

## 2014-10-10 DIAGNOSIS — Z9011 Acquired absence of right breast and nipple: Secondary | ICD-10-CM | POA: Diagnosis present

## 2014-10-10 DIAGNOSIS — S42211A Unspecified displaced fracture of surgical neck of right humerus, initial encounter for closed fracture: Secondary | ICD-10-CM | POA: Diagnosis present

## 2014-10-10 DIAGNOSIS — W19XXXA Unspecified fall, initial encounter: Secondary | ICD-10-CM

## 2014-10-10 DIAGNOSIS — Z87891 Personal history of nicotine dependence: Secondary | ICD-10-CM | POA: Diagnosis not present

## 2014-10-10 LAB — TROPONIN I

## 2014-10-10 LAB — BASIC METABOLIC PANEL
ANION GAP: 12 (ref 5–15)
ANION GAP: 9 (ref 5–15)
BUN: 17 mg/dL (ref 6–23)
BUN: 16 mg/dL (ref 6–23)
CO2: 22 mmol/L (ref 19–32)
CO2: 27 mmol/L (ref 19–32)
CREATININE: 0.66 mg/dL (ref 0.50–1.10)
Calcium: 8.5 mg/dL (ref 8.4–10.5)
Calcium: 8.4 mg/dL (ref 8.4–10.5)
Chloride: 104 mmol/L (ref 96–112)
Chloride: 102 mmol/L (ref 96–112)
Creatinine, Ser: 0.67 mg/dL (ref 0.50–1.10)
GFR calc Af Amer: 86 mL/min — ABNORMAL LOW (ref >90)
GFR calc Af Amer: 86 mL/min — ABNORMAL LOW (ref >90)
GFR calc non Af Amer: 74 mL/min — ABNORMAL LOW (ref >90)
GFR calc non Af Amer: 74 mL/min — ABNORMAL LOW (ref >90)
GLUCOSE: 123 mg/dL — AB (ref 70–99)
Glucose, Bld: 112 mg/dL — ABNORMAL HIGH (ref 70–99)
POTASSIUM: 3.8 mmol/L (ref 3.5–5.1)
POTASSIUM: 4.1 mmol/L (ref 3.5–5.1)
Sodium: 138 mmol/L (ref 135–145)
Sodium: 138 mmol/L (ref 135–145)

## 2014-10-10 LAB — URINALYSIS, ROUTINE W REFLEX MICROSCOPIC
Bilirubin Urine: NEGATIVE
GLUCOSE, UA: NEGATIVE mg/dL
Hgb urine dipstick: NEGATIVE
Ketones, ur: 15 mg/dL — AB
LEUKOCYTES UA: NEGATIVE
Nitrite: POSITIVE — AB
PH: 6 (ref 5.0–8.0)
Protein, ur: NEGATIVE mg/dL
SPECIFIC GRAVITY, URINE: 1.012 (ref 1.005–1.030)
UROBILINOGEN UA: 0.2 mg/dL (ref 0.0–1.0)

## 2014-10-10 LAB — TYPE AND SCREEN
ABO/RH(D): O POS
Antibody Screen: NEGATIVE

## 2014-10-10 LAB — CBC
HEMATOCRIT: 35.4 % — AB (ref 36.0–46.0)
HEMOGLOBIN: 11.5 g/dL — AB (ref 12.0–15.0)
MCH: 30.2 pg (ref 26.0–34.0)
MCHC: 32.5 g/dL (ref 30.0–36.0)
MCV: 92.9 fL (ref 78.0–100.0)
Platelets: 173 10*3/uL (ref 150–400)
RBC: 3.81 MIL/uL — ABNORMAL LOW (ref 3.87–5.11)
RDW: 14.7 % (ref 11.5–15.5)
WBC: 9.1 10*3/uL (ref 4.0–10.5)

## 2014-10-10 LAB — CBC WITH DIFFERENTIAL/PLATELET
Basophils Absolute: 0 10*3/uL (ref 0.0–0.1)
Basophils Relative: 0 % (ref 0–1)
Eosinophils Absolute: 0 10*3/uL (ref 0.0–0.7)
Eosinophils Relative: 0 % (ref 0–5)
HEMATOCRIT: 36.9 % (ref 36.0–46.0)
HEMOGLOBIN: 11.9 g/dL — AB (ref 12.0–15.0)
LYMPHS ABS: 1 10*3/uL (ref 0.7–4.0)
Lymphocytes Relative: 10 % — ABNORMAL LOW (ref 12–46)
MCH: 30.1 pg (ref 26.0–34.0)
MCHC: 32.2 g/dL (ref 30.0–36.0)
MCV: 93.4 fL (ref 78.0–100.0)
MONO ABS: 0.6 10*3/uL (ref 0.1–1.0)
MONOS PCT: 6 % (ref 3–12)
NEUTROS ABS: 7.8 10*3/uL — AB (ref 1.7–7.7)
Neutrophils Relative %: 84 % — ABNORMAL HIGH (ref 43–77)
Platelets: 188 10*3/uL (ref 150–400)
RBC: 3.95 MIL/uL (ref 3.87–5.11)
RDW: 14.6 % (ref 11.5–15.5)
WBC: 9.4 10*3/uL (ref 4.0–10.5)

## 2014-10-10 LAB — BRAIN NATRIURETIC PEPTIDE: B Natriuretic Peptide: 647.7 pg/mL — ABNORMAL HIGH (ref 0.0–100.0)

## 2014-10-10 LAB — CK: Total CK: 60 U/L (ref 7–177)

## 2014-10-10 LAB — PROTIME-INR
INR: 1.63 — ABNORMAL HIGH (ref 0.00–1.49)
PROTHROMBIN TIME: 19.5 s — AB (ref 11.6–15.2)

## 2014-10-10 LAB — URINE MICROSCOPIC-ADD ON

## 2014-10-10 LAB — GLUCOSE, CAPILLARY: Glucose-Capillary: 95 mg/dL (ref 70–99)

## 2014-10-10 LAB — HEPARIN LEVEL (UNFRACTIONATED)
Heparin Unfractionated: 0.47 IU/mL (ref 0.30–0.70)
Heparin Unfractionated: 0.55 IU/mL (ref 0.30–0.70)

## 2014-10-10 LAB — APTT: APTT: 33 s (ref 24–37)

## 2014-10-10 MED ORDER — ADULT MULTIVITAMIN W/MINERALS CH
1.0000 | ORAL_TABLET | Freq: Every morning | ORAL | Status: DC
  Administered 2014-10-11 – 2014-10-13 (×3): 1 via ORAL
  Filled 2014-10-10 (×4): qty 1

## 2014-10-10 MED ORDER — SODIUM CHLORIDE 0.9 % IJ SOLN
3.0000 mL | Freq: Two times a day (BID) | INTRAMUSCULAR | Status: DC
  Administered 2014-10-11: 3 mL via INTRAVENOUS

## 2014-10-10 MED ORDER — SODIUM CHLORIDE 0.9 % IV SOLN: 250.0000 mL | INTRAVENOUS | Status: DC | PRN

## 2014-10-10 MED ORDER — SODIUM CHLORIDE 0.9 % IJ SOLN
3.0000 mL | Freq: Two times a day (BID) | INTRAMUSCULAR | Status: DC
  Administered 2014-10-11 – 2014-10-13 (×3): 3 mL via INTRAVENOUS

## 2014-10-10 MED ORDER — SODIUM CHLORIDE 0.9 % IJ SOLN
3.0000 mL | INTRAMUSCULAR | Status: DC | PRN
  Administered 2014-10-12: 3 mL via INTRAVENOUS
  Filled 2014-10-10: qty 3

## 2014-10-10 MED ORDER — FENTANYL CITRATE 0.05 MG/ML IJ SOLN
50.0000 ug | INTRAMUSCULAR | Status: DC | PRN
Start: 2014-10-10 — End: 2014-10-10
  Administered 2014-10-10: 50 ug via INTRAVENOUS
  Filled 2014-10-10: qty 2

## 2014-10-10 MED ORDER — METOPROLOL SUCCINATE ER 50 MG PO TB24
50.0000 mg | ORAL_TABLET | Freq: Every morning | ORAL | Status: DC
  Administered 2014-10-11 – 2014-10-13 (×3): 50 mg via ORAL
  Filled 2014-10-10 (×4): qty 1

## 2014-10-10 MED ORDER — AMLODIPINE BESYLATE 2.5 MG PO TABS
2.5000 mg | ORAL_TABLET | Freq: Every evening | ORAL | Status: DC
  Administered 2014-10-11 – 2014-10-12 (×2): 2.5 mg via ORAL
  Filled 2014-10-10 (×4): qty 1

## 2014-10-10 MED ORDER — MORPHINE SULFATE 2 MG/ML IJ SOLN: 2.0000 mg | INTRAMUSCULAR | Status: DC | PRN

## 2014-10-10 MED ORDER — ONDANSETRON HCL 4 MG/2ML IJ SOLN
4.0000 mg | Freq: Once | INTRAMUSCULAR | Status: DC
  Filled 2014-10-10: qty 2

## 2014-10-10 MED ORDER — OXYCODONE-ACETAMINOPHEN 5-325 MG PO TABS: 1.0000 | ORAL_TABLET | ORAL | Status: DC | PRN

## 2014-10-10 MED ORDER — HEPARIN (PORCINE) IN NACL 100-0.45 UNIT/ML-% IJ SOLN
900.0000 [IU]/h | INTRAMUSCULAR | Status: DC
  Administered 2014-10-10: 900 [IU]/h via INTRAVENOUS
  Filled 2014-10-10 (×3): qty 250

## 2014-10-10 MED ORDER — LOSARTAN POTASSIUM 50 MG PO TABS
50.0000 mg | ORAL_TABLET | Freq: Every day | ORAL | Status: DC
  Administered 2014-10-11 – 2014-10-13 (×3): 50 mg via ORAL
  Filled 2014-10-10 (×4): qty 1

## 2014-10-10 MED ORDER — VITAMIN E 45 MG (100 UNIT) PO CAPS
200.0000 [IU] | ORAL_CAPSULE | Freq: Every evening | ORAL | Status: DC
  Administered 2014-10-11 – 2014-10-12 (×2): 200 [IU] via ORAL
  Filled 2014-10-10 (×4): qty 2

## 2014-10-10 NOTE — Progress Notes (Signed)
CARE MANAGEMENT NOTE 10/10/2014  Patient:  Madeline Booth, Madeline Booth   Account Number:  [id number]  Date Initiated:  10/10/2014  Documentation initiated by:  DAVIS,RHONDA  Subjective/Objective Assessment:   pt with hx of mutiple falls, poss syncopy, hx of a.fib, pitting edema 3+ in lower legs     Action/Plan:   home when stable. lives alone   Anticipated DC Date:  10/13/2014   Anticipated DC Plan:  [place] referral  Clinical Social Worker      DC Planning Services  CM consult      [facility] Choice  NA   Choice offered to / List presented to:  NA   DME arranged  NA      DME agency  [place] arranged  HH-1 RN  [place].   Status of service:  In process, will continue to follow Medicare Important Message given?   (If response is "NO", the following Medicare IM given date fields will be blank) Date Medicare IM given:   Medicare IM given by:   Date Additional Medicare IM given:   Additional Medicare IM given by:    Discharge Disposition:    Per UR Regulation:  Reviewed for med. necessity/level of care/duration of stay  If discussed at [place] of Stay Meetings, dates discussed:    Comments:  Jan. 29, 16/Rhonda L. Rosana Hoes, RN, BSN, CCM/Case Management [place] [phone] No discharge needs present of time of review.  Fyi to Ewa Gentry with [facility] on [id number]/will f/u for any needs.

## 2014-10-10 NOTE — Progress Notes (Signed)
[facility] Progress Note   Madeline Booth JOA:[id number] DOB: Sep 01, 1920 DOA: 10/09/2014 PCP: [name], MD  Brief narrative: Madeline Booth is a 81 y.o. female with past medical history of hypertension, A. fib on Coumadin, breast cancer (status post right mastectomy), who presents with right arm pain after fall.   Subjective: No significant complaints. We had an extensive conversation about her prior fractures, the doctors who fixed them, her prior hospitalization.   Assessment/Plan: Principal Problem:   Right humeral fracture - in sling- awaiting ortho eval- ortho called by ED doctor - pain control  Active Problems:   HTN (hypertension) - cont Metoprolol and Losartan    Atrial fibrillation - rate controlled- hold Coumadin- CHA2DS2-VASc Score is 4-started Heparin for bridging- INR was 1.63    Leg edema - has been going on for a few wks now- f/u on ECHO- hold off on Lasix for now    Code Status: Full code Family Communication:  Disposition Plan: to be determined DVT prophylaxis: Heparin infusion  Consultants: ortho  Procedures:   Antibiotics: Anti-infectives    None         Objective: Filed Weights   10/10/14 0500  Weight: 74.9 kg (165 lb 2 oz)   No intake or output data in the 24 hours ending 10/10/14 0947   Vitals Filed Vitals:   10/10/14 0244 10/10/14 0300 10/10/14 0400 10/10/14 0500  BP: 149/99 137/69 122/77 133/92  Pulse: 101 84 93 97  Temp:    98.2 F (36.8 C)  TempSrc:    Oral  Resp: 17 13 13 18   Height:    5\' 4"  (1.626 m)  Weight:    74.9 kg (165 lb 2 oz)  SpO2: 98% 96% 96% 98%    Exam: General: AAO x3, No acute respiratory distress- right arm in sling Lungs: Clear to auscultation bilaterally without wheezes or crackles Cardiovascular: Regular rate and rhythm without murmur gallop or rub normal S1 and S2 Abdomen: Nontender, nondistended, soft, bowel sounds positive, no rebound, no ascites, no appreciable  mass Extremities: No significant cyanosis, clubbing- + 2 edema bilateral lower extremities  Data Reviewed: Basic Metabolic Panel:  Recent Labs Lab 10/10/14 0123 10/10/14 0629  NA 138 138  K 3.8 4.1  CL 104 102  CO2 22 27  GLUCOSE 123* 112*  BUN 17 16  CREATININE 0.67 0.66  CALCIUM 8.5 8.4   Liver Function Tests: No results for input(s): AST, ALT, ALKPHOS, BILITOT, PROT, ALBUMIN in the last 168 hours. No results for input(s): LIPASE, AMYLASE in the last 168 hours. No results for input(s): AMMONIA in the last 168 hours. CBC:  Recent Labs Lab 10/10/14 0123 10/10/14 0629  WBC 9.4 9.1  NEUTROABS 7.8*  --   HGB 11.9* 11.5*  HCT 36.9 35.4*  MCV 93.4 92.9  PLT 188 173   Cardiac Enzymes:  Recent Labs Lab 10/10/14 0123  CKTOTAL 43  TROPONINI <0.03   BNP (last 3 results) No results for input(s): PROBNP in the last 8760 hours. CBG:  Recent Labs Lab 10/10/14 0757  GLUCAP 95    No results found for this or any previous visit (from the past 240 hour(s)).   Studies:  Recent x-ray studies have been reviewed in detail by the Attending Physician  Scheduled Meds:  Scheduled Meds: . amLODipine  2.5 mg Oral QPM  . losartan  50 mg Oral Daily  . metoprolol succinate  50 mg Oral q morning - 10a  . multivitamin with minerals  1  tablet Oral q morning - 10a  . ondansetron (ZOFRAN) IV  4 mg Intravenous Once  . sodium chloride  3 mL Intravenous Q12H  . sodium chloride  3 mL Intravenous Q12H  . vitamin E  200 Units Oral QPM   Continuous Infusions: . heparin 900 Units/hr (10/10/14 1638)    Time spent on care of this patient: 35 min   [place], MD 10/10/2014, 9:47 AM  LOS: 1 day   [facility] Office  [phone] Pager - Text Page per [email or web address]  If 7PM-7AM, please contact night-coverage [email or web address]

## 2014-10-10 NOTE — ED Notes (Signed)
Patient has 2+ pitting edema to bilateral lower extremities. Patient reports not related to fall today, but new onset in last few months.

## 2014-10-10 NOTE — Consult Note (Signed)
Melrose Nakayama, MD  Chauncey Cruel, PA-C  Loni Dolly, PA-C                                  [facility]                [address] [place], [place]  [address]   ORTHOPAEDIC CONSULTATION  NEETI KNUDTSON            MRN:  [id number] DOB/SEX:  [birth year]/female     CHIEF COMPLAINT:  Painful right shoulder  HISTORY: NYSHA KOPLIN a 81 y.o. female with recent fall and humeral neck fracture on right nondominant side.  Fell at home yesterday on top step of stairs.  Found by neighbor and taken to [facility] via ambulance.  No LOC and has perfect recall.  Ortho consulted last night but they never were able to get by so I was consulted today about an hour ago.  Lives alone and no family nearby.  Has no children but has nephews up in [place].  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.   PAST MEDICAL HISTORY: Patient Active Problem List   Diagnosis Date Noted  . Right humeral fracture 10/10/2014  . Humeral surgical neck fracture 10/10/2014  . Humeral fracture 10/10/2014  . Leg edema 10/10/2014  . Fall   . Warfarin anticoagulation 09/16/2014  . Atrial fibrillation, unspecified   . Leukocytosis   . Chest pain 09/15/2014  . Fracture of sacrum 03/27/2014  . Pelvic fracture 03/27/2014  . Hypokalemia 03/27/2014  . Leukocytosis, unspecified 03/27/2014  . Hyponatremia 03/27/2014  . Nausea and vomiting 03/27/2014  . Syncope and collapse 07/14/2013  . Fracture of left superior pubic ramus 07/14/2013  . Intertrochanteric fracture of right femur 09/21/2011  . Coagulopathy 09/21/2011  . HTN (hypertension) 09/21/2011  . Atrial fibrillation 09/21/2011   Past Medical History  Diagnosis Date  . Hypertension   . Intertrochanteric fracture of right femur 09/21/2011  . Wears glasses   . Breast cancer     Right, s/p mastectomy  . PAF (paroxysmal atrial fibrillation)     On chronic coumadin  . Cataract cortical, senile   . Osteoporosis    Past Surgical History  Procedure Laterality Date  .  Femur im nail  09/21/2011    Procedure: INTRAMEDULLARY (IM) NAIL FEMORAL;  Surgeon: Johnny Bridge;  Location: [facility];  Service: Orthopedics;  Laterality: Right;  . Appendectomy    . Tonsillectomy    . Dilation and curettage of uterus    . Sigmoidoscopy    . Mastectomy Right 1981  . Breast lumpectomy with axillary lymph node biopsy Left 2006    left-Dr Young[facility]  . Open reduction internal fixation (orif) metacarpal Left 12/30/2013    Procedure: OPEN REDUCTION INTERNAL FIXATION (ORIF) METACARPAL DISLOCATION AND COLLATERAL LIGAMENT REPAIR;  Surgeon: [name], MD;  Location: [facility];  Service: Orthopedics;  Laterality: Left;  Open reduction left long finger metacarpophalangeal dislocation     MEDICATIONS:   Current facility-administered medications:  .  0.9 %  sodium chloride infusion, 250 mL, Intravenous, PRN, Ivor Costa, MD .  amLODipine (NORVASC) tablet 2.5 mg, 2.5 mg, Oral, QPM, Ivor Costa, MD .  heparin ADULT infusion 100 units/mL (25000 units/250 mL), 900 Units/hr, Intravenous, Continuous, Dorrene German, [facility], Last Rate: 9 mL/hr at 10/10/14 0642, 900 Units/hr at 10/10/14 0642 .  losartan (COZAAR) tablet 50 mg, 50 mg, Oral, Daily, Soledad Gerlach  Blaine Hamper, MD, 50 mg at 10/10/14 1323 .  metoprolol succinate (TOPROL-XL) 24 hr tablet 50 mg, 50 mg, Oral, q morning - 10a, Ivor Costa, MD, 50 mg at 10/10/14 1323 .  morphine 2 MG/ML injection 2 mg, 2 mg, Intravenous, Q4H PRN, Ivor Costa, MD .  multivitamin with minerals tablet 1 tablet, 1 tablet, Oral, q morning - 10a, Ivor Costa, MD, 1 tablet at 10/10/14 1323 .  ondansetron (ZOFRAN) injection 4 mg, 4 mg, Intravenous, Once, Varney Biles, MD, 4 mg at 10/10/14 0515 .  oxyCODONE-acetaminophen (PERCOCET/ROXICET) 5-325 MG per tablet 1 tablet, 1 tablet, Oral, Q4H PRN, Ivor Costa, MD .  sodium chloride 0.9 % injection 3 mL, 3 mL, Intravenous, Q12H, Ivor Costa, MD, 3 mL at 10/10/14 1000 .  sodium chloride 0.9 % injection 3 mL, 3 mL, Intravenous,  Q12H, Ivor Costa, MD, 3 mL at 10/10/14 1000 .  sodium chloride 0.9 % injection 3 mL, 3 mL, Intravenous, PRN, Ivor Costa, MD .  vitamin E capsule 200 Units, 200 Units, Oral, QPM, Ivor Costa, MD  ALLERGIES:  No Known Allergies  REVIEW OF SYSTEMS: REVIEWED IN DETAIL IN CHART  FAMILY HISTORY:   Family History  Problem Relation Age of Onset  . Cancer      Neg hx  . Aneurysm Father   . Parkinson's disease Sister     SOCIAL HISTORY:   History  Substance Use Topics  . Smoking status: Former Smoker    Types: Cigarettes    Quit date: 03/27/1980  . Smokeless tobacco: Never Used  . Alcohol Use: Yes     Comment: Occasional wine     EXAMINATION: Vital signs in last 24 hours: Temp:  [97.5 F (36.4 C)-98.2 F (36.8 C)] 97.7 F (36.5 C) (01/29 1359) Pulse Rate:  [80-101] 80 (01/29 1359) Resp:  [13-20] 20 (01/29 1359) BP: (122-158)/(63-99) 130/63 mmHg (01/29 1359) SpO2:  [96 %-99 %] 99 % (01/29 1359) Weight:  [165 lb 2 oz (74.9 kg)] 165 lb 2 oz (74.9 kg) (01/29 0500)  BP 130/63 mmHg  Pulse 80  Temp(Src) 97.7 F (36.5 C) (Oral)  Resp 20  Ht 5\' 4"  (1.626 m)  Wt 165 lb 2 oz (74.9 kg)  BMI 28.33 kg/m2  SpO2 99%  General Appearance:    Alert, cooperative, no distress, appears stated age  Head:    Normocephalic, without obvious abnormality, abrasion right orbit area  Eyes:    PERRL, conjunctiva/corneas clear, EOM's intact, fundi    benign, both eyes  Ears:    Normal TM's and external ear canals, both ears  Nose:   Nares normal, septum midline, mucosa normal, no drainage    or sinus tenderness  Throat:   Lips, mucosa, and tongue normal; teeth and gums normal  Neck:   Supple, symmetrical, trachea midline, no adenopathy;    thyroid:  no enlargement/tenderness/nodules; no carotid   bruit or JVD  Back:     Symmetric, no curvature, ROM normal, no CVA tenderness  Lungs:     Clear to auscultation bilaterally, respirations unlabored  Chest Wall:    No tenderness or deformity   Heart:     Irregular rate and rhythm, S1 and S2 normal, mild murmur     Abdomen:     Soft, non-tender, bowel sounds active all four quadrants,    no masses, no organomegaly        Extremities:  right arm swollen and tender to ROM, CMS OK  Pulses:   2+ and symmetric all extremities  Skin:   Skin color, texture, turgor normal, no rashes or lesions  Lymph nodes:   Cervical, supraclavicular, and axillary nodes normal  Neurologic:   CNII-XII intact, normal strength, sensation and reflexes    throughout    Musculoskeletal Exam:   Right arm tender. Palp pulses, skin OK   DIAGNOSTIC STUDIES: Recent laboratory studies:  Recent Labs  10/10/14 0123 10/10/14 0629  WBC 9.4 9.1  HGB 11.9* 11.5*  HCT 36.9 35.4*  PLT 188 173    Recent Labs  10/10/14 0123 10/10/14 0629  NA 138 138  K 3.8 4.1  CL 104 102  CO2 22 27  BUN 17 16  CREATININE 0.67 0.66  GLUCOSE 123* 112*  CALCIUM 8.5 8.4   Lab Results  Component Value Date   INR 1.63* 10/10/2014   INR 2.97* 09/17/2014   INR 2.82* 09/16/2014     Recent Radiographic Studies :  Dg Chest 1 View  10/10/2014   CLINICAL DATA:  Status post fall in driveway, with right arm deformity and pain. Concern for chest injury. Initial encounter.  EXAM: CHEST - 1 VIEW  COMPARISON:  CTA of the chest performed 09/16/2014, and chest radiograph performed 09/15/2014  FINDINGS: The lungs are well-aerated. Mild left perihilar airspace opacity could reflect mild pneumonia or mild pulmonary edema. A small left pleural effusion is seen. The right lung appears relatively clear. There is no evidence of pneumothorax.  The cardiomediastinal silhouette is borderline normal in size. No acute osseous abnormalities are seen. Clips are seen overlying the right axilla.  IMPRESSION: Mild left perihilar airspace opacity could reflect mild pneumonia or mild pulmonary edema. Small left pleural effusion seen. No displaced rib fractures identified.   Electronically Signed   By: Garald Balding  M.D.   On: 10/10/2014 02:52   Dg Chest 2 View  09/15/2014   CLINICAL DATA:  Chest heaviness and nausea. Tachycardia and atrial fibrillation.  EXAM: CHEST - 2 VIEW  COMPARISON:  07/13/2013  FINDINGS: The heart size and mediastinal contours are within normal limits. Stable scarring in the left mid lung. There is new subsegmental atelectasis at the left lung base. There is no evidence of pulmonary edema, focal consolidation, pneumothorax, nodule or pleural fluid. The spine shows stable osteopenia and spondylosis. Clips are present in the right axilla likely related to prior breast surgery.  IMPRESSION: Atelectasis at the left lung base.  Stable left lung scarring.   Electronically Signed   By: Aletta Edouard M.D.   On: 09/15/2014 09:00   Dg Pelvis 1-2 Views  10/10/2014   CLINICAL DATA:  Status post fall in driveway, with concern for pelvic injury. Initial encounter.  EXAM: PELVIS - 1-2 VIEW  COMPARISON:  Pelvis radiograph performed 09/21/2011, and CT of the pelvis from 03/17/2014  FINDINGS: There is no evidence of acute fracture. The patient is status post placement of intramedullary rod and screw in the proximal right femur. There appears to be mild loosening about the intramedullary rod. There is chronic deformity involving the pubic rami bilaterally.  Both femoral heads remain seated within their respective acetabuli. The sacroiliac joints demonstrate mild sclerotic change. Mild degenerative change is noted at the lower lumbar spine. The visualized bowel gas pattern is grossly unremarkable. Diffuse vascular calcifications are seen.  IMPRESSION: 1. No evidence of acute fracture. 2. Apparent mild loosening about the intramedullary rod within the proximal right femur, though visualized portions of the hardware appear intact. 3. Diffuse vascular calcifications along the femoral regions bilaterally.   Electronically  Signed   By: Garald Balding M.D.   On: 10/10/2014 02:49   Dg Shoulder Right  10/10/2014    CLINICAL DATA:  Status post fall in driveway, with obvious deformity at the right shoulder. Initial encounter.  EXAM: RIGHT SHOULDER - 2+ VIEW  COMPARISON:  None.  FINDINGS: There is a displaced fracture through the right humeral neck, with approximately 1 shaft width medial and anterior displacement, and shortening at the fracture site, and significant lateral rotation of the right humeral head. Underlying mild degenerative change is noted at the right glenohumeral joint, with osteophyte formation about the humeral head.  The right humeral head is seated within the glenoid fossa. Mild degenerative change is noted at the right acromioclavicular joint. Scattered clips are seen overlying the right axilla. No significant soft tissue abnormalities are seen. The right lung appears clear.  IMPRESSION: Displaced fracture through the right humeral neck, with approximately 1 shaft width medial and anterior displacement, and shortening at the fracture site, and significant lateral rotation of the humeral head. Underlying mild degenerative change at the right glenohumeral joint.   Electronically Signed   By: Garald Balding M.D.   On: 10/10/2014 02:37   Ct Head Wo Contrast  10/10/2014   CLINICAL DATA:  Patient was walking up stairs and fell onto concrete. Laceration above the right eyebrow. No loss of consciousness. Coumadin therapy.  EXAM: CT HEAD WITHOUT CONTRAST  CT CERVICAL SPINE WITHOUT CONTRAST  TECHNIQUE: Multidetector CT imaging of the head and cervical spine was performed following the standard protocol without intravenous contrast. Multiplanar CT image reconstructions of the cervical spine were also generated.  COMPARISON:  03/27/2014  FINDINGS: CT HEAD FINDINGS  Diffuse cerebral atrophy. Ventricular dilatation consistent with central atrophy. Low-attenuation changes in the deep white matter consistent with small vessel ischemia. No mass effect or midline shift. No abnormal extra-axial fluid collections. Gray-white  matter junctions are distinct. Basal cisterns are not effaced. No evidence of acute intracranial hemorrhage. No depressed skull fractures. Vascular calcifications. Small air-fluid levels in the sphenoid sinus. Visualized paranasal sinuses and mastoid air cells are otherwise clear.  CT CERVICAL SPINE FINDINGS  Diffuse bone demineralization. Diffuse degenerative change throughout the cervical spine with narrowed cervical interspaces and endplate hypertrophic changes. Degenerative changes also seen at C1 to in throughout the facet joints. There is mild anterior subluxation of C4 on C5 which is unchanged since the previous study and is likely degenerative. Slight cortical irregularity at the base of the odontoid process is unchanged since prior study and likely represents old lesion or degenerative change. No vertebral compression deformities. No prevertebral soft tissue swelling. C1-2 articulation appears intact. No focal bone lesion or bone destruction. Bone cortex and trabecular architecture appear intact. Soft tissues are unremarkable. A large pleural effusion demonstrated on the left apex. Azygos lobe.  IMPRESSION: No acute intracranial abnormalities. Chronic atrophy and small vessel ischemic changes.  Diffuse degenerative change throughout the cervical spine. No change in alignment since prior studies. No displaced fractures identified. Large left pleural effusion is incidentally noted.   Electronically Signed   By: Lucienne Capers M.D.   On: 10/10/2014 02:07   Ct Angio Chest Pe W/cm &/or Wo Cm  09/16/2014   CLINICAL DATA:  Pleuritic chest pain question pulmonary embolism, history hypertension, breast cancer, former smoker  EXAM: CT ANGIOGRAPHY CHEST WITH CONTRAST  TECHNIQUE: Multidetector CT imaging of the chest was performed using the standard protocol during bolus administration of intravenous contrast. Multiplanar CT image reconstructions and MIPs were  obtained to evaluate the vascular anatomy.  CONTRAST:   26mL OMNIPAQUE IOHEXOL 350 MG/ML SOLN  COMPARISON:  09/15/2014 and 07/13/2013 chest radiographs  FINDINGS: Scattered atherosclerotic calcifications aorta and coronary arteries.  Small pericardial effusion.  Aorta normal caliber without aneurysmal dilatation.  Inadequate aortic enhancement to assess for dissection.  Pulmonary arteries well opacified and patent.  No evidence of pulmonary embolism.  No thoracic adenopathy.  Azygos fissure noted.  Visualized upper abdomen unremarkable for technique.  Small BILATERAL pleural effusions and adjacent atelectasis of the lower lobes, greater on LEFT.  Central peribronchial thickening.  No infiltrate or pneumothorax.  Diffuse osseous demineralization with RIGHT glenohumeral degenerative changes.  Old appearing manubrial fracture.  Diffuse osseous demineralization with scattered degenerative disc and facet disease changes thoracic spine.  Mild diffuse sclerosis of the T7 vertebra with mild superior endplate height loss.  Chronic compression fracture of T10 vertebra.  Review of the MIP images confirms the above findings.  IMPRESSION: No evidence of pulmonary embolism.  Extensive atherosclerotic disease.  BILATERAL pleural effusions and atelectasis greater on LEFT.  Old manubrial and T10 fractures.  Osseous demineralization.  Minimal height loss and mild sclerosis in T7 vertebra, could be related to mild compression fracture though underlying sclerotic process such as metastatic disease not entirely excluded.  If patient has prior outside exams that can help assess stability, recommend comparison.  In the absence of prior studies, consider MR follow-up.   Electronically Signed   By: Lavonia Dana M.D.   On: 09/16/2014 12:24   Ct Cervical Spine Wo Contrast  10/10/2014   CLINICAL DATA:  Patient was walking up stairs and fell onto concrete. Laceration above the right eyebrow. No loss of consciousness. Coumadin therapy.  EXAM: CT HEAD WITHOUT CONTRAST  CT CERVICAL SPINE WITHOUT  CONTRAST  TECHNIQUE: Multidetector CT imaging of the head and cervical spine was performed following the standard protocol without intravenous contrast. Multiplanar CT image reconstructions of the cervical spine were also generated.  COMPARISON:  03/27/2014  FINDINGS: CT HEAD FINDINGS  Diffuse cerebral atrophy. Ventricular dilatation consistent with central atrophy. Low-attenuation changes in the deep white matter consistent with small vessel ischemia. No mass effect or midline shift. No abnormal extra-axial fluid collections. Gray-white matter junctions are distinct. Basal cisterns are not effaced. No evidence of acute intracranial hemorrhage. No depressed skull fractures. Vascular calcifications. Small air-fluid levels in the sphenoid sinus. Visualized paranasal sinuses and mastoid air cells are otherwise clear.  CT CERVICAL SPINE FINDINGS  Diffuse bone demineralization. Diffuse degenerative change throughout the cervical spine with narrowed cervical interspaces and endplate hypertrophic changes. Degenerative changes also seen at C1 to in throughout the facet joints. There is mild anterior subluxation of C4 on C5 which is unchanged since the previous study and is likely degenerative. Slight cortical irregularity at the base of the odontoid process is unchanged since prior study and likely represents old lesion or degenerative change. No vertebral compression deformities. No prevertebral soft tissue swelling. C1-2 articulation appears intact. No focal bone lesion or bone destruction. Bone cortex and trabecular architecture appear intact. Soft tissues are unremarkable. A large pleural effusion demonstrated on the left apex. Azygos lobe.  IMPRESSION: No acute intracranial abnormalities. Chronic atrophy and small vessel ischemic changes.  Diffuse degenerative change throughout the cervical spine. No change in alignment since prior studies. No displaced fractures identified. Large left pleural effusion is incidentally  noted.   Electronically Signed   By: Lucienne Capers M.D.   On: 10/10/2014 02:07   Dg Humerus  Right  10/10/2014   CLINICAL DATA:  Status post fall in driveway, with obvious deformity at the right shoulder, and pain radiating down the right arm. Initial encounter.  EXAM: RIGHT HUMERUS - 2+ VIEW  COMPARISON:  None.  FINDINGS: There is a significantly displaced fracture at the right humeral neck, with 1 shaft width medial and anterior displacement of the distal humerus, and significant lateral rotation of the humeral head.  No additional fractures are seen. The distal humerus appears intact. The elbow joint is grossly unremarkable. Mild degenerative change is noted at the right acromioclavicular joint.  IMPRESSION: Significantly displaced fracture of the right humeral neck, with 1 shaft width medial and anterior displacement of the distal humerus, and significant lateral rotation of the humeral head.   Electronically Signed   By: Garald Balding M.D.   On: 10/10/2014 02:45    ASSESSMENT:  Right humeral neck fracture   PLAN:  Has completely displaced fracture.  Has some significant surgical risks and is more than 81 yo but still very active in playing the organ and yard work.  Lives alone. Would like to give her a chance to reduce this closed before thinking about surgery.  Suggest she gets up with PT in her sling. If she is stable walking can go home and follow up with me next Wednesday for repeat films.  May reduce on own and if it doesn't can discuss other options then.   Younique Casad G 10/10/2014, 8:19 PM

## 2014-10-10 NOTE — H&P (Signed)
[facility] History and Physical  [name] IEP:[id number] DOB: February 19, 1920 DOA: 10/09/2014  Referring physician: ED physician PCP: [name], MD  Specialists:   Chief Complaint: right arm pain after fall  HPI: Madeline Booth is a 81 y.o. female with past medical history of hypertension, A. fib on Coumadin, breast cancer (status post right mastectomy), who presents with right arm pain after fall.  Patient reports that there are three stairs in her back porch, but the third stair is slightly higher than other two steps. She tripped in her back porch at about 10:30 PM and sustained a fall. She injured her right upper arm and developed swelling and pain over right upper arm and shoulder. Sensation is normal. Patient did not have altered mental status, loss of consciousness or new weakness and numbness. She has bilateral leg edema. Pt denies fever, chills, fatigue, headaches, cough, chest pain, SOB, abdominal pain, diarrhea, constipation, dysuria, urgency, frequency, hematuria, skin rashes.   Work up in the ED demonstrates INR=1.63. X-ray showed significantly displaced fracture of the right humeral neck, with 1shaft width medial and anterior displacement of the distal humerus, and significant lateral rotation of the humeral head. Hemoglobin stable. Patient is admitted to inpatient for further evaluation and treatment. Orthopedic surgery is consulted by ED.  Review of Systems: As presented in the history of presenting illness.  Where does patient live?  At home Can patient participate in ADLs? Yes  Allergy: No Known Allergies  Past Medical History  Diagnosis Date  . Hypertension   . Intertrochanteric fracture of right femur 09/21/2011  . Wears glasses   . Breast cancer     Right, s/p mastectomy  . PAF (paroxysmal atrial fibrillation)     On chronic coumadin  . Cataract cortical, senile   . Osteoporosis     Past Surgical History  Procedure Laterality Date  . Femur  im nail  09/21/2011    Procedure: INTRAMEDULLARY (IM) NAIL FEMORAL;  Surgeon: Johnny Bridge;  Location: [facility];  Service: Orthopedics;  Laterality: Right;  . Appendectomy    . Tonsillectomy    . Dilation and curettage of uterus    . Sigmoidoscopy    . Mastectomy Right 1981  . Breast lumpectomy with axillary lymph node biopsy Left 2006    left-Dr Young-[facility]  . Open reduction internal fixation (orif) metacarpal Left 12/30/2013    Procedure: OPEN REDUCTION INTERNAL FIXATION (ORIF) METACARPAL DISLOCATION AND COLLATERAL LIGAMENT REPAIR;  Surgeon: [name], MD;  Location: [facility];  Service: Orthopedics;  Laterality: Left;  Open reduction left long finger metacarpophalangeal dislocation    Social History:  reports that she quit smoking about 34 years ago. Her smoking use included Cigarettes. She has never used smokeless tobacco. She reports that she drinks alcohol. She reports that she does not use illicit drugs.  Family History:  Family History  Problem Relation Age of Onset  . Cancer      Neg hx  . Aneurysm Father   . Parkinson's disease Sister      Prior to Admission medications   Medication Sig Start Date End Date Taking? Authorizing Provider  alendronate (FOSAMAX) 70 MG tablet Take 70 mg by mouth every Sunday. Take with a full glass of water on an empty stomach.   Yes Historical Provider, MD  amLODipine (NORVASC) 5 MG tablet Take 2.5 mg by mouth every evening.    Yes Historical Provider, MD  Calcium-Vitamin D (CALTRATE 600 PLUS-VIT D PO) Take 1  tablet by mouth 2 (two) times daily.   Yes Historical Provider, MD  Cholecalciferol (MAXIMUM D3) 10000 UNITS CAPS Take 10,000 Units by mouth every Wednesday.   Yes Historical Provider, MD  losartan (COZAAR) 50 MG tablet Take 50 mg by mouth daily.    Yes Historical Provider, MD  metoprolol succinate (TOPROL-XL) 50 MG 24 hr tablet Take 50 mg by mouth every morning. Take with or immediately following a meal.   Yes Historical  Provider, MD  Multiple Vitamins-Minerals (CENTRUM SILVER) tablet Take 1 tablet by mouth every morning.   Yes Historical Provider, MD  vitamin E 200 UNIT capsule Take 200 Units by mouth every evening.   Yes Historical Provider, MD  warfarin (COUMADIN) 2.5 MG tablet Take 1.25 mg by mouth every morning.    Yes Historical Provider, MD  acetaminophen (TYLENOL) 325 MG tablet Take 2 tablets (650 mg total) by mouth every 4 (four) hours as needed for headache or mild pain. Patient not taking: Reported on 10/09/2014 09/17/14   [name], MD  senna (SENOKOT) 8.6 MG TABS tablet Take 1 tablet (8.6 mg total) by mouth 2 (two) times daily. Patient not taking: Reported on 09/15/2014 03/29/14   [name], MD    Physical Exam: Filed Vitals:   10/10/14 0215 10/10/14 0244 10/10/14 0300 10/10/14 0400  BP: 149/99 149/99 137/69 122/77  Pulse:  101 84 93  Temp:      TempSrc:      Resp: 17 17 13 13   SpO2:  98% 96% 96%   General: Not in acute distress HEENT:       Eyes: PERRL, EOMI, no scleral icterus       ENT: No discharge from the ears and nose, no pharynx injection, no tonsillar enlargement.        Neck: No JVD, no bruit, no mass felt. Cardiac: W1/U2, RRR, 3/6 systolic murmurs, No gallops or rubs Pulm: Good air movement bilaterally. Clear to auscultation bilaterally. No rales, wheezing, rhonchi or rubs. Abd: Soft, nondistended, nontender, no rebound pain, no organomegaly, BS present Ext: Right shoulder is swelling, tenderness over the shoulder, and distal humerus. Pt is neurovascularly intact. Patient has 2+ pitting leg edema bilaterally. 2+DP/PT pulse bilaterally Musculoskeletal: No joint deformities, erythema, or stiffness, ROM full Skin: No rashes.  Neuro: Alert and oriented X3, cranial nerves II-XII grossly intact, muscle strength 5/5 in all extremeties, sensation to light touch intact.  Psych: Patient is not psychotic, no suicidal or hemocidal ideation.  Labs on Admission:  Basic Metabolic  Panel:  Recent Labs Lab 10/10/14 0123  NA 138  K 3.8  CL 104  CO2 22  GLUCOSE 123*  BUN 17  CREATININE 0.67  CALCIUM 8.5   Liver Function Tests: No results for input(s): AST, ALT, ALKPHOS, BILITOT, PROT, ALBUMIN in the last 168 hours. No results for input(s): LIPASE, AMYLASE in the last 168 hours. No results for input(s): AMMONIA in the last 168 hours. CBC:  Recent Labs Lab 10/10/14 0123  WBC 9.4  NEUTROABS 7.8*  HGB 11.9*  HCT 36.9  MCV 93.4  PLT 188   Cardiac Enzymes:  Recent Labs Lab 10/10/14 0123  CKTOTAL 60  TROPONINI <0.03    BNP (last 3 results) No results for input(s): PROBNP in the last 8760 hours. CBG: No results for input(s): GLUCAP in the last 168 hours.  Radiological Exams on Admission: Dg Chest 1 View  10/10/2014   CLINICAL DATA:  Status post fall in driveway, with right arm deformity and pain.  Concern for chest injury. Initial encounter.  EXAM: CHEST - 1 VIEW  COMPARISON:  CTA of the chest performed 09/16/2014, and chest radiograph performed 09/15/2014  FINDINGS: The lungs are well-aerated. Mild left perihilar airspace opacity could reflect mild pneumonia or mild pulmonary edema. A small left pleural effusion is seen. The right lung appears relatively clear. There is no evidence of pneumothorax.  The cardiomediastinal silhouette is borderline normal in size. No acute osseous abnormalities are seen. Clips are seen overlying the right axilla.  IMPRESSION: Mild left perihilar airspace opacity could reflect mild pneumonia or mild pulmonary edema. Small left pleural effusion seen. No displaced rib fractures identified.   Electronically Signed   By: Garald Balding M.D.   On: 10/10/2014 02:52   Dg Pelvis 1-2 Views  10/10/2014   CLINICAL DATA:  Status post fall in driveway, with concern for pelvic injury. Initial encounter.  EXAM: PELVIS - 1-2 VIEW  COMPARISON:  Pelvis radiograph performed 09/21/2011, and CT of the pelvis from 03/17/2014  FINDINGS: There is no  evidence of acute fracture. The patient is status post placement of intramedullary rod and screw in the proximal right femur. There appears to be mild loosening about the intramedullary rod. There is chronic deformity involving the pubic rami bilaterally.  Both femoral heads remain seated within their respective acetabuli. The sacroiliac joints demonstrate mild sclerotic change. Mild degenerative change is noted at the lower lumbar spine. The visualized bowel gas pattern is grossly unremarkable. Diffuse vascular calcifications are seen.  IMPRESSION: 1. No evidence of acute fracture. 2. Apparent mild loosening about the intramedullary rod within the proximal right femur, though visualized portions of the hardware appear intact. 3. Diffuse vascular calcifications along the femoral regions bilaterally.   Electronically Signed   By: Garald Balding M.D.   On: 10/10/2014 02:49   Dg Shoulder Right  10/10/2014   CLINICAL DATA:  Status post fall in driveway, with obvious deformity at the right shoulder. Initial encounter.  EXAM: RIGHT SHOULDER - 2+ VIEW  COMPARISON:  None.  FINDINGS: There is a displaced fracture through the right humeral neck, with approximately 1 shaft width medial and anterior displacement, and shortening at the fracture site, and significant lateral rotation of the right humeral head. Underlying mild degenerative change is noted at the right glenohumeral joint, with osteophyte formation about the humeral head.  The right humeral head is seated within the glenoid fossa. Mild degenerative change is noted at the right acromioclavicular joint. Scattered clips are seen overlying the right axilla. No significant soft tissue abnormalities are seen. The right lung appears clear.  IMPRESSION: Displaced fracture through the right humeral neck, with approximately 1 shaft width medial and anterior displacement, and shortening at the fracture site, and significant lateral rotation of the humeral head. Underlying  mild degenerative change at the right glenohumeral joint.   Electronically Signed   By: Garald Balding M.D.   On: 10/10/2014 02:37   Ct Head Wo Contrast  10/10/2014   CLINICAL DATA:  Patient was walking up stairs and fell onto concrete. Laceration above the right eyebrow. No loss of consciousness. Coumadin therapy.  EXAM: CT HEAD WITHOUT CONTRAST  CT CERVICAL SPINE WITHOUT CONTRAST  TECHNIQUE: Multidetector CT imaging of the head and cervical spine was performed following the standard protocol without intravenous contrast. Multiplanar CT image reconstructions of the cervical spine were also generated.  COMPARISON:  03/27/2014  FINDINGS: CT HEAD FINDINGS  Diffuse cerebral atrophy. Ventricular dilatation consistent with central atrophy. Low-attenuation changes in the  deep white matter consistent with small vessel ischemia. No mass effect or midline shift. No abnormal extra-axial fluid collections. Gray-white matter junctions are distinct. Basal cisterns are not effaced. No evidence of acute intracranial hemorrhage. No depressed skull fractures. Vascular calcifications. Small air-fluid levels in the sphenoid sinus. Visualized paranasal sinuses and mastoid air cells are otherwise clear.  CT CERVICAL SPINE FINDINGS  Diffuse bone demineralization. Diffuse degenerative change throughout the cervical spine with narrowed cervical interspaces and endplate hypertrophic changes. Degenerative changes also seen at C1 to in throughout the facet joints. There is mild anterior subluxation of C4 on C5 which is unchanged since the previous study and is likely degenerative. Slight cortical irregularity at the base of the odontoid process is unchanged since prior study and likely represents old lesion or degenerative change. No vertebral compression deformities. No prevertebral soft tissue swelling. C1-2 articulation appears intact. No focal bone lesion or bone destruction. Bone cortex and trabecular architecture appear intact. Soft  tissues are unremarkable. A large pleural effusion demonstrated on the left apex. Azygos lobe.  IMPRESSION: No acute intracranial abnormalities. Chronic atrophy and small vessel ischemic changes.  Diffuse degenerative change throughout the cervical spine. No change in alignment since prior studies. No displaced fractures identified. Large left pleural effusion is incidentally noted.   Electronically Signed   By: Lucienne Capers M.D.   On: 10/10/2014 02:07   Ct Cervical Spine Wo Contrast  10/10/2014   CLINICAL DATA:  Patient was walking up stairs and fell onto concrete. Laceration above the right eyebrow. No loss of consciousness. Coumadin therapy.  EXAM: CT HEAD WITHOUT CONTRAST  CT CERVICAL SPINE WITHOUT CONTRAST  TECHNIQUE: Multidetector CT imaging of the head and cervical spine was performed following the standard protocol without intravenous contrast. Multiplanar CT image reconstructions of the cervical spine were also generated.  COMPARISON:  03/27/2014  FINDINGS: CT HEAD FINDINGS  Diffuse cerebral atrophy. Ventricular dilatation consistent with central atrophy. Low-attenuation changes in the deep white matter consistent with small vessel ischemia. No mass effect or midline shift. No abnormal extra-axial fluid collections. Gray-white matter junctions are distinct. Basal cisterns are not effaced. No evidence of acute intracranial hemorrhage. No depressed skull fractures. Vascular calcifications. Small air-fluid levels in the sphenoid sinus. Visualized paranasal sinuses and mastoid air cells are otherwise clear.  CT CERVICAL SPINE FINDINGS  Diffuse bone demineralization. Diffuse degenerative change throughout the cervical spine with narrowed cervical interspaces and endplate hypertrophic changes. Degenerative changes also seen at C1 to in throughout the facet joints. There is mild anterior subluxation of C4 on C5 which is unchanged since the previous study and is likely degenerative. Slight cortical  irregularity at the base of the odontoid process is unchanged since prior study and likely represents old lesion or degenerative change. No vertebral compression deformities. No prevertebral soft tissue swelling. C1-2 articulation appears intact. No focal bone lesion or bone destruction. Bone cortex and trabecular architecture appear intact. Soft tissues are unremarkable. A large pleural effusion demonstrated on the left apex. Azygos lobe.  IMPRESSION: No acute intracranial abnormalities. Chronic atrophy and small vessel ischemic changes.  Diffuse degenerative change throughout the cervical spine. No change in alignment since prior studies. No displaced fractures identified. Large left pleural effusion is incidentally noted.   Electronically Signed   By: Lucienne Capers M.D.   On: 10/10/2014 02:07   Dg Humerus Right  10/10/2014   CLINICAL DATA:  Status post fall in driveway, with obvious deformity at the right shoulder, and pain radiating down the right  arm. Initial encounter.  EXAM: RIGHT HUMERUS - 2+ VIEW  COMPARISON:  None.  FINDINGS: There is a significantly displaced fracture at the right humeral neck, with 1 shaft width medial and anterior displacement of the distal humerus, and significant lateral rotation of the humeral head.  No additional fractures are seen. The distal humerus appears intact. The elbow joint is grossly unremarkable. Mild degenerative change is noted at the right acromioclavicular joint.  IMPRESSION: Significantly displaced fracture of the right humeral neck, with 1 shaft width medial and anterior displacement of the distal humerus, and significant lateral rotation of the humeral head.   Electronically Signed   By: Garald Balding M.D.   On: 10/10/2014 02:45    EKG: Independently reviewed. Afib  Assessment/Plan Principal Problem:   Right humeral fracture Active Problems:   HTN (hypertension)   Atrial fibrillation   Warfarin anticoagulation   Leg edema  Right Humeral  Fracture: As evidenced by x-ray. Patient has moderate pain now. No neurovascular compromise. Orthopedic surgeon was consulted.  - will admit to tele bed - Pain control: morphine prn and percocet - follow up ortho recs - NPO  - type and cross - INR/PTT - Hold coumadin  Hypertension: -Continue amlodipine, Cozaar, metoprolol  Atrial Fibrillation: Heart rate is well controlled. Patient is on Coumadin, but her INR is at 1.63. CHA2DS2-VASc Score is 4. Her score may be higher, upto 5 since she may have undiagnosed CHF (her BNP=647.7, has leg edema and mild pulmonary edema on chest x-ray). Patient may need surgery, therefore need to hold Coumadin. Patient will need heparin bridging -hold Coumadin - Start heparin bridge per pharmacy -Continue metoprolol  Bilateral leg edema: Patient's previous 2-D echo on 07/14/13 showed EF 60% without congestive heart failure. Today, her BNP=647.7, she has leg edema and mild pulmonary edema on chest x-ray, all of which are consistent with congestive heart failure. -Check 2-D echo   DVT ppx: On Heparin   Code Status: Full code (asked, but patient is not ready to make decision yet) Family Communication: None at bed side.          Disposition Plan: Admit to inpatient   Date of Service 10/10/2014    Ivor Costa [facility] Pager [phone]  If 7PM-7AM, please contact night-coverage [email or web address] Password [facility] 10/10/2014, 5:58 AM

## 2014-10-10 NOTE — ED Notes (Signed)
Attempted to call to floow twice. Informed nurse will call this writer back.

## 2014-10-10 NOTE — Progress Notes (Signed)
PHARMACY - HEPARIN  IV heparin infusing @ 900 units/hr - bridge therapy until oral anticoagulation is resumed.  Patient with h/o AFib  Repeat heparin level = 0.55 (goal 0.3-5.4) No complications of therapy noted  Plan:  Continue IV heparin @ 900 units/hr            F/U AM heparin level/CBC  Leone Haven, PharmD 10/10/14 @ 23:34

## 2014-10-10 NOTE — Progress Notes (Signed)
Writer spoke with on call MD at [facility] and representative of [place] [facility] on call service and they are not aware of a consult for this patient. MD notified

## 2014-10-10 NOTE — Progress Notes (Signed)
PT Cancellation Note  Patient Details Name: Madeline Booth MRN: [id number] DOB: [birth year]   Cancelled Treatment:    Reason Eval/Treat Not Completed: Medical issues which prohibited therapy--pt currently on bedrest and ortho consult is pending. Will hold PT at this time and check back anothet day/time.    Weston Anna, MPT Pager: [phone]

## 2014-10-10 NOTE — ED Provider Notes (Signed)
CSN: [id number]     Arrival date & time 10/09/14  2338 History   First MD Initiated Contact with Patient 10/10/14 0009     Chief Complaint  Patient presents with  . Fall     (Consider location/radiation/quality/duration/timing/severity/associated sxs/prior Treatment) HPI Comments: 81 y.o. female with a Past Medical History of atrial fibrillation on chronic Coumadin, history of breast cancer status post right mastectomy, hypertension who presents with a fall. Pt tripped in her back porch, and sustained a fall.  She currently complains of headaches, and mainly Rt shoulder pain. Pt is right handed. No nausea, vomiting, visual complains, seizures, altered mental status, loss of consciousness, new weakness, or numbness.     10 Systems reviewed and are negative for acute change except as noted in the HPI.   Patient is a 81 y.o. female presenting with fall. The history is provided by the patient.  Fall    Past Medical History  Diagnosis Date  . Hypertension   . Intertrochanteric fracture of right femur 09/21/2011  . Wears glasses   . Breast cancer     Right, s/p mastectomy  . PAF (paroxysmal atrial fibrillation)     On chronic coumadin  . Cataract cortical, senile   . Osteoporosis    Past Surgical History  Procedure Laterality Date  . Femur im nail  09/21/2011    Procedure: INTRAMEDULLARY (IM) NAIL FEMORAL;  Surgeon: Johnny Bridge;  Location: [facility];  Service: Orthopedics;  Laterality: Right;  . Appendectomy    . Tonsillectomy    . Dilation and curettage of uterus    . Sigmoidoscopy    . Mastectomy Right 1981  . Breast lumpectomy with axillary lymph node biopsy Left 2006    left-Dr [facility]  . Open reduction internal fixation (orif) metacarpal Left 12/30/2013    Procedure: OPEN REDUCTION INTERNAL FIXATION (ORIF) METACARPAL DISLOCATION AND COLLATERAL LIGAMENT REPAIR;  Surgeon: [name], MD;  Location: [facility];  Service: Orthopedics;  Laterality: Left;   Open reduction left long finger metacarpophalangeal dislocation   Family History  Problem Relation Age of Onset  . Cancer      Neg hx  . Aneurysm Father   . Parkinson's disease Sister    History  Substance Use Topics  . Smoking status: Former Smoker    Types: Cigarettes    Quit date: 03/27/1980  . Smokeless tobacco: Never Used  . Alcohol Use: Yes     Comment: Occasional wine   OB History    No data available     Review of Systems  All other systems reviewed and are negative.     Allergies  Review of patient's allergies indicates no known allergies.  Home Medications   Prior to Admission medications   Medication Sig Start Date End Date Taking? Authorizing Provider  alendronate (FOSAMAX) 70 MG tablet Take 70 mg by mouth every Sunday. Take with a full glass of water on an empty stomach.   Yes Historical Provider, MD  amLODipine (NORVASC) 5 MG tablet Take 2.5 mg by mouth every evening.    Yes Historical Provider, MD  Calcium-Vitamin D (CALTRATE 600 PLUS-VIT D PO) Take 1 tablet by mouth 2 (two) times daily.   Yes Historical Provider, MD  Cholecalciferol (MAXIMUM D3) 10000 UNITS CAPS Take 10,000 Units by mouth every Wednesday.   Yes Historical Provider, MD  losartan (COZAAR) 50 MG tablet Take 50 mg by mouth daily.    Yes Historical Provider, MD  metoprolol succinate (TOPROL-XL) 50  MG 24 hr tablet Take 50 mg by mouth every morning. Take with or immediately following a meal.   Yes Historical Provider, MD  Multiple Vitamins-Minerals (CENTRUM SILVER) tablet Take 1 tablet by mouth every morning.   Yes Historical Provider, MD  vitamin E 200 UNIT capsule Take 200 Units by mouth every evening.   Yes Historical Provider, MD  warfarin (COUMADIN) 2.5 MG tablet Take 1.25 mg by mouth every morning.    Yes Historical Provider, MD  acetaminophen (TYLENOL) 325 MG tablet Take 2 tablets (650 mg total) by mouth every 4 (four) hours as needed for headache or mild pain. Patient not taking: Reported  on 10/09/2014 09/17/14   [name], MD  senna (SENOKOT) 8.6 MG TABS tablet Take 1 tablet (8.6 mg total) by mouth 2 (two) times daily. Patient not taking: Reported on 09/15/2014 03/29/14   Nishant Dhungel, MD   BP 137/69 mmHg  Pulse 84  Temp(Src) 97.5 F (36.4 C) (Oral)  Resp 13  SpO2 96% Physical Exam  Constitutional: She is oriented to person, place, and time. She appears well-developed and well-nourished.  HENT:  Head: Normocephalic and atraumatic.  Dry blood to the Rt forehead, no large laceration.  Eyes: EOM are normal. Pupils are equal, round, and reactive to light.  Neck: Neck supple.  No midline c-spine tenderness, pt able to turn head to 45 degrees bilaterally without any pain and able to flex neck to the chest and extend without any pain or neurologic symptoms.   Cardiovascular: Normal rate.   Murmur heard. Pulmonary/Chest: Effort normal. No respiratory distress.  Abdominal: Soft. She exhibits no distension. There is no tenderness. There is no rebound and no guarding.  Musculoskeletal: She exhibits edema.  Rt shoulder - large bulging, and assymteric compared to the contralateral side. Tenderness over the shoulder, and distal humerus. Pt is neurovascularly intact.  Pelvis is stable. No lower ext tenderness. 3+ pitting edema  Neurological: She is alert and oriented to person, place, and time.  Skin: Skin is warm and dry.  Nursing note and vitals reviewed.   ED Course  Procedures (including critical care time) Labs Review Labs Reviewed  CBC WITH DIFFERENTIAL/PLATELET - Abnormal; Notable for the following:    Hemoglobin 11.9 (*)    Neutrophils Relative % 84 (*)    Neutro Abs 7.8 (*)    Lymphocytes Relative 10 (*)    All other components within normal limits  BASIC METABOLIC PANEL - Abnormal; Notable for the following:    Glucose, Bld 123 (*)    GFR calc non Af Amer 74 (*)    GFR calc Af Amer 86 (*)    All other components within normal limits  PROTIME-INR -  Abnormal; Notable for the following:    Prothrombin Time 19.5 (*)    INR 1.63 (*)    All other components within normal limits  BRAIN NATRIURETIC PEPTIDE - Abnormal; Notable for the following:    B Natriuretic Peptide 647.7 (*)    All other components within normal limits  APTT  CK  TROPONIN I  URINALYSIS, ROUTINE W REFLEX MICROSCOPIC    Imaging Review Dg Chest 1 View  10/10/2014   CLINICAL DATA:  Status post fall in driveway, with right arm deformity and pain. Concern for chest injury. Initial encounter.  EXAM: CHEST - 1 VIEW  COMPARISON:  CTA of the chest performed 09/16/2014, and chest radiograph performed 09/15/2014  FINDINGS: The lungs are well-aerated. Mild left perihilar airspace opacity could reflect mild pneumonia  or mild pulmonary edema. A small left pleural effusion is seen. The right lung appears relatively clear. There is no evidence of pneumothorax.  The cardiomediastinal silhouette is borderline normal in size. No acute osseous abnormalities are seen. Clips are seen overlying the right axilla.  IMPRESSION: Mild left perihilar airspace opacity could reflect mild pneumonia or mild pulmonary edema. Small left pleural effusion seen. No displaced rib fractures identified.   Electronically Signed   By: Garald Balding M.D.   On: 10/10/2014 02:52   Dg Pelvis 1-2 Views  10/10/2014   CLINICAL DATA:  Status post fall in driveway, with concern for pelvic injury. Initial encounter.  EXAM: PELVIS - 1-2 VIEW  COMPARISON:  Pelvis radiograph performed 09/21/2011, and CT of the pelvis from 03/17/2014  FINDINGS: There is no evidence of acute fracture. The patient is status post placement of intramedullary rod and screw in the proximal right femur. There appears to be mild loosening about the intramedullary rod. There is chronic deformity involving the pubic rami bilaterally.  Both femoral heads remain seated within their respective acetabuli. The sacroiliac joints demonstrate mild sclerotic change.  Mild degenerative change is noted at the lower lumbar spine. The visualized bowel gas pattern is grossly unremarkable. Diffuse vascular calcifications are seen.  IMPRESSION: 1. No evidence of acute fracture. 2. Apparent mild loosening about the intramedullary rod within the proximal right femur, though visualized portions of the hardware appear intact. 3. Diffuse vascular calcifications along the femoral regions bilaterally.   Electronically Signed   By: Garald Balding M.D.   On: 10/10/2014 02:49   Dg Shoulder Right  10/10/2014   CLINICAL DATA:  Status post fall in driveway, with obvious deformity at the right shoulder. Initial encounter.  EXAM: RIGHT SHOULDER - 2+ VIEW  COMPARISON:  None.  FINDINGS: There is a displaced fracture through the right humeral neck, with approximately 1 shaft width medial and anterior displacement, and shortening at the fracture site, and significant lateral rotation of the right humeral head. Underlying mild degenerative change is noted at the right glenohumeral joint, with osteophyte formation about the humeral head.  The right humeral head is seated within the glenoid fossa. Mild degenerative change is noted at the right acromioclavicular joint. Scattered clips are seen overlying the right axilla. No significant soft tissue abnormalities are seen. The right lung appears clear.  IMPRESSION: Displaced fracture through the right humeral neck, with approximately 1 shaft width medial and anterior displacement, and shortening at the fracture site, and significant lateral rotation of the humeral head. Underlying mild degenerative change at the right glenohumeral joint.   Electronically Signed   By: Garald Balding M.D.   On: 10/10/2014 02:37   Ct Head Wo Contrast  10/10/2014   CLINICAL DATA:  Patient was walking up stairs and fell onto concrete. Laceration above the right eyebrow. No loss of consciousness. Coumadin therapy.  EXAM: CT HEAD WITHOUT CONTRAST  CT CERVICAL SPINE WITHOUT  CONTRAST  TECHNIQUE: Multidetector CT imaging of the head and cervical spine was performed following the standard protocol without intravenous contrast. Multiplanar CT image reconstructions of the cervical spine were also generated.  COMPARISON:  03/27/2014  FINDINGS: CT HEAD FINDINGS  Diffuse cerebral atrophy. Ventricular dilatation consistent with central atrophy. Low-attenuation changes in the deep white matter consistent with small vessel ischemia. No mass effect or midline shift. No abnormal extra-axial fluid collections. Gray-white matter junctions are distinct. Basal cisterns are not effaced. No evidence of acute intracranial hemorrhage. No depressed skull fractures. Vascular calcifications.  Small air-fluid levels in the sphenoid sinus. Visualized paranasal sinuses and mastoid air cells are otherwise clear.  CT CERVICAL SPINE FINDINGS  Diffuse bone demineralization. Diffuse degenerative change throughout the cervical spine with narrowed cervical interspaces and endplate hypertrophic changes. Degenerative changes also seen at C1 to in throughout the facet joints. There is mild anterior subluxation of C4 on C5 which is unchanged since the previous study and is likely degenerative. Slight cortical irregularity at the base of the odontoid process is unchanged since prior study and likely represents old lesion or degenerative change. No vertebral compression deformities. No prevertebral soft tissue swelling. C1-2 articulation appears intact. No focal bone lesion or bone destruction. Bone cortex and trabecular architecture appear intact. Soft tissues are unremarkable. A large pleural effusion demonstrated on the left apex. Azygos lobe.  IMPRESSION: No acute intracranial abnormalities. Chronic atrophy and small vessel ischemic changes.  Diffuse degenerative change throughout the cervical spine. No change in alignment since prior studies. No displaced fractures identified. Large left pleural effusion is incidentally  noted.   Electronically Signed   By: Lucienne Capers M.D.   On: 10/10/2014 02:07   Ct Cervical Spine Wo Contrast  10/10/2014   CLINICAL DATA:  Patient was walking up stairs and fell onto concrete. Laceration above the right eyebrow. No loss of consciousness. Coumadin therapy.  EXAM: CT HEAD WITHOUT CONTRAST  CT CERVICAL SPINE WITHOUT CONTRAST  TECHNIQUE: Multidetector CT imaging of the head and cervical spine was performed following the standard protocol without intravenous contrast. Multiplanar CT image reconstructions of the cervical spine were also generated.  COMPARISON:  03/27/2014  FINDINGS: CT HEAD FINDINGS  Diffuse cerebral atrophy. Ventricular dilatation consistent with central atrophy. Low-attenuation changes in the deep white matter consistent with small vessel ischemia. No mass effect or midline shift. No abnormal extra-axial fluid collections. Gray-white matter junctions are distinct. Basal cisterns are not effaced. No evidence of acute intracranial hemorrhage. No depressed skull fractures. Vascular calcifications. Small air-fluid levels in the sphenoid sinus. Visualized paranasal sinuses and mastoid air cells are otherwise clear.  CT CERVICAL SPINE FINDINGS  Diffuse bone demineralization. Diffuse degenerative change throughout the cervical spine with narrowed cervical interspaces and endplate hypertrophic changes. Degenerative changes also seen at C1 to in throughout the facet joints. There is mild anterior subluxation of C4 on C5 which is unchanged since the previous study and is likely degenerative. Slight cortical irregularity at the base of the odontoid process is unchanged since prior study and likely represents old lesion or degenerative change. No vertebral compression deformities. No prevertebral soft tissue swelling. C1-2 articulation appears intact. No focal bone lesion or bone destruction. Bone cortex and trabecular architecture appear intact. Soft tissues are unremarkable. A large  pleural effusion demonstrated on the left apex. Azygos lobe.  IMPRESSION: No acute intracranial abnormalities. Chronic atrophy and small vessel ischemic changes.  Diffuse degenerative change throughout the cervical spine. No change in alignment since prior studies. No displaced fractures identified. Large left pleural effusion is incidentally noted.   Electronically Signed   By: Lucienne Capers M.D.   On: 10/10/2014 02:07   Dg Humerus Right  10/10/2014   CLINICAL DATA:  Status post fall in driveway, with obvious deformity at the right shoulder, and pain radiating down the right arm. Initial encounter.  EXAM: RIGHT HUMERUS - 2+ VIEW  COMPARISON:  None.  FINDINGS: There is a significantly displaced fracture at the right humeral neck, with 1 shaft width medial and anterior displacement of the distal humerus, and significant  lateral rotation of the humeral head.  No additional fractures are seen. The distal humerus appears intact. The elbow joint is grossly unremarkable. Mild degenerative change is noted at the right acromioclavicular joint.  IMPRESSION: Significantly displaced fracture of the right humeral neck, with 1 shaft width medial and anterior displacement of the distal humerus, and significant lateral rotation of the humeral head.   Electronically Signed   By: Garald Balding M.D.   On: 10/10/2014 02:45     EKG Interpretation   Date/Time:  Friday October 10 2014 01:37:59 EST Ventricular Rate:  91 PR Interval:    QRS Duration: 87 QT Interval:  491 QTC Calculation: 604 R Axis:   75 Text Interpretation:  Atrial fibrillation PVC Nonspecific T abnormalities,  diffuse leads QT doesnt appear to be prolonged grossly - repeat will be  ordered No significant change since last tracing Confirmed by Brooke Payes,  MD, Leoni Goodness ([id number]) on 10/10/2014 1:47:32 AM      4:10 AM  Pt has a humeral neck fracture, displaced. She has had service from Dr. Percell Miller before, so we called their service, and spoke with Dr.  Noemi Chapel, who really would prefer the orthopedic on call take care of this patient. We have paged ortho on call @3 :30, medicine will admit. Pt aware of the diagnosis. SLine to be placed. She is neurovascularly intact. MDM   Final diagnoses:  Fall  Humeral surgical neck fracture, right, closed, initial encounter  Contusion   Pt comes in with cc of fall.  DDx includes: - Mechanical falls - ICH - Fractures - Contusions - Soft tissue injury  Pt has sustained a Right humeral neck fx, displaced and angulated -that might need surgical intervention. Pt lives by herself, aox3 ,and independent. She is also noted to have bilateral pitting edema, which is new. Xrays show mild pulm edema in the chest.  Medicine to admit and optimize. Pt on coumadin, inr checked. She also has pitting edema - which might need evaluation.     Varney Biles, MD 10/10/14 [phone]

## 2014-10-10 NOTE — Progress Notes (Signed)
OT Cancellation Note  Patient Details Name: LETHER TESCH MRN: [id number] DOB: May 20, 1920   Cancelled Treatment:    Reason Eval/Treat Not Completed: Other (comment).  Noted ortho consult pending. Will check back.  Osei Anger 10/10/2014, 7:26 AM  Lesle Chris, OTR/L [phone] 10/10/2014

## 2014-10-10 NOTE — Progress Notes (Signed)
ANTICOAGULATION CONSULT NOTE - Initial Consult  Pharmacy Consult for IV Heparin Indication: atrial fibrillation  No Known Allergies  Patient Measurements:   TBW=64 kg  Vital Signs: Temp: 97.5 F (36.4 C) (01/28 2339) Temp Source: Oral (01/28 2339) BP: 122/77 mmHg (01/29 0400) Pulse Rate: 93 (01/29 0400)  Labs:  Recent Labs  10/10/14 0123  HGB 11.9*  HCT 36.9  PLT 188  APTT 33  LABPROT 19.5*  INR 1.63*  CREATININE 0.67  CKTOTAL 60  TROPONINI <0.03    CrCl cannot be calculated (Unknown ideal weight.).   Medical History: Past Medical History  Diagnosis Date  . Hypertension   . Intertrochanteric fracture of right femur 09/21/2011  . Wears glasses   . Breast cancer     Right, s/p mastectomy  . PAF (paroxysmal atrial fibrillation)     On chronic coumadin  . Cataract cortical, senile   . Osteoporosis     Medications:  Scheduled:  . amLODipine  2.5 mg Oral QPM  . losartan  50 mg Oral Daily  . metoprolol succinate  50 mg Oral q morning - 10a  . multivitamin with minerals  1 tablet Oral q morning - 10a  . ondansetron (ZOFRAN) IV  4 mg Intravenous Once  . sodium chloride  3 mL Intravenous Q12H  . sodium chloride  3 mL Intravenous Q12H  . vitamin E  200 Units Oral QPM   Infusions:  . heparin      Assessment: 89 yoF s/p fall on chronic warfarin for A-fb.  IV heparin per Rx for bridging while off warfarin for possible surgery due to arm fracture.  CHA2D2-VAS score is 4.  Goal of Therapy:  Heparin level 0.3-0.7 units/ml Monitor platelets by anticoagulation protocol: Yes   Plan:   Heparin drip @ 900 units/hr ( No bolus)  Check 1st HL in 8 hours  Daily CBC/HL  Lawana Pai R 10/10/2014,5:53 AM

## 2014-10-10 NOTE — Progress Notes (Signed)
Clinical Social Work Department BRIEF PSYCHOSOCIAL ASSESSMENT 10/10/2014  Patient:  Madeline Booth, Madeline Booth     Account Number:  [id number]     Admit date:  10/09/2014  Clinical Social Worker:  Earlie Server  Date/Time:  10/10/2014 11:00 AM  Referred by:  Physician  Date Referred:  10/10/2014 Referred for  SNF Placement   Other Referral:   Interview type:  Patient Other interview type:    PSYCHOSOCIAL DATA Living Status:  ALONE Admitted from facility:   Level of care:   Primary support name:  Sherri Primary support relationship to patient:  FRIEND Degree of support available:   Strong    CURRENT CONCERNS Current Concerns  Post-Acute Placement   Other Concerns:    SOCIAL WORK ASSESSMENT / PLAN CSW received referral in order to assist with DC planning. CSW reviewed chart and met with patient at bedside. CSW introduced myself and explained role.    Patient reports that she lives at home alone. Patient is not married and does not have children. Patient reports that she has 3 nephews but they do not live locally. Patient is very involved with the community and reports that she has good friends that are supportive. Patient reports she had come home from a concert and fell going up her stairs to her house. Patient called for a neighbor who came to assist her. Patient reports she has been to rehab in the past when she has fractured her hips but does not feel that rehab is needed now. Patient has been to [facility] and [facility] in the past but reports she does not want to return to SNF at this time. Patient reports she has done well at home and will ask friends for support but does not want SNF. CSW left SNF list with CSW contact information in case patient changed her mind.    Patient agreeable to [facility] at DC and reports she has used [facility] in the past. CSW is signing off but available if further needs arise.   Assessment/plan status:  No Further Intervention Required Other  assessment/ plan:   Information/referral to community resources:   SNF information    PATIENT'S/FAMILY'S RESPONSE TO PLAN OF CARE: Patient alert and oriented. Patient reports that she is very independent and since she was told that her legs were not damaged, patient reports she can return home. Patient reports that she has been to rehab in the past and is aware of her needs. Patient thanked CSW for her concern but reports she is not in need of any SNF at this time. Patient will call if she changes her mind.       [place], [place] [phone]

## 2014-10-10 NOTE — Progress Notes (Signed)
ANTICOAGULATION CONSULT NOTE - Follow Up  Pharmacy Consult for IV heparin Indication: atrial fibrillation   No Known Allergies  Patient Measurements: Height: 5\' 4"  (162.6 cm) Weight: 165 lb 2 oz (74.9 kg) IBW/kg (Calculated) : 54.7   Vital Signs: Temp: 97.7 F (36.5 C) (01/29 1359) Temp Source: Oral (01/29 1359) BP: 130/63 mmHg (01/29 1359) Pulse Rate: 80 (01/29 1359)  Labs:  Recent Labs  10/10/14 0123 10/10/14 0629 10/10/14 1415  HGB 11.9* 11.5*  --   HCT 36.9 35.4*  --   PLT 188 173  --   APTT 33  --   --   LABPROT 19.5*  --   --   INR 1.63*  --   --   HEPARINUNFRC  --   --  0.47  CREATININE 0.67 0.66  --   CKTOTAL 60  --   --   TROPONINI <0.03  --   --     Estimated Creatinine Clearance: 44.5 mL/min (by C-G formula based on Cr of 0.66).   Medical History: Past Medical History  Diagnosis Date  . Hypertension   . Intertrochanteric fracture of right femur 09/21/2011  . Wears glasses   . Breast cancer     Right, s/p mastectomy  . PAF (paroxysmal atrial fibrillation)     On chronic coumadin  . Cataract cortical, senile   . Osteoporosis     Medications:  Scheduled:  . amLODipine  2.5 mg Oral QPM  . losartan  50 mg Oral Daily  . metoprolol succinate  50 mg Oral q morning - 10a  . multivitamin with minerals  1 tablet Oral q morning - 10a  . ondansetron (ZOFRAN) IV  4 mg Intravenous Once  . sodium chloride  3 mL Intravenous Q12H  . sodium chloride  3 mL Intravenous Q12H  . vitamin E  200 Units Oral QPM   Infusions:  . heparin 900 Units/hr (10/10/14 1017)   PRN: sodium chloride, morphine injection, oxyCODONE-acetaminophen, sodium chloride  Assessment: 81 y/o F on warfarin for atrial fib tripped on her back porch and fell the evening of 1/28, fracturing her R humerus.  Warfarin was held on admission in anticipation of possible surgery; orthopedic consultation is pending.  INR is subtherapeutic so IV heparin was started with pharmacy dosing assistance  requested.  Home warfarin dosage is 1.25mg  daily (1/2 x 2.5mg ), last taken 1/28.   INR 1.63 on admission.  Goal of Therapy:  Heparin level 0.3-0.7 units/ml Monitor platelets by anticoagulation protocol: Yes   This afternoon, 10/10/2014: Heparin level therapeutic on 900 units/hr No reported bleeding Baseline Hgb and Hct slightly low, pltc WNL Awaiting orthopedic consultation and recommendations on whether surgery is indicated.   Plan:  1. Continue Heparin at present rate (900 units/hr) 2. Confirmatory heparin level at 10 PM 3. Await word on possible surgery and need for interruption of heparin infusion. 4. Daily heparin level and CBC  Clayburn Pert, PharmD, BCPS Pager: [phone] 10/10/2014  2:53 PM

## 2014-10-10 NOTE — Progress Notes (Signed)
Patient refused 2D ECHO. MD notified via amion. Per technician, if 2D ECHO still needed it will need to be reschedule for  tomrrow.

## 2014-10-10 NOTE — Progress Notes (Signed)
MD returned call and making contact for ortho consult.

## 2014-10-11 DIAGNOSIS — I482 Chronic atrial fibrillation: Secondary | ICD-10-CM

## 2014-10-11 DIAGNOSIS — I158 Other secondary hypertension: Secondary | ICD-10-CM

## 2014-10-11 LAB — CBC
HCT: 32.8 % — ABNORMAL LOW (ref 36.0–46.0)
Hemoglobin: 10.8 g/dL — ABNORMAL LOW (ref 12.0–15.0)
MCH: 30.5 pg (ref 26.0–34.0)
MCHC: 32.9 g/dL (ref 30.0–36.0)
MCV: 92.7 fL (ref 78.0–100.0)
Platelets: 171 K/uL (ref 150–400)
RBC: 3.54 MIL/uL — ABNORMAL LOW (ref 3.87–5.11)
RDW: 14.8 % (ref 11.5–15.5)
WBC: 7.7 K/uL (ref 4.0–10.5)

## 2014-10-11 LAB — PROTIME-INR
INR: 1.97 — ABNORMAL HIGH (ref 0.00–1.49)
Prothrombin Time: 22.6 s — ABNORMAL HIGH (ref 11.6–15.2)

## 2014-10-11 LAB — HEPARIN LEVEL (UNFRACTIONATED): Heparin Unfractionated: 0.69 [IU]/mL (ref 0.30–0.70)

## 2014-10-11 LAB — GLUCOSE, CAPILLARY: Glucose-Capillary: 118 mg/dL — ABNORMAL HIGH (ref 70–99)

## 2014-10-11 MED ORDER — HEPARIN (PORCINE) IN NACL 100-0.45 UNIT/ML-% IJ SOLN
800.0000 [IU]/h | INTRAMUSCULAR | Status: DC
  Filled 2014-10-11: qty 250

## 2014-10-11 MED ORDER — CALCIUM CARBONATE-VITAMIN D 500-200 MG-UNIT PO TABS
1.0000 | ORAL_TABLET | Freq: Two times a day (BID) | ORAL | Status: DC
  Administered 2014-10-11 – 2014-10-13 (×4): 1 via ORAL
  Filled 2014-10-11 (×6): qty 1

## 2014-10-11 MED ORDER — WARFARIN 1.25 MG HALF TABLET
1.2500 mg | ORAL_TABLET | Freq: Once | ORAL | Status: AC
  Administered 2014-10-11: 1.25 mg via ORAL
  Filled 2014-10-11: qty 1

## 2014-10-11 MED ORDER — WARFARIN - PHARMACIST DOSING INPATIENT: Freq: Every day | Status: DC

## 2014-10-11 MED ORDER — WARFARIN 1.25 MG HALF TABLET
1.2500 mg | ORAL_TABLET | Freq: Once | ORAL | Status: DC
Start: 2014-10-11 — End: 2014-10-11
  Filled 2014-10-11: qty 1

## 2014-10-11 NOTE — Progress Notes (Signed)
Per "friend" and pt -- pt is now willing to go to SNF for rehab. MD please supply order.

## 2014-10-11 NOTE — Progress Notes (Signed)
Pt refused ECHO this morning.

## 2014-10-11 NOTE — Progress Notes (Signed)
[facility] Progress Note   Madeline Booth HGD:[id number] DOB: [birth year] DOA: 10/09/2014 PCP: [name], MD  Brief narrative: Madeline Booth is a 81 y.o. female with past medical history of hypertension, A. fib on Coumadin, breast cancer (status post right mastectomy), who presents with right arm pain after fall.   Subjective: No complaints.   Assessment/Plan: Principal Problem:   Right humeral fracture - in sling- ortho stating that no surgery required at this time- they will f/u as outpt - pain control  Active Problems:   HTN (hypertension) - cont Metoprolol and Losartan    Atrial fibrillation - rate controlled- hold Coumadin- CHA2DS2-VASc Score is 4-started Heparin for bridging- INR was 1.63    Leg edema - has been going on for a few wks now- f/u on ECHO- hold off on Lasix for now    Code Status: Full code Family Communication:  Disposition Plan: SNF per PT eval DVT prophylaxis: Heparin infusion  Consultants: ortho  Procedures:   Antibiotics: Anti-infectives    None     Objective: Filed Weights   10/10/14 0500 10/11/14 0855  Weight: 74.9 kg (165 lb 2 oz) 73.2 kg (161 lb 6 oz)    Intake/Output Summary (Last 24 hours) at 10/11/14 1656 Last data filed at 10/11/14 1300  Gross per 24 hour  Intake    240 ml  Output    125 ml  Net    115 ml     Vitals Filed Vitals:   10/11/14 0806 10/11/14 0855 10/11/14 1153 10/11/14 1450  BP: 125/89  129/70   Pulse: 101  89   Temp: 98.5 F (36.9 C)  99.1 F (37.3 C)   TempSrc: Oral  Oral   Resp: 20  16   Height:      Weight:  73.2 kg (161 lb 6 oz)    SpO2: 97%  97% 95%    Exam: General: AAO x3, No acute respiratory distress- right arm in sling Lungs: Clear to auscultation bilaterally without wheezes or crackles Cardiovascular: Regular rate and rhythm without murmur gallop or rub normal S1 and S2 Abdomen: Nontender, nondistended, soft, bowel sounds positive, no rebound, no ascites, no  appreciable mass Extremities: No significant cyanosis, clubbing- + 2 edema bilateral lower extremities  Data Reviewed: Basic Metabolic Panel:  Recent Labs Lab 10/10/14 0123 10/10/14 0629  NA 138 138  K 3.8 4.1  CL 104 102  CO2 22 27  GLUCOSE 123* 112*  BUN 17 16  CREATININE 0.67 0.66  CALCIUM 8.5 8.4   Liver Function Tests: No results for input(s): AST, ALT, ALKPHOS, BILITOT, PROT, ALBUMIN in the last 168 hours. No results for input(s): LIPASE, AMYLASE in the last 168 hours. No results for input(s): AMMONIA in the last 168 hours. CBC:  Recent Labs Lab 10/10/14 0123 10/10/14 0629 10/11/14 0532  WBC 9.4 9.1 7.7  NEUTROABS 7.8*  --   --   HGB 11.9* 11.5* 10.8*  HCT 36.9 35.4* 32.8*  MCV 93.4 92.9 92.7  PLT 188 173 171   Cardiac Enzymes:  Recent Labs Lab 10/10/14 0123  CKTOTAL 24  TROPONINI <0.03   BNP (last 3 results) No results for input(s): PROBNP in the last 8760 hours. CBG:  Recent Labs Lab 10/10/14 0757 10/11/14 0723  GLUCAP 95 118*    No results found for this or any previous visit (from the past 240 hour(s)).   Studies:  Recent x-ray studies have been reviewed in detail by the Attending Physician  Scheduled Meds:  Scheduled Meds: . amLODipine  2.5 mg Oral QPM  . calcium-vitamin D  1 tablet Oral BID  . losartan  50 mg Oral Daily  . metoprolol succinate  50 mg Oral q morning - 10a  . multivitamin with minerals  1 tablet Oral q morning - 10a  . ondansetron (ZOFRAN) IV  4 mg Intravenous Once  . sodium chloride  3 mL Intravenous Q12H  . sodium chloride  3 mL Intravenous Q12H  . vitamin E  200 Units Oral QPM  . Warfarin - Pharmacist Dosing Inpatient   Does not apply q1800   Continuous Infusions:    Time spent on care of this patient: 42 min   [place], MD 10/11/2014, 4:56 PM  LOS: 2 days   [facility] Office  [phone] Pager - Text Page per [email or web address]  If 7PM-7AM, please contact  night-coverage [email or web address]

## 2014-10-11 NOTE — Evaluation (Signed)
Physical Therapy Evaluation Patient Details Name: Madeline Booth MRN: [id number] DOB: 1919/11/11 Today's Date: 10/11/2014   History of Present Illness  Pt is s/p fall and sustained R humeral neck fracture on nondominant side.   Clinical Impression  Pt will benefit from PT to address deficits below; pt is at risk for falls; recommend SNF or 24hr assist/supervision; will follow; pt is refusing SNF at this time    Follow Up Recommendations SNF;Supervision/Assistance - 24 hour    Equipment Recommendations  None recommended by PT    Recommendations for Other Services       Precautions / Restrictions Precautions Precautions: Fall;Shoulder Shoulder Interventions: Shoulder sling/immobilizer Restrictions Weight Bearing Restrictions: Yes RUE Weight Bearing: Non weight bearing      Mobility  Bed Mobility               General bed mobility comments: OOB in chair.  Transfers Overall transfer level: Needs assistance Equipment used: Straight cane Transfers: Sit to/from Stand Sit to Stand: Min assist         General transfer comment: cues for L hand placement, assist to rise and stabilize; pt with incr postural sway upon standing  Ambulation/Gait Ambulation/Gait assistance: Min assist Ambulation Distance (Feet): 40 Feet Assistive device: Straight cane Gait Pattern/deviations: Step-through pattern;Trunk flexed;Drifts right/left;Wide base of support     General Gait Details: cues for safety, sequence, assist for balance; pt reports dizziness intermittently and reports she wasn't sure she would make it back to the chair   Stairs            Wheelchair Mobility    Modified Rankin (Stroke Patients Only)       Balance Overall balance assessment: Needs assistance Sitting-balance support: No upper extremity supported;Feet supported Sitting balance-Leahy Scale: Fair     Standing balance support: Single extremity supported Standing balance-Leahy Scale: Poor                 High Level Balance Comments: pt with incr postural sway in static stand             Pertinent Vitals/Pain Pain Assessment: 0-10 Pain Score: 5  Pain Location: R shoulder Pain Descriptors / Indicators: Aching Pain Intervention(s): Limited activity within patient's tolerance    Home Living Family/patient expects to be discharged to:: Private residence Living Arrangements: Alone   Type of Home: House Home Access: Stairs to enter Entrance Stairs-Rails: Right Entrance Stairs-Number of Steps: 4 Home Layout: One level Home Equipment: Bedside commode;Cane - single point;Shower seat;Grab bars - tub/shower Additional Comments: grab bars in bathroom    Prior Function Level of Independence: Independent with assistive device(s)         Comments: having more difficulty lately with amb     Hand Dominance   Dominant Hand: Left    Extremity/Trunk Assessment   Upper Extremity Assessment: RUE deficits/detail RUE Deficits / Details: able to move digits, wrist. UE in sling. RUE: Unable to fully assess due to immobilization       Lower Extremity Assessment: Generalized weakness         Communication   Communication: No difficulties  Cognition Arousal/Alertness: Awake/alert Behavior During Therapy: WFL for tasks assessed/performed Overall Cognitive Status: Within Functional Limits for tasks assessed                      General Comments General comments (skin integrity, edema, etc.): min guard assist to pull up underwear.    Exercises  Assessment/Plan    PT Assessment Patient needs continued PT services  PT Diagnosis Difficulty walking   PT Problem List Decreased strength;Decreased activity tolerance;Decreased balance;Decreased mobility;Decreased knowledge of use of DME  PT Treatment Interventions DME instruction;Gait training;Functional mobility training;Therapeutic activities;Therapeutic exercise;Patient/family education;Balance  training   PT Goals (Current goals can be found in the Care Plan section) Acute Rehab PT Goals Patient Stated Goal: home PT Goal Formulation: With patient Time For Goal Achievement: 10/25/14 Potential to Achieve Goals: Good    Frequency Min 3X/week   Barriers to discharge        Co-evaluation               End of Session Equipment Utilized During Treatment: Gait belt Activity Tolerance: Patient tolerated treatment well Patient left: with call bell/phone within reach;in chair Nurse Communication: Mobility status         Time: 1545-1611 PT Time Calculation (min) (ACUTE ONLY): 26 min   Charges:   PT Evaluation $Initial PT Evaluation Tier I: 1 Procedure PT Treatments $Gait Training: 8-22 mins   PT G Codes:        Madeline Booth 10-13-2014, 5:23 PM

## 2014-10-11 NOTE — Progress Notes (Addendum)
ANTICOAGULATION CONSULT NOTE - Follow Up  Pharmacy Consult for IV heparin/warfarin Indication: atrial fibrillation   No Known Allergies  Patient Measurements: Height: 5\' 4"  (162.6 cm) Weight: 165 lb 2 oz (74.9 kg) IBW/kg (Calculated) : 54.7   Vital Signs: Temp: 98.5 F (36.9 C) (01/30 0806) Temp Source: Oral (01/30 0806) BP: 125/89 mmHg (01/30 0806) Pulse Rate: 101 (01/30 0806)  Labs:  Recent Labs  10/10/14 0123 10/10/14 0629 10/10/14 1415 10/10/14 2212 10/11/14 0532  HGB 11.9* 11.5*  --   --  10.8*  HCT 36.9 35.4*  --   --  32.8*  PLT 188 173  --   --  171  APTT 33  --   --   --   --   LABPROT 19.5*  --   --   --   --   INR 1.63*  --   --   --   --   HEPARINUNFRC  --   --  0.47 0.55 0.69  CREATININE 0.67 0.66  --   --   --   CKTOTAL 60  --   --   --   --   TROPONINI <0.03  --   --   --   --     Estimated Creatinine Clearance: 44.5 mL/min (by C-G formula based on Cr of 0.66).   Medical History: Past Medical History  Diagnosis Date  . Hypertension   . Intertrochanteric fracture of right femur 09/21/2011  . Wears glasses   . Breast cancer     Right, s/p mastectomy  . PAF (paroxysmal atrial fibrillation)     On chronic coumadin  . Cataract cortical, senile   . Osteoporosis     Medications:  Scheduled:  . amLODipine  2.5 mg Oral QPM  . losartan  50 mg Oral Daily  . metoprolol succinate  50 mg Oral q morning - 10a  . multivitamin with minerals  1 tablet Oral q morning - 10a  . ondansetron (ZOFRAN) IV  4 mg Intravenous Once  . sodium chloride  3 mL Intravenous Q12H  . sodium chloride  3 mL Intravenous Q12H  . vitamin E  200 Units Oral QPM   Infusions:  . heparin 900 Units/hr (10/10/14 5366)   PRN: sodium chloride, morphine injection, oxyCODONE-acetaminophen, sodium chloride  Assessment: 81 y/o F on warfarin for atrial fib tripped on her back porch and fell the evening of 1/28, fracturing her R humerus.  Warfarin was held on admission in anticipation  of possible surgery; orthopedic consultation is pending.  INR is subtherapeutic so IV heparin was started with pharmacy dosing assistance requested.  Home warfarin dosage is 1.25mg  daily (1/2 x 2.5mg ), last taken 1/28.   INR at admission = 1.63  Today, 10/11/2014:  Todays' INR = 1.97 - INR increased from admission, slightly subtherapeutic  Heparin level = 0.69 (upper end of therapeutic range) on 900 units/hr  No reported bleeding  CBC: hgb relatively stable, pltc WNL  Ortho has evaluated shoulder, no plans for surgery at this time, TRH resuming warfarin  Drug-drug interactions with warfarin: none  Diet: heart healthy                  Goal of Therapy:  INR = 2-3 Heparin level 0.3-0.7 units/ml Monitor platelets by anticoagulation protocol: Yes    Plan:  1. Reduce Heparin gtt to 800 units/hr as heparin level at upper end goal this am 2. Resume warfarin this morning, warfarin 1.25mg  as INR has increased from  admission INR 3. Daily heparin level, INR and CBC 4. Follow-up heparin d/c since INR 1.97 and warfarin being resumed today     - BRIDGE trial in NEJM (Aug 2015) suggests that bridge therapy (used LMWH in trial) for afib may not be needed while holding warfarin for procedure/surgery.  No increase risk of thromboembolism but possible increase risk of bleeding was seen in the study.   Doreene Eland, PharmD, BCPS.   Pager: [phone] 10/11/2014  8:15 AM

## 2014-10-11 NOTE — Evaluation (Addendum)
Occupational Therapy Evaluation Patient Details Name: Madeline Booth MRN: [id number] DOB: 1920/05/09 Today's Date: 10/11/2014    History of Present Illness Pt is s/p fall and sustained R humeral neck fracture on nondominant side.    Clinical Impression   Pt with onset of nausea and 5/10 with activity. She requires min guard to min assist with functional transfers into bathroom with her cane and tends to veer to the R at times. She has much difficulty managing UB ADL due to restrictions of R UE being immobilized. She has no assist at home and feel she will need SNF at d/c but pt refuses. If pt decides to go home, recommend [place] and aide. Strongly encouraged pt to consider SNF.     Follow Up Recommendations  SNF;Supervision/Assistance - 24 hour    Equipment Recommendations  None recommended by OT    Recommendations for Other Services       Precautions / Restrictions Precautions Precautions: Fall;Shoulder Shoulder Interventions: Shoulder sling/immobilizer Restrictions Weight Bearing Restrictions: Yes RUE Weight Bearing: Non weight bearing      Mobility Bed Mobility               General bed mobility comments: OOB in chair.  Transfers Overall transfer level: Needs assistance Equipment used: Straight cane Transfers: Sit to/from Stand           General transfer comment: min guard verbal cues to push on armrest of chair.     Balance                                            ADL Overall ADL's : Needs assistance/impaired Eating/Feeding: Independent;Sitting   Grooming: Wash/dry hands;Set up;Sitting   Upper Body Bathing: Moderate assistance;Sitting   Lower Body Bathing: Minimal assistance;Sit to/from stand   Upper Body Dressing : Moderate assistance;Sitting Upper Body Dressing Details (indicate cue type and reason): including sling Lower Body Dressing: Moderate assistance;Sit to/from stand   Toilet Transfer: Min guard;Minimal  assistance;Ambulation Toilet Transfer Details (indicate cue type and reason): single point cane Toileting- Clothing Manipulation and Hygiene: Min guard;Sit to/from stand         General ADL Comments: Pt's sling too far back on her arm, so allowed pt to see if she could redon her sling to better positoning but pt having difficulty properly positoning sling and adjusting straps herself. She started to have 5/10 pain with attempting task and nausea started also. Informed nursing. Pt up to 3in1 with use of cane and pt min to min guard assist. She veered R at times and slightly bumped into barthroom door on her right.  She had difficulty doffing socks and donniing but states these socks are different than what she is used to. She stated she can better stand and don socks and explained that is not a safe technique right now due to her decreaed balance and falls risk. Pt still feeling woozy when up and moving to the bathroom and also back out. She states she is "off" with her balance. She declines SNF despite encouragement to consider this option as she is alone. Educated pt on positioning of R UE on pillow for support in sling and proper positioning of sling for support. Assisted pt to reposition R UE in sling after pt attempted to do so herself but was unable to fully complete task.      Vision  Perception     Praxis      Pertinent Vitals/Pain Pain Assessment: 0-10 Pain Score: 5  Pain Location: R shoulder Pain Descriptors / Indicators: Aching Pain Intervention(s): Repositioned;Patient requesting pain meds-RN notified     Hand Dominance Left   Extremity/Trunk Assessment Upper Extremity Assessment Upper Extremity Assessment: RUE deficits/detail RUE Deficits / Details: able to move digits, wrist. UE in sling. RUE: Unable to fully assess due to immobilization           Communication Communication Communication: No difficulties   Cognition Arousal/Alertness:  Awake/alert Behavior During Therapy: WFL for tasks assessed/performed Overall Cognitive Status: Within Functional Limits for tasks assessed                     General Comments       Exercises       Shoulder Instructions      Home Living Family/patient expects to be discharged to:: Private residence Living Arrangements: Alone   Type of Home: House Home Access: Stairs to enter [employer] of Steps: 4 Entrance Stairs-Rails: Right Home Layout: One level     Bathroom Shower/Tub: Teacher, early years/pre: [place]: Bedside commode;Cane - single point;Shower seat;Grab bars - tub/shower          Prior Functioning/Environment Level of Independence: Independent with assistive device(s)             OT Diagnosis: Generalized weakness;Acute pain   OT Problem List: Decreased strength;Decreased knowledge of use of DME or AE;Decreased safety awareness;Pain   OT Treatment/Interventions: Self-care/ADL training;Patient/family education;Therapeutic activities;DME and/or AE instruction    OT Goals(Current goals can be found in the care plan section) Acute Rehab OT Goals Patient Stated Goal: home OT Goal Formulation: With patient Time For Goal Achievement: 10/25/14 Potential to Achieve Goals: Good  OT Frequency: Min 2X/week   Barriers to D/C:            Co-evaluation              End of Session Equipment Utilized During Treatment: Gait belt;Other (comment) (cane)  Activity Tolerance: Patient limited by pain;Other (comment) (nausea) Patient left: in chair;with call bell/phone within reach;with family/visitor present   Time: 1440-1507 OT Time Calculation (min): 27 min Charges:  OT General Charges $OT Visit: 1 Procedure OT Evaluation $Initial OT Evaluation Tier I: 1 Procedure OT Treatments $Self Care/Home Management : 8-22 mins G-Codes:    Jules Schick  [phone] 10/11/2014, 3:26 PM

## 2014-10-12 DIAGNOSIS — I481 Persistent atrial fibrillation: Secondary | ICD-10-CM

## 2014-10-12 DIAGNOSIS — R600 Localized edema: Secondary | ICD-10-CM

## 2014-10-12 LAB — PROTIME-INR
INR: 1.76 — AB (ref 0.00–1.49)
Prothrombin Time: 20.7 seconds — ABNORMAL HIGH (ref 11.6–15.2)

## 2014-10-12 LAB — CBC
HCT: 33.5 % — ABNORMAL LOW (ref 36.0–46.0)
Hemoglobin: 10.8 g/dL — ABNORMAL LOW (ref 12.0–15.0)
MCH: 30.2 pg (ref 26.0–34.0)
MCHC: 32.2 g/dL (ref 30.0–36.0)
MCV: 93.6 fL (ref 78.0–100.0)
Platelets: 170 10*3/uL (ref 150–400)
RBC: 3.58 MIL/uL — ABNORMAL LOW (ref 3.87–5.11)
RDW: 15.1 % (ref 11.5–15.5)
WBC: 8.7 10*3/uL (ref 4.0–10.5)

## 2014-10-12 LAB — GLUCOSE, CAPILLARY: Glucose-Capillary: 107 mg/dL — ABNORMAL HIGH (ref 70–99)

## 2014-10-12 MED ORDER — FUROSEMIDE 40 MG PO TABS
40.0000 mg | ORAL_TABLET | Freq: Once | ORAL | Status: AC
  Administered 2014-10-12: 40 mg via ORAL
  Filled 2014-10-12: qty 1

## 2014-10-12 MED ORDER — WARFARIN SODIUM 2 MG PO TABS
2.0000 mg | ORAL_TABLET | Freq: Once | ORAL | Status: AC
  Administered 2014-10-12: 2 mg via ORAL
  Filled 2014-10-12: qty 1

## 2014-10-12 NOTE — Progress Notes (Addendum)
Clinical Social Work Department CLINICAL SOCIAL WORK PLACEMENT NOTE 10/12/2014  Patient:  Madeline Booth, Madeline Booth  Account Number:  [id number] Admit date:  10/09/2014  Clinical Social Worker:  Maryln Manuel  Date/time:  10/12/2014 03:55 PM  Clinical Social Work is seeking post-discharge placement for this patient at the following level of care:   SKILLED NURSING   (*CSW will update this form in Epic as items are completed)   10/12/2014  Patient/family provided with [place] Department of Clinical Social Work's list of facilities offering this level of care within the geographic area requested by the patient (or if unable, by the patient's family).  10/12/2014  Patient/family informed of their freedom to choose among providers that offer the needed level of care, that participate in Medicare, Medicaid or managed care program needed by the patient, have an available bed and are willing to accept the patient.  10/12/2014  Patient/family informed of [facility]' ownership interest in [facility], as well as of the fact that they are under no obligation to receive care at this facility.  PASARR submitted to EDS on 10/12/2014 PASARR number received on 10/12/2014  FL2 transmitted to all facilities in geographic area requested by pt/family on  10/12/2014 FL2 transmitted to all facilities within larger geographic area on   Patient informed that his/her managed care company has contracts with or will negotiate with  certain facilities, including the following:     Patient/family informed of bed offers received:  10/13/14 Patient chooses bed at [facility] Physician recommends and patient chooses bed at    Patient to be transferred to [facility] on  10/13/14 Patient to be transferred to facility by [facility] Patient and family notified of transfer on 10/13/14 Name of family member notified:  Patient reports she will inform friends and family  The following physician request were entered  in Epic:   Additional Comments:   Alison Murray, MSW, [place] Social Work Weekend coverage [phone]

## 2014-10-12 NOTE — Progress Notes (Signed)
Subjective:   Patient is a 81 year old female with a humeral neck fracture on right nondominant side. She is resting comfortably this morning. She states that her pain is improving. She is agreeable to go to SNF at [facility] where she has been before.   Activity level:  Must wear sling at all times on the right. Diet tolerance:  Eating well Voiding:  ok Patient reports pain as mild.    Objective: Vital signs in last 24 hours: Temp:  [97.9 F (36.6 C)-99.1 F (37.3 C)] 97.9 F (36.6 C) (01/31 0500) Pulse Rate:  [82-95] 94 (01/31 0500) Resp:  [16-20] 20 (01/31 0500) BP: (125-130)/(61-80) 125/68 mmHg (01/31 0500) SpO2:  [95 %-98 %] 95 % (01/31 0500)  Labs:  Recent Labs  10/10/14 0123 10/10/14 0629 10/11/14 0532 10/12/14 0529  HGB 11.9* 11.5* 10.8* 10.8*    Recent Labs  10/11/14 0532 10/12/14 0529  WBC 7.7 8.7  RBC 3.54* 3.58*  HCT 32.8* 33.5*  PLT 171 170    Recent Labs  10/10/14 0123 10/10/14 0629  NA 138 138  K 3.8 4.1  CL 104 102  CO2 22 27  BUN 17 16  CREATININE 0.67 0.66  GLUCOSE 123* 112*  CALCIUM 8.5 8.4    Recent Labs  10/11/14 0817 10/12/14 0529  INR 1.97* 1.76*    Physical Exam:  Neurologically intact ABD soft Neurovascular intact Sensation intact distally Intact pulses distally No cellulitis present Compartment soft  Assessment/Plan: Humeral neck fracture on right nondominant side     Up with therapy Discharge to SNF [facility] when bed available and cleared by the medical team. We will see her in the office this Wednesday for repeat evaluation. Continue current pain meds. Continue non-surgical treatment at this time. We greatly appreciate medical teams management.     Sharonda Llamas, Larwance Sachs 10/12/2014, 11:08 AM

## 2014-10-12 NOTE — Progress Notes (Signed)
ANTICOAGULATION CONSULT NOTE - Follow Up  Pharmacy Consult for IV heparin/warfarin Indication: atrial fibrillation   No Known Allergies  Patient Measurements: Height: 5\' 4"  (162.6 cm) Weight: 161 lb 6 oz (73.2 kg) IBW/kg (Calculated) : 54.7   Vital Signs: Temp: 97.9 F (36.6 C) (01/31 0500) Temp Source: Oral (01/31 0500) BP: 125/68 mmHg (01/31 0500) Pulse Rate: 94 (01/31 0500)  Labs:  Recent Labs  10/10/14 0123 10/10/14 0629 10/10/14 1415 10/10/14 2212 10/11/14 0532 10/11/14 0817 10/12/14 0529  HGB 11.9* 11.5*  --   --  10.8*  --  10.8*  HCT 36.9 35.4*  --   --  32.8*  --  33.5*  PLT 188 173  --   --  171  --  170  APTT 33  --   --   --   --   --   --   LABPROT 19.5*  --   --   --   --  22.6* 20.7*  INR 1.63*  --   --   --   --  1.97* 1.76*  HEPARINUNFRC  --   --  0.47 0.55 0.69  --   --   CREATININE 0.67 0.66  --   --   --   --   --   CKTOTAL 60  --   --   --   --   --   --   TROPONINI <0.03  --   --   --   --   --   --     Estimated Creatinine Clearance: 44 mL/min (by C-G formula based on Cr of 0.66).   Medications:  Scheduled:  . amLODipine  2.5 mg Oral QPM  . calcium-vitamin D  1 tablet Oral BID  . losartan  50 mg Oral Daily  . metoprolol succinate  50 mg Oral q morning - 10a  . multivitamin with minerals  1 tablet Oral q morning - 10a  . ondansetron (ZOFRAN) IV  4 mg Intravenous Once  . sodium chloride  3 mL Intravenous Q12H  . sodium chloride  3 mL Intravenous Q12H  . vitamin E  200 Units Oral QPM  . Warfarin - Pharmacist Dosing Inpatient   Does not apply q1800    Assessment: 81 y/o F on warfarin for atrial fib tripped on her back porch and fell the evening of 1/28, fracturing her R humerus.  Warfarin was held on admission in anticipation of possible surgery; orthopedic consultation is pending.  INR is subtherapeutic so IV heparin was started with pharmacy dosing assistance requested.  Home warfarin dosage is 1.25mg  daily (1/2 x 2.5mg ), last taken  1/28.   INR at admission = 1.63  Today, 10/11/2014:  Todays' INR = 1.76 - INR decreased from yesterday  No reported bleeding  CBC: hgb stable, pltc WNL  Ortho has evaluated shoulder, no plans for surgery at this time, TRH resumed warfarin 1/30  Drug-drug interactions with warfarin: none  Diet: heart healthy                  Goal of Therapy:  INR = 2-3 Heparin level 0.3-0.7 units/ml Monitor platelets by anticoagulation protocol: Yes    Plan:   Warfarin 2mg  po x 1 today as INR has decreased overnight  Daily INR  If feel bridge therapy needed, suggest lovenox 70mg  SQ q12h   Doreene Eland, PharmD, BCPS.   Pager: [phone] 10/12/2014  7:41 AM

## 2014-10-12 NOTE — Discharge Summary (Addendum)
Physician Discharge Summary  Madeline Booth LOV:[id number] DOB: 1920/08/07 DOA: 10/09/2014  PCP: [name], MD  Admit date: 10/09/2014 Discharge date: 10/13/2014  Time spent: 55 minutes  Recommendations for Outpatient Follow-up:  1. F/u INR periodically 2. Lasix to be given PRN for Pedal edema- follow daily weights and adjust lasix dose as needed  Discharge Condition: stable Diet recommendation: heart healhty  Discharge Diagnoses:  Principal Problem:   Right humeral fracture Active Problems:   HTN (hypertension)   Atrial fibrillation   Warfarin anticoagulation   Leg edema Diastolic CHF- acute on chronic  History of present illness:  Madeline Booth is a 81 y.o. female with past medical history of hypertension, A. fib on Coumadin, breast cancer (status post right mastectomy), who presents with right arm pain after fall.   Hospital Course:  Principal Problem:  Right humeral fracture - in sling- ortho ([facility]) stating that no surgery required at this time but she must wear the sling at all times -  they will f/u as outpt this coming Wed - pain control  Active Problems:  HTN (hypertension) - cont Metoprolol and Losartan   Atrial fibrillation - rate controlled- hold Coumadin- CHA2DS2-VASc Score is 4 - cont Coumadin   Leg edema - has been going on for a few wks now- - ECHO performed n 12/28 in Dr Thurman Coyer office- I have spoken with Dr Wynonia Lawman today and the report is as follows: EF 55%, Mod L atrial enlargement, Mild MR, Mod Pulm HTN and Mod to severe AS, Mod LVH - he recommends starting Lasix 40 mg  - She was given a dose yesterday and has diuresed well- will change Lasix to 40 mg PRN daily- please follow fluid status carefully  Procedures:  none  Consultations:  ortho  Discharge Exam: Filed Weights   10/11/14 0855 10/12/14 1632 10/13/14 0628  Weight: 73.2 kg (161 lb 6 oz) 71.8 kg (158 lb 4.6 oz) 72.8 kg (160 lb 7.9 oz)   Filed Vitals:    10/13/14 0628  BP: 118/63  Pulse: 77  Temp: 98.6 F (37 C)  Resp: 18    General: AAO x 3, no distress Cardiovascular: RRR, no murmurs  Respiratory: clear to auscultation bilaterally GI: soft, non-tender, non-distended, bowel sound positive  Discharge Instructions You were cared for by a hospitalist during your hospital stay. If you have any questions about your discharge medications or the care you received while you were in the hospital after you are discharged, you can call the unit and asked to speak with the hospitalist on call if the hospitalist that took care of you is not available. Once you are discharged, your primary care physician will handle any further medical issues. Please note that NO REFILLS for any discharge medications will be authorized once you are discharged, as it is imperative that you return to your primary care physician (or establish a relationship with a primary care physician if you do not have one) for your aftercare needs so that they can reassess your need for medications and monitor your lab values.     Medication List    TAKE these medications        acetaminophen 325 MG tablet  Commonly known as:  TYLENOL  Take 2 tablets (650 mg total) by mouth every 4 (four) hours as needed for headache or mild pain.     alendronate 70 MG tablet  Commonly known as:  FOSAMAX  Take 70 mg by mouth every Sunday. Take with a  full glass of water on an empty stomach.     amLODipine 5 MG tablet  Commonly known as:  NORVASC  Take 2.5 mg by mouth every evening.     CALTRATE 600 PLUS-VIT D PO  Take 1 tablet by mouth 2 (two) times daily.     CENTRUM SILVER tablet  Take 1 tablet by mouth every morning.     furosemide 20 MG tablet  Commonly known as:  LASIX  Take 2 tablets (40 mg total) by mouth daily as needed for edema (for pedal edema).     losartan 100 MG tablet  Commonly known as:  COZAAR  Take 50 mg by mouth daily.     MAXIMUM D3 10000 UNITS Caps  Generic  drug:  Cholecalciferol  Take 10,000 Units by mouth every Wednesday.     metoprolol succinate 100 MG 24 hr tablet  Commonly known as:  TOPROL-XL  Take 50 mg by mouth daily. Take with or immediately following a meal.     senna 8.6 MG Tabs tablet  Commonly known as:  SENOKOT  Take 1 tablet (8.6 mg total) by mouth 2 (two) times daily.     vitamin E 200 UNIT capsule  Take 200 Units by mouth every evening.     warfarin 2.5 MG tablet  Commonly known as:  COUMADIN  Take 1.25 mg by mouth every morning.     warfarin 2 MG tablet  Commonly known as:  COUMADIN  Take 0.5 tablets (1 mg total) by mouth daily.       No Known Allergies Follow-up Information    Follow up with Hessie Dibble, MD. Schedule an appointment as soon as possible for a visit on 10/15/2014.   Specialty:  Orthopedic Surgery   Contact information:   [place] [address] [phone]        The results of significant diagnostics from this hospitalization (including imaging, microbiology, ancillary and laboratory) are listed below for reference.    Significant Diagnostic Studies: Dg Chest 1 View  10/10/2014   CLINICAL DATA:  Status post fall in driveway, with right arm deformity and pain. Concern for chest injury. Initial encounter.  EXAM: CHEST - 1 VIEW  COMPARISON:  CTA of the chest performed 09/16/2014, and chest radiograph performed 09/15/2014  FINDINGS: The lungs are well-aerated. Mild left perihilar airspace opacity could reflect mild pneumonia or mild pulmonary edema. A small left pleural effusion is seen. The right lung appears relatively clear. There is no evidence of pneumothorax.  The cardiomediastinal silhouette is borderline normal in size. No acute osseous abnormalities are seen. Clips are seen overlying the right axilla.  IMPRESSION: Mild left perihilar airspace opacity could reflect mild pneumonia or mild pulmonary edema. Small left pleural effusion seen. No displaced rib fractures identified.    Electronically Signed   By: Garald Balding M.D.   On: 10/10/2014 02:52   Dg Chest 2 View  09/15/2014   CLINICAL DATA:  Chest heaviness and nausea. Tachycardia and atrial fibrillation.  EXAM: CHEST - 2 VIEW  COMPARISON:  07/13/2013  FINDINGS: The heart size and mediastinal contours are within normal limits. Stable scarring in the left mid lung. There is new subsegmental atelectasis at the left lung base. There is no evidence of pulmonary edema, focal consolidation, pneumothorax, nodule or pleural fluid. The spine shows stable osteopenia and spondylosis. Clips are present in the right axilla likely related to prior breast surgery.  IMPRESSION: Atelectasis at the left lung base.  Stable left lung  scarring.   Electronically Signed   By: Aletta Edouard M.D.   On: 09/15/2014 09:00   Dg Pelvis 1-2 Views  10/10/2014   CLINICAL DATA:  Status post fall in driveway, with concern for pelvic injury. Initial encounter.  EXAM: PELVIS - 1-2 VIEW  COMPARISON:  Pelvis radiograph performed 09/21/2011, and CT of the pelvis from 03/17/2014  FINDINGS: There is no evidence of acute fracture. The patient is status post placement of intramedullary rod and screw in the proximal right femur. There appears to be mild loosening about the intramedullary rod. There is chronic deformity involving the pubic rami bilaterally.  Both femoral heads remain seated within their respective acetabuli. The sacroiliac joints demonstrate mild sclerotic change. Mild degenerative change is noted at the lower lumbar spine. The visualized bowel gas pattern is grossly unremarkable. Diffuse vascular calcifications are seen.  IMPRESSION: 1. No evidence of acute fracture. 2. Apparent mild loosening about the intramedullary rod within the proximal right femur, though visualized portions of the hardware appear intact. 3. Diffuse vascular calcifications along the femoral regions bilaterally.   Electronically Signed   By: Garald Balding M.D.   On: 10/10/2014 02:49    Dg Shoulder Right  10/10/2014   CLINICAL DATA:  Status post fall in driveway, with obvious deformity at the right shoulder. Initial encounter.  EXAM: RIGHT SHOULDER - 2+ VIEW  COMPARISON:  None.  FINDINGS: There is a displaced fracture through the right humeral neck, with approximately 1 shaft width medial and anterior displacement, and shortening at the fracture site, and significant lateral rotation of the right humeral head. Underlying mild degenerative change is noted at the right glenohumeral joint, with osteophyte formation about the humeral head.  The right humeral head is seated within the glenoid fossa. Mild degenerative change is noted at the right acromioclavicular joint. Scattered clips are seen overlying the right axilla. No significant soft tissue abnormalities are seen. The right lung appears clear.  IMPRESSION: Displaced fracture through the right humeral neck, with approximately 1 shaft width medial and anterior displacement, and shortening at the fracture site, and significant lateral rotation of the humeral head. Underlying mild degenerative change at the right glenohumeral joint.   Electronically Signed   By: Garald Balding M.D.   On: 10/10/2014 02:37   Ct Head Wo Contrast  10/10/2014   CLINICAL DATA:  Patient was walking up stairs and fell onto concrete. Laceration above the right eyebrow. No loss of consciousness. Coumadin therapy.  EXAM: CT HEAD WITHOUT CONTRAST  CT CERVICAL SPINE WITHOUT CONTRAST  TECHNIQUE: Multidetector CT imaging of the head and cervical spine was performed following the standard protocol without intravenous contrast. Multiplanar CT image reconstructions of the cervical spine were also generated.  COMPARISON:  03/27/2014  FINDINGS: CT HEAD FINDINGS  Diffuse cerebral atrophy. Ventricular dilatation consistent with central atrophy. Low-attenuation changes in the deep white matter consistent with small vessel ischemia. No mass effect or midline shift. No abnormal  extra-axial fluid collections. Gray-white matter junctions are distinct. Basal cisterns are not effaced. No evidence of acute intracranial hemorrhage. No depressed skull fractures. Vascular calcifications. Small air-fluid levels in the sphenoid sinus. Visualized paranasal sinuses and mastoid air cells are otherwise clear.  CT CERVICAL SPINE FINDINGS  Diffuse bone demineralization. Diffuse degenerative change throughout the cervical spine with narrowed cervical interspaces and endplate hypertrophic changes. Degenerative changes also seen at C1 to in throughout the facet joints. There is mild anterior subluxation of C4 on C5 which is unchanged since the previous study  and is likely degenerative. Slight cortical irregularity at the base of the odontoid process is unchanged since prior study and likely represents old lesion or degenerative change. No vertebral compression deformities. No prevertebral soft tissue swelling. C1-2 articulation appears intact. No focal bone lesion or bone destruction. Bone cortex and trabecular architecture appear intact. Soft tissues are unremarkable. A large pleural effusion demonstrated on the left apex. Azygos lobe.  IMPRESSION: No acute intracranial abnormalities. Chronic atrophy and small vessel ischemic changes.  Diffuse degenerative change throughout the cervical spine. No change in alignment since prior studies. No displaced fractures identified. Large left pleural effusion is incidentally noted.   Electronically Signed   By: Lucienne Capers M.D.   On: 10/10/2014 02:07   Ct Angio Chest Pe W/cm &/or Wo Cm  09/16/2014   CLINICAL DATA:  Pleuritic chest pain question pulmonary embolism, history hypertension, breast cancer, former smoker  EXAM: CT ANGIOGRAPHY CHEST WITH CONTRAST  TECHNIQUE: Multidetector CT imaging of the chest was performed using the standard protocol during bolus administration of intravenous contrast. Multiplanar CT image reconstructions and MIPs were obtained to  evaluate the vascular anatomy.  CONTRAST:  46mL OMNIPAQUE IOHEXOL 350 MG/ML SOLN  COMPARISON:  09/15/2014 and 07/13/2013 chest radiographs  FINDINGS: Scattered atherosclerotic calcifications aorta and coronary arteries.  Small pericardial effusion.  Aorta normal caliber without aneurysmal dilatation.  Inadequate aortic enhancement to assess for dissection.  Pulmonary arteries well opacified and patent.  No evidence of pulmonary embolism.  No thoracic adenopathy.  Azygos fissure noted.  Visualized upper abdomen unremarkable for technique.  Small BILATERAL pleural effusions and adjacent atelectasis of the lower lobes, greater on LEFT.  Central peribronchial thickening.  No infiltrate or pneumothorax.  Diffuse osseous demineralization with RIGHT glenohumeral degenerative changes.  Old appearing manubrial fracture.  Diffuse osseous demineralization with scattered degenerative disc and facet disease changes thoracic spine.  Mild diffuse sclerosis of the T7 vertebra with mild superior endplate height loss.  Chronic compression fracture of T10 vertebra.  Review of the MIP images confirms the above findings.  IMPRESSION: No evidence of pulmonary embolism.  Extensive atherosclerotic disease.  BILATERAL pleural effusions and atelectasis greater on LEFT.  Old manubrial and T10 fractures.  Osseous demineralization.  Minimal height loss and mild sclerosis in T7 vertebra, could be related to mild compression fracture though underlying sclerotic process such as metastatic disease not entirely excluded.  If patient has prior outside exams that can help assess stability, recommend comparison.  In the absence of prior studies, consider MR follow-up.   Electronically Signed   By: Lavonia Dana M.D.   On: 09/16/2014 12:24   Ct Cervical Spine Wo Contrast  10/10/2014   CLINICAL DATA:  Patient was walking up stairs and fell onto concrete. Laceration above the right eyebrow. No loss of consciousness. Coumadin therapy.  EXAM: CT HEAD  WITHOUT CONTRAST  CT CERVICAL SPINE WITHOUT CONTRAST  TECHNIQUE: Multidetector CT imaging of the head and cervical spine was performed following the standard protocol without intravenous contrast. Multiplanar CT image reconstructions of the cervical spine were also generated.  COMPARISON:  03/27/2014  FINDINGS: CT HEAD FINDINGS  Diffuse cerebral atrophy. Ventricular dilatation consistent with central atrophy. Low-attenuation changes in the deep white matter consistent with small vessel ischemia. No mass effect or midline shift. No abnormal extra-axial fluid collections. Gray-white matter junctions are distinct. Basal cisterns are not effaced. No evidence of acute intracranial hemorrhage. No depressed skull fractures. Vascular calcifications. Small air-fluid levels in the sphenoid sinus. Visualized paranasal sinuses and  mastoid air cells are otherwise clear.  CT CERVICAL SPINE FINDINGS  Diffuse bone demineralization. Diffuse degenerative change throughout the cervical spine with narrowed cervical interspaces and endplate hypertrophic changes. Degenerative changes also seen at C1 to in throughout the facet joints. There is mild anterior subluxation of C4 on C5 which is unchanged since the previous study and is likely degenerative. Slight cortical irregularity at the base of the odontoid process is unchanged since prior study and likely represents old lesion or degenerative change. No vertebral compression deformities. No prevertebral soft tissue swelling. C1-2 articulation appears intact. No focal bone lesion or bone destruction. Bone cortex and trabecular architecture appear intact. Soft tissues are unremarkable. A large pleural effusion demonstrated on the left apex. Azygos lobe.  IMPRESSION: No acute intracranial abnormalities. Chronic atrophy and small vessel ischemic changes.  Diffuse degenerative change throughout the cervical spine. No change in alignment since prior studies. No displaced fractures identified.  Large left pleural effusion is incidentally noted.   Electronically Signed   By: Lucienne Capers M.D.   On: 10/10/2014 02:07   Dg Humerus Right  10/10/2014   CLINICAL DATA:  Status post fall in driveway, with obvious deformity at the right shoulder, and pain radiating down the right arm. Initial encounter.  EXAM: RIGHT HUMERUS - 2+ VIEW  COMPARISON:  None.  FINDINGS: There is a significantly displaced fracture at the right humeral neck, with 1 shaft width medial and anterior displacement of the distal humerus, and significant lateral rotation of the humeral head.  No additional fractures are seen. The distal humerus appears intact. The elbow joint is grossly unremarkable. Mild degenerative change is noted at the right acromioclavicular joint.  IMPRESSION: Significantly displaced fracture of the right humeral neck, with 1 shaft width medial and anterior displacement of the distal humerus, and significant lateral rotation of the humeral head.   Electronically Signed   By: Garald Balding M.D.   On: 10/10/2014 02:45    Microbiology: No results found for this or any previous visit (from the past 240 hour(s)).   Labs: Basic Metabolic Panel:  Recent Labs Lab 10/10/14 0123 10/10/14 0629 10/13/14 0515  NA 138 138 138  K 3.8 4.1 3.3*  CL 104 102 104  CO2 22 27 29   GLUCOSE 123* 112* 93  BUN 17 16 18   CREATININE 0.67 0.66 0.70  CALCIUM 8.5 8.4 7.7*   Liver Function Tests: No results for input(s): AST, ALT, ALKPHOS, BILITOT, PROT, ALBUMIN in the last 168 hours. No results for input(s): LIPASE, AMYLASE in the last 168 hours. No results for input(s): AMMONIA in the last 168 hours. CBC:  Recent Labs Lab 10/10/14 0123 10/10/14 0629 10/11/14 0532 10/12/14 0529  WBC 9.4 9.1 7.7 8.7  NEUTROABS 7.8*  --   --   --   HGB 11.9* 11.5* 10.8* 10.8*  HCT 36.9 35.4* 32.8* 33.5*  MCV 93.4 92.9 92.7 93.6  PLT 188 173 171 170   Cardiac Enzymes:  Recent Labs Lab 10/10/14 0123  CKTOTAL 60   TROPONINI <0.03   BNP: BNP (last 3 results) No results for input(s): PROBNP in the last 8760 hours. CBG:  Recent Labs Lab 10/10/14 0757 10/11/14 0723 10/12/14 0727 10/13/14 0732  GLUCAP 95 118* 107* 101*       SignedDebbe Odea, MD [facility] 10/13/2014, 9:38 AM

## 2014-10-12 NOTE — Progress Notes (Signed)
CSW noted CSW consult for New SNF.  CSW reviewed chart and noted weekday CSW met with pt and pt declined SNF at that time.   CSW contacted RN to clarify. Pt RN states that pt now agreeable to SNF for rehab. Per RN, PT evaluated pt late yesterday and pt realized that she would not be able to manage at home. Per RN, pt interested in [facility], but wants private room wherever she goes. CSW discussed with RN that CSW will initiate search in order for pt to transition to SNF on Monday once insurance auth received.   CSW completed FL2 and initiated SNF search to [facility]. CSW notified Ritta Slot of pt interest in facility. Pt insurance [employer] PPO requires insurance authorization prior to pt d/c to SNF.   Weekday CSW to f/u with pt re: SNF bed offers in order to facilitate pt d/c needs.  Alison Murray, MSW, LCSW Clinical Social Work Weekend CSW [phone]

## 2014-10-12 NOTE — Progress Notes (Signed)
[facility] Progress Note   Madeline Booth GDJ:[id number] DOB: 10-30-1919 DOA: 10/09/2014 PCP: [name], MD  Brief narrative: Madeline Booth is a 81 y.o. female with past medical history of hypertension, A. fib on Coumadin, breast cancer (status post right mastectomy), who presents with right arm pain after fall.   Subjective: No complaints. Discussed ECHO findings with patient and starting Lasix.   Assessment/Plan: Principal Problem:   Right humeral fracture - in sling- ortho stating that no surgery required at this time- they will f/u as outpt - pain control  Active Problems:   HTN (hypertension) - cont Metoprolol and Losartan    Atrial fibrillation - rate controlled- hold Coumadin- CHA2DS2-VASc Score is 4-started Heparin for bridging- INR was 1.63    Leg edema - has been going on for a few wks now- - ECHO performed n 12/28 in Dr Thurman Coyer office- I have spoken with Dr Wynonia Lawman today and the report is as follows: EF 55%, Mod L atrial enlargement, Mild MR, Mod Pulm HTN and Mod to severe AS, Mod LVH - he recommends starting Lasix 40 mg daily - check Bmet in AM    Code Status: Full code Family Communication:  Disposition Plan: SNF per PT eval DVT prophylaxis: Coumadin Consultants: ortho  Procedures:   Antibiotics: Anti-infectives    None     Objective: Filed Weights   10/10/14 0500 10/11/14 0855  Weight: 74.9 kg (165 lb 2 oz) 73.2 kg (161 lb 6 oz)    Intake/Output Summary (Last 24 hours) at 10/12/14 1053 Last data filed at 10/11/14 1900  Gross per 24 hour  Intake    266 ml  Output    325 ml  Net    -59 ml     Vitals Filed Vitals:   10/11/14 1500 10/11/14 2148 10/12/14 0206 10/12/14 0500  BP: 128/61 130/70 130/80 125/68  Pulse: 95 82 88 94  Temp: 98.4 F (36.9 C) 98.4 F (36.9 C) 98.2 F (36.8 C) 97.9 F (36.6 C)  TempSrc: Oral Oral Oral Oral  Resp: 16 20 20 20   Height:      Weight:      SpO2: 97% 98% 98% 95%     Exam: General: AAO x3, No acute respiratory distress- right arm in sling Lungs: Clear to auscultation bilaterally without wheezes or crackles Cardiovascular: Regular rate and rhythm without murmur gallop or rub normal S1 and S2 Abdomen: Nontender, nondistended, soft, bowel sounds positive, no rebound, no ascites, no appreciable mass Extremities: No significant cyanosis, clubbing- + 2 edema bilateral lower extremities  Data Reviewed: Basic Metabolic Panel:  Recent Labs Lab 10/10/14 0123 10/10/14 0629  NA 138 138  K 3.8 4.1  CL 104 102  CO2 22 27  GLUCOSE 123* 112*  BUN 17 16  CREATININE 0.67 0.66  CALCIUM 8.5 8.4   Liver Function Tests: No results for input(s): AST, ALT, ALKPHOS, BILITOT, PROT, ALBUMIN in the last 168 hours. No results for input(s): LIPASE, AMYLASE in the last 168 hours. No results for input(s): AMMONIA in the last 168 hours. CBC:  Recent Labs Lab 10/10/14 0123 10/10/14 0629 10/11/14 0532 10/12/14 0529  WBC 9.4 9.1 7.7 8.7  NEUTROABS 7.8*  --   --   --   HGB 11.9* 11.5* 10.8* 10.8*  HCT 36.9 35.4* 32.8* 33.5*  MCV 93.4 92.9 92.7 93.6  PLT 188 173 171 170   Cardiac Enzymes:  Recent Labs Lab 10/10/14 0123  CKTOTAL 60  TROPONINI <0.03   BNP (last  3 results) No results for input(s): PROBNP in the last 8760 hours. CBG:  Recent Labs Lab 10/10/14 0757 10/11/14 0723 10/12/14 0727  GLUCAP 95 118* 107*    No results found for this or any previous visit (from the past 240 hour(s)).   Studies:  Recent x-ray studies have been reviewed in detail by the Attending Physician  Scheduled Meds:  Scheduled Meds: . amLODipine  2.5 mg Oral QPM  . calcium-vitamin D  1 tablet Oral BID  . losartan  50 mg Oral Daily  . metoprolol succinate  50 mg Oral q morning - 10a  . multivitamin with minerals  1 tablet Oral q morning - 10a  . ondansetron (ZOFRAN) IV  4 mg Intravenous Once  . sodium chloride  3 mL Intravenous Q12H  . sodium chloride  3 mL  Intravenous Q12H  . vitamin E  200 Units Oral QPM  . warfarin  2 mg Oral ONCE-1800  . Warfarin - Pharmacist Dosing Inpatient   Does not apply q1800   Continuous Infusions:    Time spent on care of this patient: 35 min   [place], MD 10/12/2014, 10:53 AM  LOS: 3 days   [facility] Office  [phone] Pager - Text Page per [email or web address]  If 7PM-7AM, please contact night-coverage [email or web address]

## 2014-10-13 DIAGNOSIS — R609 Edema, unspecified: Secondary | ICD-10-CM

## 2014-10-13 DIAGNOSIS — I5031 Acute diastolic (congestive) heart failure: Secondary | ICD-10-CM

## 2014-10-13 LAB — BASIC METABOLIC PANEL
Anion gap: 5 (ref 5–15)
BUN: 18 mg/dL (ref 6–23)
CHLORIDE: 104 mmol/L (ref 96–112)
CO2: 29 mmol/L (ref 19–32)
Calcium: 7.7 mg/dL — ABNORMAL LOW (ref 8.4–10.5)
Creatinine, Ser: 0.7 mg/dL (ref 0.50–1.10)
GFR calc Af Amer: 85 mL/min — ABNORMAL LOW (ref >90)
GFR, EST NON AFRICAN AMERICAN: 73 mL/min — AB (ref >90)
Glucose, Bld: 93 mg/dL (ref 70–99)
POTASSIUM: 3.3 mmol/L — AB (ref 3.5–5.1)
SODIUM: 138 mmol/L (ref 135–145)

## 2014-10-13 LAB — PROTIME-INR
INR: 1.9 — AB (ref 0.00–1.49)
PROTHROMBIN TIME: 22 s — AB (ref 11.6–15.2)

## 2014-10-13 LAB — GLUCOSE, CAPILLARY: GLUCOSE-CAPILLARY: 101 mg/dL — AB (ref 70–99)

## 2014-10-13 MED ORDER — WARFARIN 1.25 MG HALF TABLET
1.2500 mg | ORAL_TABLET | Freq: Once | ORAL | Status: DC
  Filled 2014-10-13: qty 1

## 2014-10-13 MED ORDER — WARFARIN SODIUM 2 MG PO TABS: 1.2500 mg | ORAL_TABLET | Freq: Every day | ORAL | Status: DC

## 2014-10-13 MED ORDER — FUROSEMIDE 20 MG PO TABS: 40.0000 mg | ORAL_TABLET | Freq: Every day | ORAL | Status: DC | PRN

## 2014-10-13 NOTE — Progress Notes (Signed)
Subjective:     Patient is a 81 year old female with a humeral neck fracture on right nondominant side. She is resting comfortably this morning is is feeling more spry and is asking when she can get out of here to go to SNF.  Activity level:  Must wear sling at all times Diet tolerance:  Eating well Voiding:  ok Patient reports pain as mild.    Objective: Vital signs in last 24 hours: Temp:  [97.4 F (36.3 C)-98.6 F (37 C)] 98.6 F (37 C) (02/01 0628) Pulse Rate:  [77-86] 77 (02/01 0628) Resp:  [18-20] 18 (02/01 0628) BP: (103-131)/(63-76) 118/63 mmHg (02/01 0628) SpO2:  [96 %-99 %] 96 % (02/01 0628) Weight:  [71.8 kg (158 lb 4.6 oz)-72.8 kg (160 lb 7.9 oz)] 72.8 kg (160 lb 7.9 oz) (02/01 0628)  Labs:  Recent Labs  10/11/14 0532 10/12/14 0529  HGB 10.8* 10.8*    Recent Labs  10/11/14 0532 10/12/14 0529  WBC 7.7 8.7  RBC 3.54* 3.58*  HCT 32.8* 33.5*  PLT 171 170    Recent Labs  10/13/14 0515  NA 138  K 3.3*  CL 104  CO2 29  BUN 18  CREATININE 0.70  GLUCOSE 93  CALCIUM 7.7*    Recent Labs  10/12/14 0529 10/13/14 0515  INR 1.76* 1.90*    Physical Exam:  Neurologically intact ABD soft Neurovascular intact Sensation intact distally Intact pulses distally No cellulitis present Compartment soft  Assessment/Plan:      Advance diet Up with therapy Discharge to SNF when stable per medical team. Patient prefers [facility]. We will see her in the office this Wednesday for repeat evaluation. Continue current pain meds. Continue non-surgical treatment at this time. We greatly appreciate medical teams management.      Marlow Hendrie, Larwance Sachs 10/13/2014, 8:53 AM

## 2014-10-13 NOTE — Progress Notes (Signed)
ANTICOAGULATION CONSULT NOTE - Follow Up  Pharmacy Consult for warfarin Indication: atrial fibrillation   No Known Allergies  Patient Measurements: Height: 5\' 4"  (162.6 cm) Weight: 160 lb 7.9 oz (72.8 kg) IBW/kg (Calculated) : 54.7   Vital Signs: Temp: 98.6 F (37 C) (02/01 0628) Temp Source: Oral (02/01 0628) BP: 118/63 mmHg (02/01 0628) Pulse Rate: 77 (02/01 0628)  Labs:  Recent Labs  10/10/14 1415 10/10/14 2212 10/11/14 0532 10/11/14 0817 10/12/14 0529 10/13/14 0515  HGB  --   --  10.8*  --  10.8*  --   HCT  --   --  32.8*  --  33.5*  --   PLT  --   --  171  --  170  --   LABPROT  --   --   --  22.6* 20.7* 22.0*  INR  --   --   --  1.97* 1.76* 1.90*  HEPARINUNFRC 0.47 0.55 0.69  --   --   --   CREATININE  --   --   --   --   --  0.70    Estimated Creatinine Clearance: 43.8 mL/min (by C-G formula based on Cr of 0.7).   Medications:  Scheduled:  . amLODipine  2.5 mg Oral QPM  . calcium-vitamin D  1 tablet Oral BID  . losartan  50 mg Oral Daily  . metoprolol succinate  50 mg Oral q morning - 10a  . multivitamin with minerals  1 tablet Oral q morning - 10a  . ondansetron (ZOFRAN) IV  4 mg Intravenous Once  . sodium chloride  3 mL Intravenous Q12H  . sodium chloride  3 mL Intravenous Q12H  . vitamin E  200 Units Oral QPM  . Warfarin - Pharmacist Dosing Inpatient   Does not apply q1800    Assessment: 81 y/o F on warfarin for atrial fib tripped on her back porch and fell the evening of 1/28, fracturing her R humerus.  Warfarin was held on admission in anticipation of possible surgery; orthopedic consultation is pending.  INR is subtherapeutic so IV heparin was started with pharmacy dosing assistance requested.  Home warfarin dosage is 1.25mg  daily (1/2 x 2.5mg ), last taken 1/28.   INR at admission = 1.63  Today, 10/13/2014  INR improving  No reported bleeding  CBC from 1/31: Hgb stable, pPtc WNL  Ortho has evaluated shoulder, no plans for surgery at  this time, TRH resumed warfarin 1/30  Drug-drug interactions with warfarin: none  Diet: heart healthy                  Goal of Therapy:  INR 2-3    Plan:   Warfarin 1.25 mg PO today as per patient's usual regimen.  Daily INR  If feel bridge therapy needed, suggest Lovenox 70mg  SQ q12h    Clayburn Pert, PharmD, BCPS Pager: [phone] 10/13/2014  7:40 AM

## 2014-10-13 NOTE — Progress Notes (Signed)
Report called to [facility]. Report given to Virgia Land, LPN.  Unit number left for staff if they should have questions. Patient discharged and transported via [place].

## 2014-10-13 NOTE — Progress Notes (Signed)
Physical Therapy Treatment Patient Details Name: Madeline Booth MRN: [id number] DOB: 05-Aug-1920 Today's Date: 10/13/2014    History of Present Illness Pt is s/p fall and sustained R humeral neck fracture on nondominant side.     PT Comments    Pt assisted to bathroom and requiring min assist for standing and steadying.  Pt tolerated short distance ambulation in hallway as well.  Pt reports pain under control today and activity more limited by fatigue and slight dizziness.   Follow Up Recommendations  SNF;Supervision/Assistance - 24 hour     Equipment Recommendations  None recommended by PT    Recommendations for Other Services       Precautions / Restrictions Precautions Precautions: Fall;Shoulder Shoulder Interventions: Shoulder sling/immobilizer Restrictions Weight Bearing Restrictions: Yes RUE Weight Bearing: Non weight bearing    Mobility  Bed Mobility Overal bed mobility: Needs Assistance Bed Mobility: Supine to Sit     Supine to sit: Min assist;HOB elevated     General bed mobility comments: elevated HOB per pt request, still required assist to complete trunk upright, verbal cues for hand placement L UE to self assist  Transfers Overall transfer level: Needs assistance Equipment used: Straight cane Transfers: Sit to/from Stand Sit to Stand: Min assist         General transfer comment: cues for L hand placement, assist to rise and stabilize  Ambulation/Gait Ambulation/Gait assistance: Min assist Ambulation Distance (Feet): 40 Feet Assistive device: Straight cane Gait Pattern/deviations: Step-through pattern;Decreased stride length;Wide base of support;Trunk flexed     General Gait Details: cues for safety, assist for balance; pt reports slight dizziness intermittently and self limited distance due to dizziness and fatigue   Stairs            Wheelchair Mobility    Modified Rankin (Stroke Patients Only)       Balance            Standing balance support: Single extremity supported Standing balance-Leahy Scale: Poor                      Cognition Arousal/Alertness: Awake/alert Behavior During Therapy: WFL for tasks assessed/performed Overall Cognitive Status: Within Functional Limits for tasks assessed                      Exercises      General Comments        Pertinent Vitals/Pain Pain Assessment: 0-10 Pain Score: 1  Pain Location: R shoulder Pain Descriptors / Indicators: Aching Pain Intervention(s): Limited activity within patient's tolerance;Monitored during session;Repositioned    Home Living                      Prior Function            PT Goals (current goals can now be found in the care plan section) Progress towards PT goals: Progressing toward goals    Frequency  Min 3X/week    PT Plan Current plan remains appropriate    Co-evaluation             End of Session Equipment Utilized During Treatment: Other (comment) (sling) Activity Tolerance: Patient limited by fatigue Patient left: with call bell/phone within reach;in chair;with family/visitor present     Time: 2992-4268 PT Time Calculation (min) (ACUTE ONLY): 12 min  Charges:  $Gait Training: 8-22 mins  G CodesTrena Platt October 30, 2014, 10:42 AM Carmelia Bake, PT, DPT Oct 30, 2014 Pager: [phone]

## 2014-10-13 NOTE — Progress Notes (Signed)
Clinical Social Work  CSW faxed DC summary to [facility] who is agreeable to accept patient today and has received insurance authorization. CSW prepared DC packet with FL2, DC summary and PTAR forms included. CSW informed patient of DC plans. Patient wants PTAR to provide transportation and is aware of no guarantee of payment. Patient reports she is happy to DC today and glad that she is going to have a private room. RN to call report. CSW arranged PTAR for 1:15 pm pick up. PTAR #: [id number].  CSW is signing off but available if needed.  [place], [place] [phone]

## 2014-10-13 NOTE — Progress Notes (Signed)
Clinical Social Work  CSW received hand off from weekend that patient wants to DC to [facility]. MD reports patient is medically stable to DC today. CSW spoke with [facility] who reports that they could not take a LOG for insurance authorization but feel confident that they should be able to get authorization today but need updated PT note. CSW spoke with PT who will check with patient again to complete session. PT reports they had tried earlier this morning but patient declined at that time. CSW followed up with patient and explained the importance of working with PT for insurance approval if patient was interested in SNF. Patient reports understanding and states that she will work with PT. CSW will continue to follow.  [place], [place] [phone]

## 2014-10-15 ENCOUNTER — Encounter (HOSPITAL_COMMUNITY)
Admission: RE | Admit: 2014-10-15 | Discharge: 2014-10-15 | Disposition: A | Discharge status: Home / Self Care | Payer: Medicare PPO | Source: Ambulatory Visit | Attending: Orthopedic Surgery | Admitting: Orthopedic Surgery

## 2014-10-15 ENCOUNTER — Other Ambulatory Visit: Payer: Self-pay | Admitting: Orthopedic Surgery

## 2014-10-15 ENCOUNTER — Encounter (HOSPITAL_COMMUNITY): Payer: Self-pay

## 2014-10-15 DIAGNOSIS — Z9011 Acquired absence of right breast and nipple: Secondary | ICD-10-CM | POA: Diagnosis not present

## 2014-10-15 DIAGNOSIS — H269 Unspecified cataract: Secondary | ICD-10-CM | POA: Diagnosis not present

## 2014-10-15 DIAGNOSIS — Y929 Unspecified place or not applicable: Secondary | ICD-10-CM | POA: Diagnosis not present

## 2014-10-15 DIAGNOSIS — M810 Age-related osteoporosis without current pathological fracture: Secondary | ICD-10-CM | POA: Diagnosis not present

## 2014-10-15 DIAGNOSIS — Z87891 Personal history of nicotine dependence: Secondary | ICD-10-CM | POA: Diagnosis not present

## 2014-10-15 DIAGNOSIS — Z79899 Other long term (current) drug therapy: Secondary | ICD-10-CM | POA: Diagnosis not present

## 2014-10-15 DIAGNOSIS — S42201A Unspecified fracture of upper end of right humerus, initial encounter for closed fracture: Secondary | ICD-10-CM | POA: Diagnosis present

## 2014-10-15 DIAGNOSIS — I480 Paroxysmal atrial fibrillation: Secondary | ICD-10-CM | POA: Diagnosis not present

## 2014-10-15 DIAGNOSIS — W1830XA Fall on same level, unspecified, initial encounter: Secondary | ICD-10-CM | POA: Diagnosis not present

## 2014-10-15 DIAGNOSIS — Z7901 Long term (current) use of anticoagulants: Secondary | ICD-10-CM | POA: Diagnosis not present

## 2014-10-15 DIAGNOSIS — Z853 Personal history of malignant neoplasm of breast: Secondary | ICD-10-CM | POA: Diagnosis not present

## 2014-10-15 DIAGNOSIS — I10 Essential (primary) hypertension: Secondary | ICD-10-CM | POA: Diagnosis not present

## 2014-10-15 LAB — CBC WITH DIFFERENTIAL/PLATELET
BASOS PCT: 0 % (ref 0–1)
Basophils Absolute: 0 10*3/uL (ref 0.0–0.1)
EOS PCT: 3 % (ref 0–5)
Eosinophils Absolute: 0.2 10*3/uL (ref 0.0–0.7)
HCT: 36.7 % (ref 36.0–46.0)
HEMOGLOBIN: 12 g/dL (ref 12.0–15.0)
Lymphocytes Relative: 18 % (ref 12–46)
Lymphs Abs: 1.5 10*3/uL (ref 0.7–4.0)
MCH: 29.9 pg (ref 26.0–34.0)
MCHC: 32.7 g/dL (ref 30.0–36.0)
MCV: 91.3 fL (ref 78.0–100.0)
MONO ABS: 0.9 10*3/uL (ref 0.1–1.0)
MONOS PCT: 11 % (ref 3–12)
NEUTROS PCT: 68 % (ref 43–77)
Neutro Abs: 5.8 10*3/uL (ref 1.7–7.7)
PLATELETS: 223 10*3/uL (ref 150–400)
RBC: 4.02 MIL/uL (ref 3.87–5.11)
RDW: 14.8 % (ref 11.5–15.5)
WBC: 8.4 10*3/uL (ref 4.0–10.5)

## 2014-10-15 LAB — APTT: aPTT: 38 seconds — ABNORMAL HIGH (ref 24–37)

## 2014-10-15 LAB — COMPREHENSIVE METABOLIC PANEL
ALBUMIN: 2.7 g/dL — AB (ref 3.5–5.2)
ALK PHOS: 82 U/L (ref 39–117)
ALT: 21 U/L (ref 0–35)
AST: 29 U/L (ref 0–37)
Anion gap: 8 (ref 5–15)
BUN: 16 mg/dL (ref 6–23)
CHLORIDE: 102 mmol/L (ref 96–112)
CO2: 28 mmol/L (ref 19–32)
Calcium: 8.5 mg/dL (ref 8.4–10.5)
Creatinine, Ser: 0.78 mg/dL (ref 0.50–1.10)
GFR, EST AFRICAN AMERICAN: 82 mL/min — AB (ref >90)
GFR, EST NON AFRICAN AMERICAN: 70 mL/min — AB (ref >90)
Glucose, Bld: 91 mg/dL (ref 70–99)
Potassium: 3.5 mmol/L (ref 3.5–5.1)
Sodium: 138 mmol/L (ref 135–145)
TOTAL PROTEIN: 6.3 g/dL (ref 6.0–8.3)
Total Bilirubin: 1.1 mg/dL (ref 0.3–1.2)

## 2014-10-15 LAB — PROTIME-INR
INR: 2.14 — AB (ref 0.00–1.49)
PROTHROMBIN TIME: 24.1 s — AB (ref 11.6–15.2)

## 2014-10-15 LAB — TYPE AND SCREEN
ABO/RH(D): O POS
Antibody Screen: NEGATIVE

## 2014-10-15 LAB — ABO/RH: ABO/RH(D): O POS

## 2014-10-15 MED ORDER — CEFAZOLIN SODIUM-DEXTROSE 2-3 GM-% IV SOLR
2.0000 g | INTRAVENOUS | Status: AC
  Administered 2014-10-16: 2 g via INTRAVENOUS

## 2014-10-15 MED ORDER — POVIDONE-IODINE 7.5 % EX SOLN: Freq: Once | CUTANEOUS | Status: DC

## 2014-10-15 NOTE — Pre-Procedure Instructions (Signed)
Madeline Booth  10/15/2014   Your procedure is scheduled on:  10/16/14  Report to [facility] Admitting at 815 AM.  Call this number if you have problems the morning of surgery: [phone]   Remember:   Do not eat food or drink liquids after midnight.   Take these medicines the morning of surgery with A SIP OF WATER: tylenol,amlodipine,metoprolol   Do not wear jewelry, make-up or nail polish.  Do not wear lotions, powders, or perfumes. You may wear deodorant.  Do not shave 48 hours prior to surgery. Men may shave face and neck.  Do not bring valuables to the hospital.  [facility] is not responsible                  for any belongings or valuables.               Contacts, dentures or bridgework may not be worn into surgery.  Leave suitcase in the car. After surgery it may be brought to your room.  For patients admitted to the hospital, discharge time is determined by your                treatment team.               Patients discharged the day of surgery will not be allowed to drive  home.  Name and phone number of your driver:   Special Instructions: Shower using CHG 2 nights before surgery and the night before surgery.  If you shower the day of surgery use CHG.  Use special wash - you have one bottle of CHG for all showers.  You should use approximately 1/3 of the bottle for each shower.   Please read over the following fact sheets that you were given: Pain Booklet, Coughing and Deep Breathing and Surgical Site Infection Prevention

## 2014-10-15 NOTE — Progress Notes (Signed)
PT-INR abnormal, called Dr. Bettina Gavia office and spoke with Andee Poles, [place] for Dr. Tamera Punt and gave her results. She states she will make sure Dr. Tamera Punt is aware of this.

## 2014-10-16 ENCOUNTER — Inpatient Hospital Stay (HOSPITAL_COMMUNITY): Payer: Medicare PPO | Admitting: Certified Registered Nurse Anesthetist

## 2014-10-16 ENCOUNTER — Inpatient Hospital Stay (HOSPITAL_COMMUNITY): Payer: Medicare PPO | Admitting: Vascular Surgery

## 2014-10-16 ENCOUNTER — Observation Stay (HOSPITAL_COMMUNITY)
Admission: RE | Admit: 2014-10-16 | Discharge: 2014-10-18 | Disposition: A | Discharge status: Skilled Nursing Facility | Payer: Medicare PPO | Source: Ambulatory Visit | Attending: Orthopedic Surgery | Admitting: Orthopedic Surgery

## 2014-10-16 ENCOUNTER — Observation Stay (HOSPITAL_COMMUNITY): Payer: Medicare PPO

## 2014-10-16 ENCOUNTER — Encounter (HOSPITAL_COMMUNITY)
Admission: RE | Disposition: A | Discharge status: Skilled Nursing Facility | Payer: Self-pay | Source: Ambulatory Visit | Attending: Orthopedic Surgery

## 2014-10-16 ENCOUNTER — Encounter (HOSPITAL_COMMUNITY): Payer: Self-pay | Admitting: *Deleted

## 2014-10-16 DIAGNOSIS — I480 Paroxysmal atrial fibrillation: Secondary | ICD-10-CM | POA: Insufficient documentation

## 2014-10-16 DIAGNOSIS — Z853 Personal history of malignant neoplasm of breast: Secondary | ICD-10-CM | POA: Insufficient documentation

## 2014-10-16 DIAGNOSIS — M810 Age-related osteoporosis without current pathological fracture: Secondary | ICD-10-CM | POA: Insufficient documentation

## 2014-10-16 DIAGNOSIS — S42201A Unspecified fracture of upper end of right humerus, initial encounter for closed fracture: Principal | ICD-10-CM | POA: Insufficient documentation

## 2014-10-16 DIAGNOSIS — I10 Essential (primary) hypertension: Secondary | ICD-10-CM | POA: Insufficient documentation

## 2014-10-16 DIAGNOSIS — Z87891 Personal history of nicotine dependence: Secondary | ICD-10-CM | POA: Insufficient documentation

## 2014-10-16 DIAGNOSIS — Y929 Unspecified place or not applicable: Secondary | ICD-10-CM | POA: Insufficient documentation

## 2014-10-16 DIAGNOSIS — S42309A Unspecified fracture of shaft of humerus, unspecified arm, initial encounter for closed fracture: Secondary | ICD-10-CM

## 2014-10-16 DIAGNOSIS — Z9011 Acquired absence of right breast and nipple: Secondary | ICD-10-CM | POA: Insufficient documentation

## 2014-10-16 DIAGNOSIS — W1830XA Fall on same level, unspecified, initial encounter: Secondary | ICD-10-CM | POA: Insufficient documentation

## 2014-10-16 DIAGNOSIS — Z79899 Other long term (current) drug therapy: Secondary | ICD-10-CM | POA: Insufficient documentation

## 2014-10-16 DIAGNOSIS — Z7901 Long term (current) use of anticoagulants: Secondary | ICD-10-CM | POA: Insufficient documentation

## 2014-10-16 DIAGNOSIS — S42209A Unspecified fracture of upper end of unspecified humerus, initial encounter for closed fracture: Secondary | ICD-10-CM | POA: Diagnosis present

## 2014-10-16 DIAGNOSIS — H269 Unspecified cataract: Secondary | ICD-10-CM | POA: Insufficient documentation

## 2014-10-16 HISTORY — PX: ORIF HUMERUS FRACTURE: SHX2126

## 2014-10-16 SURGERY — OPEN REDUCTION INTERNAL FIXATION (ORIF) PROXIMAL HUMERUS FRACTURE
Anesthesia: Regional | Laterality: Right

## 2014-10-16 MED ORDER — POLYETHYLENE GLYCOL 3350 17 G PO PACK: 17.0000 g | PACK | Freq: Every day | ORAL | Status: DC | PRN

## 2014-10-16 MED ORDER — PROPOFOL 10 MG/ML IV BOLUS
INTRAVENOUS | Status: AC
  Filled 2014-10-16: qty 20

## 2014-10-16 MED ORDER — WARFARIN - PHYSICIAN DOSING INPATIENT: Freq: Every day | Status: DC

## 2014-10-16 MED ORDER — BUPIVACAINE-EPINEPHRINE (PF) 0.25% -1:200000 IJ SOLN
INTRAMUSCULAR | Status: AC
  Filled 2014-10-16: qty 30

## 2014-10-16 MED ORDER — LIDOCAINE HCL (CARDIAC) 20 MG/ML IV SOLN
INTRAVENOUS | Status: AC
  Filled 2014-10-16: qty 10

## 2014-10-16 MED ORDER — AMLODIPINE BESYLATE 2.5 MG PO TABS
2.5000 mg | ORAL_TABLET | Freq: Every evening | ORAL | Status: DC
  Administered 2014-10-16 – 2014-10-17 (×2): 2.5 mg via ORAL
  Filled 2014-10-16 (×3): qty 1

## 2014-10-16 MED ORDER — LIDOCAINE HCL 4 % MT SOLN
OROMUCOSAL | Status: DC | PRN
  Administered 2014-10-16: 4 mL via TOPICAL

## 2014-10-16 MED ORDER — PROPOFOL 10 MG/ML IV BOLUS
INTRAVENOUS | Status: DC | PRN
  Administered 2014-10-16: 100 mg via INTRAVENOUS
  Administered 2014-10-16: 20 mg via INTRAVENOUS

## 2014-10-16 MED ORDER — PHENOL 1.4 % MT LIQD: 1.0000 | OROMUCOSAL | Status: DC | PRN

## 2014-10-16 MED ORDER — FENTANYL CITRATE 0.05 MG/ML IJ SOLN
INTRAMUSCULAR | Status: DC | PRN
  Administered 2014-10-16: 75 ug via INTRAVENOUS

## 2014-10-16 MED ORDER — HYDROCODONE-ACETAMINOPHEN 5-325 MG PO TABS: 1.0000 | ORAL_TABLET | ORAL | Status: DC | PRN

## 2014-10-16 MED ORDER — METOPROLOL SUCCINATE ER 50 MG PO TB24
50.0000 mg | ORAL_TABLET | Freq: Every day | ORAL | Status: DC
  Administered 2014-10-17 – 2014-10-18 (×2): 50 mg via ORAL
  Filled 2014-10-16 (×2): qty 1

## 2014-10-16 MED ORDER — ONDANSETRON HCL 4 MG PO TABS: 4.0000 mg | ORAL_TABLET | Freq: Four times a day (QID) | ORAL | Status: DC | PRN

## 2014-10-16 MED ORDER — ONDANSETRON HCL 4 MG/2ML IJ SOLN
INTRAMUSCULAR | Status: AC
  Filled 2014-10-16: qty 2

## 2014-10-16 MED ORDER — WARFARIN 1.25 MG HALF TABLET
1.2500 mg | ORAL_TABLET | Freq: Every evening | ORAL | Status: DC
  Administered 2014-10-16 – 2014-10-17 (×2): 1.25 mg via ORAL
  Filled 2014-10-16 (×3): qty 1

## 2014-10-16 MED ORDER — CALCIUM CHLORIDE 10 % IV SOLN
INTRAVENOUS | Status: DC | PRN
  Administered 2014-10-16: 200 mg via INTRAVENOUS

## 2014-10-16 MED ORDER — ALENDRONATE SODIUM 70 MG PO TABS: 70.0000 mg | ORAL_TABLET | ORAL | Status: DC

## 2014-10-16 MED ORDER — ROCURONIUM BROMIDE 100 MG/10ML IV SOLN
INTRAVENOUS | Status: DC | PRN
  Administered 2014-10-16: 20 mg via INTRAVENOUS

## 2014-10-16 MED ORDER — CEFAZOLIN SODIUM 1-5 GM-% IV SOLN
1.0000 g | Freq: Four times a day (QID) | INTRAVENOUS | Status: AC
  Administered 2014-10-16 – 2014-10-17 (×3): 1 g via INTRAVENOUS
  Filled 2014-10-16 (×3): qty 50

## 2014-10-16 MED ORDER — FENTANYL CITRATE 0.05 MG/ML IJ SOLN
INTRAMUSCULAR | Status: AC
  Administered 2014-10-16: 11:00:00
  Filled 2014-10-16: qty 2

## 2014-10-16 MED ORDER — BUPIVACAINE-EPINEPHRINE (PF) 0.5% -1:200000 IJ SOLN
INTRAMUSCULAR | Status: AC
  Filled 2014-10-16: qty 30

## 2014-10-16 MED ORDER — FLEET ENEMA 7-19 GM/118ML RE ENEM: 1.0000 | ENEMA | Freq: Once | RECTAL | Status: AC | PRN

## 2014-10-16 MED ORDER — MENTHOL 3 MG MT LOZG: 1.0000 | LOZENGE | OROMUCOSAL | Status: DC | PRN

## 2014-10-16 MED ORDER — DIPHENHYDRAMINE HCL 12.5 MG/5ML PO ELIX: 12.5000 mg | ORAL_SOLUTION | ORAL | Status: DC | PRN

## 2014-10-16 MED ORDER — ACETAMINOPHEN 650 MG RE SUPP: 650.0000 mg | Freq: Four times a day (QID) | RECTAL | Status: DC | PRN

## 2014-10-16 MED ORDER — METOCLOPRAMIDE HCL 10 MG PO TABS: 5.0000 mg | ORAL_TABLET | Freq: Three times a day (TID) | ORAL | Status: DC | PRN

## 2014-10-16 MED ORDER — LIDOCAINE HCL (CARDIAC) 20 MG/ML IV SOLN
INTRAVENOUS | Status: DC | PRN
  Administered 2014-10-16: 60 mg via INTRAVENOUS

## 2014-10-16 MED ORDER — FENTANYL CITRATE 0.05 MG/ML IJ SOLN: 100.0000 ug | Freq: Once | INTRAMUSCULAR | Status: DC

## 2014-10-16 MED ORDER — ALBUMIN HUMAN 5 % IV SOLN
INTRAVENOUS | Status: DC | PRN
  Administered 2014-10-16: 11:00:00 via INTRAVENOUS

## 2014-10-16 MED ORDER — SODIUM CHLORIDE 0.9 % IV SOLN
INTRAVENOUS | Status: DC
  Administered 2014-10-17: 02:00:00 via INTRAVENOUS

## 2014-10-16 MED ORDER — DOCUSATE SODIUM 100 MG PO CAPS
100.0000 mg | ORAL_CAPSULE | Freq: Two times a day (BID) | ORAL | Status: DC
Start: 2014-10-16 — End: 2014-10-18
  Administered 2014-10-16 – 2014-10-17 (×3): 100 mg via ORAL
  Filled 2014-10-16 (×5): qty 1

## 2014-10-16 MED ORDER — GLYCOPYRROLATE 0.2 MG/ML IJ SOLN
INTRAMUSCULAR | Status: AC
  Filled 2014-10-16: qty 2

## 2014-10-16 MED ORDER — ALUMINUM HYDROXIDE GEL 320 MG/5ML PO SUSP
15.0000 mL | ORAL | Status: DC | PRN
  Filled 2014-10-16: qty 30

## 2014-10-16 MED ORDER — 0.9 % SODIUM CHLORIDE (POUR BTL) OPTIME
TOPICAL | Status: DC | PRN
  Administered 2014-10-16: 1000 mL

## 2014-10-16 MED ORDER — LACTATED RINGERS IV SOLN
INTRAVENOUS | Status: DC
  Administered 2014-10-16: 09:00:00 via INTRAVENOUS

## 2014-10-16 MED ORDER — ONDANSETRON HCL 4 MG/2ML IJ SOLN: 4.0000 mg | Freq: Four times a day (QID) | INTRAMUSCULAR | Status: DC | PRN

## 2014-10-16 MED ORDER — MORPHINE SULFATE 2 MG/ML IJ SOLN: 1.0000 mg | INTRAMUSCULAR | Status: DC | PRN

## 2014-10-16 MED ORDER — ONDANSETRON HCL 4 MG/2ML IJ SOLN
INTRAMUSCULAR | Status: DC | PRN
  Administered 2014-10-16: 4 mg via INTRAVENOUS

## 2014-10-16 MED ORDER — CEFAZOLIN SODIUM-DEXTROSE 2-3 GM-% IV SOLR
INTRAVENOUS | Status: AC
  Filled 2014-10-16: qty 50

## 2014-10-16 MED ORDER — ROCURONIUM BROMIDE 50 MG/5ML IV SOLN
INTRAVENOUS | Status: AC
  Filled 2014-10-16: qty 1

## 2014-10-16 MED ORDER — NEOSTIGMINE METHYLSULFATE 10 MG/10ML IV SOLN
INTRAVENOUS | Status: DC | PRN
  Administered 2014-10-16: 3 mg via INTRAVENOUS

## 2014-10-16 MED ORDER — LOSARTAN POTASSIUM 50 MG PO TABS
50.0000 mg | ORAL_TABLET | Freq: Every day | ORAL | Status: DC
  Administered 2014-10-17 – 2014-10-18 (×2): 50 mg via ORAL
  Filled 2014-10-16 (×2): qty 1

## 2014-10-16 MED ORDER — FUROSEMIDE 40 MG PO TABS: 40.0000 mg | ORAL_TABLET | Freq: Every day | ORAL | Status: DC | PRN

## 2014-10-16 MED ORDER — CALCIUM CHLORIDE 10 % IV SOLN
INTRAVENOUS | Status: AC
  Filled 2014-10-16: qty 10

## 2014-10-16 MED ORDER — METOCLOPRAMIDE HCL 5 MG/ML IJ SOLN: 5.0000 mg | Freq: Three times a day (TID) | INTRAMUSCULAR | Status: DC | PRN

## 2014-10-16 MED ORDER — ACETAMINOPHEN 325 MG PO TABS: 650.0000 mg | ORAL_TABLET | Freq: Four times a day (QID) | ORAL | Status: DC | PRN

## 2014-10-16 MED ORDER — DEXTROSE 5 % IV SOLN
10.0000 mg | INTRAVENOUS | Status: DC | PRN
  Administered 2014-10-16: 20 ug/min via INTRAVENOUS

## 2014-10-16 MED ORDER — LACTATED RINGERS IV SOLN
INTRAVENOUS | Status: DC | PRN
  Administered 2014-10-16 (×2): via INTRAVENOUS

## 2014-10-16 MED ORDER — GLYCOPYRROLATE 0.2 MG/ML IJ SOLN
INTRAMUSCULAR | Status: DC | PRN
  Administered 2014-10-16: 0.4 mg via INTRAVENOUS

## 2014-10-16 MED ORDER — FENTANYL CITRATE 0.05 MG/ML IJ SOLN
INTRAMUSCULAR | Status: AC
  Filled 2014-10-16: qty 5

## 2014-10-16 MED ORDER — BISACODYL 10 MG RE SUPP: 10.0000 mg | Freq: Every day | RECTAL | Status: DC | PRN

## 2014-10-16 SURGICAL SUPPLY — 71 items
ALLEN UNIVERSAL HEAD RESTRAINT ×2 IMPLANT
BIT DRILL 35MM (DRILL) ×1
BIT DRILL 3X200 (DRILL) ×1 IMPLANT
CHLORAPREP W/TINT 26ML (MISCELLANEOUS) ×3 IMPLANT
CLOSURE STERI-STRIP 1/2X4 (GAUZE/BANDAGES/DRESSINGS) ×1
CLOSURE WOUND 1/2 X4 (GAUZE/BANDAGES/DRESSINGS) ×1
CLSR STERI-STRIP ANTIMIC 1/2X4 (GAUZE/BANDAGES/DRESSINGS) ×1 IMPLANT
COVER SURGICAL LIGHT HANDLE (MISCELLANEOUS) ×6 IMPLANT
DRAPE C-ARM 42X72 X-RAY (DRAPES) ×3 IMPLANT
DRAPE IMP U-DRAPE 54X76 (DRAPES) ×3 IMPLANT
DRAPE INCISE IOBAN 66X45 STRL (DRAPES) ×3 IMPLANT
DRAPE PROXIMA HALF (DRAPES) ×3 IMPLANT
DRAPE SURG 17X23 STRL (DRAPES) ×3 IMPLANT
DRAPE U-SHAPE 47X51 STRL (DRAPES) ×3 IMPLANT
DRAPE U-SHAPE 76X120 STRL (DRAPES) ×2 IMPLANT
DRSG EMULSION OIL 3X3 NADH (GAUZE/BANDAGES/DRESSINGS) ×3 IMPLANT
DRSG MEPILEX BORDER 4X4 (GAUZE/BANDAGES/DRESSINGS) ×2 IMPLANT
DRSG MEPILEX BORDER 4X8 (GAUZE/BANDAGES/DRESSINGS) ×2 IMPLANT
DRSG PAD ABDOMINAL 8X10 ST (GAUZE/BANDAGES/DRESSINGS) ×3 IMPLANT
ELECT REM PT RETURN 9FT ADLT (ELECTROSURGICAL) ×3
ELECTRODE REM PT RTRN 9FT ADLT (ELECTROSURGICAL) ×1 IMPLANT
GAUZE SPONGE 4X4 12PLY STRL (GAUZE/BANDAGES/DRESSINGS) ×2 IMPLANT
GLOVE BIO SURGEON STRL SZ7 (GLOVE) ×5 IMPLANT
GLOVE BIO SURGEON STRL SZ7.5 (GLOVE) ×5 IMPLANT
GLOVE BIOGEL PI IND STRL 6 (GLOVE) IMPLANT
GLOVE BIOGEL PI IND STRL 7.0 (GLOVE) ×1 IMPLANT
GLOVE BIOGEL PI IND STRL 8 (GLOVE) ×1 IMPLANT
GLOVE BIOGEL PI INDICATOR 6 (GLOVE) ×2
GLOVE BIOGEL PI INDICATOR 7.0 (GLOVE) ×2
GLOVE BIOGEL PI INDICATOR 8 (GLOVE) ×2
GLOVE BIOGEL PI ORTHO PRO SZ7 (GLOVE) ×2
GLOVE PI ORTHO PRO STRL SZ7 (GLOVE) IMPLANT
GOWN STRL REUS W/ TWL LRG LVL3 (GOWN DISPOSABLE) ×3 IMPLANT
GOWN STRL REUS W/ TWL XL LVL3 (GOWN DISPOSABLE) ×1 IMPLANT
GOWN STRL REUS W/TWL LRG LVL3 (GOWN DISPOSABLE) ×9
GOWN STRL REUS W/TWL XL LVL3 (GOWN DISPOSABLE) ×3
KIT BASIN OR (CUSTOM PROCEDURE TRAY) ×3 IMPLANT
KIT ROOM TURNOVER OR (KITS) ×3 IMPLANT
MANIFOLD NEPTUNE II (INSTRUMENTS) ×3 IMPLANT
NAIL HUMERAL RIGHT (Nail) ×2 IMPLANT
NDL SUT .5 MAYO 1.404X.05X (NEEDLE) ×1 IMPLANT
NDL SUT 2 .5 CRC MAYO 1.732X (NEEDLE) ×1 IMPLANT
NEEDLE 22X1 1/2 (OR ONLY) (NEEDLE) IMPLANT
NEEDLE MAYO TAPER (NEEDLE) ×6
NS IRRIG 1000ML POUR BTL (IV SOLUTION) ×3 IMPLANT
PACK SHOULDER (CUSTOM PROCEDURE TRAY) ×3 IMPLANT
PACK UNIVERSAL I (CUSTOM PROCEDURE TRAY) ×3 IMPLANT
PAD ARMBOARD 7.5X6 YLW CONV (MISCELLANEOUS) ×6 IMPLANT
SCREW DISTAL 4.3X24MM (Screw) ×2 IMPLANT
SCREW DISTAL 5X32MM (Screw) ×4 IMPLANT
SLING ARM IMMOBILIZER LRG (SOFTGOODS) ×2 IMPLANT
SPONGE LAP 18X18 X RAY DECT (DISPOSABLE) ×6 IMPLANT
STAPLER VISISTAT 35W (STAPLE) ×3 IMPLANT
STRIP CLOSURE SKIN 1/2X4 (GAUZE/BANDAGES/DRESSINGS) ×2 IMPLANT
SUCTION FRAZIER TIP 10 FR DISP (SUCTIONS) ×3 IMPLANT
SUPPORT WRAP ARM LG (MISCELLANEOUS) ×3 IMPLANT
SUT BONE WAX W31G (SUTURE) IMPLANT
SUT ETHIBOND NAB CT1 #1 30IN (SUTURE) ×2 IMPLANT
SUT FIBERWIRE #2 38 T-5 BLUE (SUTURE) ×6
SUT MNCRL AB 4-0 PS2 18 (SUTURE) ×2 IMPLANT
SUT VIC AB 0 CT1 27 (SUTURE) ×3
SUT VIC AB 0 CT1 27XBRD ANBCTR (SUTURE) IMPLANT
SUT VIC AB 2-0 CT1 27 (SUTURE) ×3
SUT VIC AB 2-0 CT1 TAPERPNT 27 (SUTURE) ×2 IMPLANT
SUTURE FIBERWR #2 38 T-5 BLUE (SUTURE) ×2 IMPLANT
SYR CONTROL 10ML LL (SYRINGE) IMPLANT
TOWEL OR 17X24 6PK STRL BLUE (TOWEL DISPOSABLE) ×3 IMPLANT
TOWEL OR 17X26 10 PK STRL BLUE (TOWEL DISPOSABLE) ×3 IMPLANT
WATER STERILE IRR 1000ML POUR (IV SOLUTION) ×3 IMPLANT
WIRE GUIDE MODEL 22X500MM (WIRE) ×2 IMPLANT
YANKAUER SUCT BULB TIP NO VENT (SUCTIONS) ×3 IMPLANT

## 2014-10-16 NOTE — Progress Notes (Signed)
Report given to philip rn as caregiver 

## 2014-10-16 NOTE — Anesthesia Procedure Notes (Addendum)
Anesthesia Regional Block:  Interscalene brachial plexus block  Pre-Anesthetic Checklist: ,, timeout performed, Correct Patient, Correct Site, Correct Laterality, Correct Procedure, Correct Position, site marked, Risks and benefits discussed,  Surgical consent,  Pre-op evaluation,  At surgeon's request and post-op pain management  Laterality: Right  Prep: Maximum Sterile Barrier Precautions used, chloraprep and alcohol swabs       Needles:  Injection technique: Single-shot  Needle Type: Stimulator Needle - 40          Additional Needles:  Procedures: nerve stimulator Interscalene brachial plexus block  Nerve Stimulator or Paresthesia:  Response: 0.5 mA, 0.1 ms, 4 cm  Additional Responses:   Narrative:  Start time: 10/16/2014 10:20 AM End time: 10/16/2014 10:30 AM Injection made incrementally with aspirations every 5 mL.  Performed by: Personally  Anesthesiologist: Kate Sable  Additional Notes: Pt accepts procedure w/ risks. 20cc 0.5% Marcaine w/ epi w/o difficulty or discomfort. GES   Procedure Name: Intubation Date/Time: 10/16/2014 10:42 AM Performed by: Garrison [place] T Pre-anesthesia Checklist: Patient identified, Emergency Drugs available, Suction available and Patient being monitored Patient Re-evaluated:Patient Re-evaluated prior to inductionOxygen Delivery Method: Circle system utilized Preoxygenation: Pre-oxygenation with 100% oxygen Intubation Type: IV induction Ventilation: Mask ventilation without difficulty Laryngoscope Size: Mac and 3 Grade View: Grade I Tube type: Oral Number of attempts: 1 Airway Equipment and Method: Stylet and LTA kit utilized Placement Confirmation: ETT inserted through vocal cords under direct vision,  positive ETCO2 and breath sounds checked- equal and bilateral Secured at: 21 cm Tube secured with: Tape Dental Injury: Teeth and Oropharynx as per pre-operative assessment

## 2014-10-16 NOTE — Anesthesia Postprocedure Evaluation (Signed)
Anesthesia Post Note  Patient: Madeline Booth  Procedure(s) Performed: Procedure(s) (LRB): INTRAMEDULLARY NAIL PROXIMAL HUMERUS FRACTURE  (Right)  Anesthesia type: General  Patient location: PACU  Post pain: Pain level controlled and Adequate analgesia  Post assessment: Post-op Vital signs reviewed, Patient's Cardiovascular Status Stable, Respiratory Function Stable, Patent Airway and Pain level controlled  Last Vitals:  Filed Vitals:   10/16/14 1511  BP: 123/78  Pulse: 80  Temp: 36.6 C  Resp: 17    Post vital signs: Reviewed and stable  Level of consciousness: awake, alert  and oriented  Complications: No apparent anesthesia complications

## 2014-10-16 NOTE — Progress Notes (Signed)
PHARMACIST - PHYSICIAN COMMUNICATION  CONCERNING: P&T Medication Policy Regarding Oral Bisphosphonates  RECOMMENDATION: Your order for alendronate (Fosamax), ibandronate (Boniva), or risedronate (Actonel) has been discontinued at this time.  If the patient's post-hospital medical condition warrants safe use of this class of drugs, please resume the pre-hospital regimen upon discharge.  DESCRIPTION:  Alendronate (Fosamax), ibandronate (Boniva), and risedronate (Actonel) can cause severe esophageal erosions in patients who are unable to remain upright at least 30 minutes after taking this medication.   Since brief interruptions in therapy are thought to have minimal impact on bone mineral density, the [facility] has established that bisphosphonate orders should be routinely discontinued during hospitalization.   To override this safety policy and permit administration of Boniva, Fosamax, or Actonel in the hospital, prescribers must write "DO NOT HOLD" in the comments section when placing the order for this class of medications.  Eudelia Bunch, Pharm.D. [phone] 10/16/2014 3:01 PM

## 2014-10-16 NOTE — H&P (Signed)
Madeline Booth is an 81 y.o. female.   Chief Complaint: Right shoulder pain HPI: 81 year old female who suffered a fall one week ago with proximal humerus fracture with complete displacement. She lives independently and is very active.  Past Medical History  Diagnosis Date  . Hypertension   . Intertrochanteric fracture of right femur 09/21/2011  . Wears glasses   . Breast cancer     Right, s/p mastectomy  . PAF (paroxysmal atrial fibrillation)     On chronic coumadin  . Cataract cortical, senile   . Osteoporosis     Past Surgical History  Procedure Laterality Date  . Femur im nail  09/21/2011    Procedure: INTRAMEDULLARY (IM) NAIL FEMORAL;  Surgeon: Johnny Bridge;  Location: [facility];  Service: Orthopedics;  Laterality: Right;  . Appendectomy    . Tonsillectomy    . Dilation and curettage of uterus    . Sigmoidoscopy    . Mastectomy Right 1981  . Breast lumpectomy with axillary lymph node biopsy Left 2006    left-Dr Young-[facility]  . Open reduction internal fixation (orif) metacarpal Left 12/30/2013    Procedure: OPEN REDUCTION INTERNAL FIXATION (ORIF) METACARPAL DISLOCATION AND COLLATERAL LIGAMENT REPAIR;  Surgeon: [name], MD;  Location: [facility];  Service: Orthopedics;  Laterality: Left;  Open reduction left long finger metacarpophalangeal dislocation    Family History  Problem Relation Age of Onset  . Cancer      Neg hx  . Aneurysm Father   . Parkinson's disease Sister    Social History:  reports that she quit smoking about 34 years ago. Her smoking use included Cigarettes. She has never used smokeless tobacco. She reports that she drinks alcohol. She reports that she does not use illicit drugs.  Allergies: No Known Allergies  Medications Prior to Admission  Medication Sig Dispense Refill  . acetaminophen (TYLENOL) 325 MG tablet Take 2 tablets (650 mg total) by mouth every 4 (four) hours as needed for headache or mild pain. (Patient taking  differently: Take 650 mg by mouth every 4 (four) hours as needed for headache or mild pain. )    . alendronate (FOSAMAX) 70 MG tablet Take 70 mg by mouth every Sunday. Take with a full glass of water on an empty stomach.    Marland Kitchen amLODipine (NORVASC) 2.5 MG tablet Take 2.5 mg by mouth every evening.    . Calcium-Vitamin D (CALTRATE 600 PLUS-VIT D PO) Take 600 mg by mouth 2 (two) times daily.     . Cholecalciferol (MAXIMUM D3) 10000 UNITS CAPS Take 10,000 Units by mouth every Wednesday.    . furosemide (LASIX) 20 MG tablet Take 2 tablets (40 mg total) by mouth daily as needed for edema (for pedal edema). (Patient taking differently: Take 40 mg by mouth daily as needed for fluid or edema. ) 30 tablet 0  . losartan (COZAAR) 50 MG tablet Take 50 mg by mouth daily.    . metoprolol succinate (TOPROL-XL) 50 MG 24 hr tablet Take 50 mg by mouth daily. Take with or immediately following a meal.    . Multiple Vitamin (MULTIVITAMIN WITH MINERALS) TABS tablet Take 1 tablet by mouth daily. Centrum Silver    . senna (SENOKOT) 8.6 MG TABS tablet Take 1 tablet (8.6 mg total) by mouth 2 (two) times daily. 10 each 0  . vitamin E 200 UNIT capsule Take 200 Units by mouth daily.     Marland Kitchen warfarin (COUMADIN) 2.5 MG tablet Take 1.25 mg  by mouth every evening. 5pm at [facility]    . warfarin (COUMADIN) 2 MG tablet Take 0.5 tablets (1 mg total) by mouth daily. (Patient not taking: Reported on 10/15/2014)      Results for orders placed or performed during the hospital encounter of 10/15/14 (from the past 48 hour(s))  APTT     Status: Abnormal   Collection Time: 10/15/14  1:33 PM  Result Value Ref Range   aPTT 38 (H) 24 - 37 seconds    Comment:        IF BASELINE aPTT IS ELEVATED, SUGGEST PATIENT RISK ASSESSMENT BE USED TO DETERMINE APPROPRIATE ANTICOAGULANT THERAPY.   CBC WITH DIFFERENTIAL     Status: None   Collection Time: 10/15/14  1:33 PM  Result Value Ref Range   WBC 8.4 4.0 - 10.5 K/uL   RBC 4.02 3.87 - 5.11  MIL/uL   Hemoglobin 12.0 12.0 - 15.0 g/dL   HCT 36.7 36.0 - 46.0 %   MCV 91.3 78.0 - 100.0 fL   MCH 29.9 26.0 - 34.0 pg   MCHC 32.7 30.0 - 36.0 g/dL   RDW 14.8 11.5 - 15.5 %   Platelets 223 150 - 400 K/uL   Neutrophils Relative % 68 43 - 77 %   Neutro Abs 5.8 1.7 - 7.7 K/uL   Lymphocytes Relative 18 12 - 46 %   Lymphs Abs 1.5 0.7 - 4.0 K/uL   Monocytes Relative 11 3 - 12 %   Monocytes Absolute 0.9 0.1 - 1.0 K/uL   Eosinophils Relative 3 0 - 5 %   Eosinophils Absolute 0.2 0.0 - 0.7 K/uL   Basophils Relative 0 0 - 1 %   Basophils Absolute 0.0 0.0 - 0.1 K/uL  Comprehensive metabolic panel     Status: Abnormal   Collection Time: 10/15/14  1:33 PM  Result Value Ref Range   Sodium 138 135 - 145 mmol/L   Potassium 3.5 3.5 - 5.1 mmol/L   Chloride 102 96 - 112 mmol/L   CO2 28 19 - 32 mmol/L   Glucose, Bld 91 70 - 99 mg/dL   BUN 16 6 - 23 mg/dL   Creatinine, Ser 0.78 0.50 - 1.10 mg/dL   Calcium 8.5 8.4 - 10.5 mg/dL   Total Protein 6.3 6.0 - 8.3 g/dL   Albumin 2.7 (L) 3.5 - 5.2 g/dL   AST 29 0 - 37 U/L   ALT 21 0 - 35 U/L   Alkaline Phosphatase 82 39 - 117 U/L   Total Bilirubin 1.1 0.3 - 1.2 mg/dL   GFR calc non Af Amer 70 (L) >90 mL/min   GFR calc Af Amer 82 (L) >90 mL/min    Comment: (NOTE) The eGFR has been calculated using the CKD EPI equation. This calculation has not been validated in all clinical situations. eGFR's persistently <90 mL/min signify possible Chronic Kidney Disease.    Anion gap 8 5 - 15  Protime-INR     Status: Abnormal   Collection Time: 10/15/14  1:33 PM  Result Value Ref Range   Prothrombin Time 24.1 (H) 11.6 - 15.2 seconds   INR 2.14 (H) 0.00 - 1.49  Type and screen     Status: None   Collection Time: 10/15/14  1:35 PM  Result Value Ref Range   ABO/RH(D) O POS    Antibody Screen NEG    Sample Expiration 10/29/2014   ABO/Rh     Status: None   Collection Time: 10/15/14  1:35 PM  Result Value Ref Range   ABO/RH(D) O POS    No results  found.  Review of Systems  All other systems reviewed and are negative.   Blood pressure 139/73, pulse 89, temperature 98.1 F (36.7 C), temperature source Oral, resp. rate 18, height _0  (1.626 m), weight 68.663 kg (151 lb 6 oz), SpO2 97 %. Physical Exam  Constitutional: She is oriented to person, place, and time. She appears well-developed and well-nourished.  HENT:  Head: Atraumatic.  Eyes: EOM are normal.  Cardiovascular: Intact distal pulses.   Respiratory: Effort normal.  Musculoskeletal:  Right shoulder moderate swelling, pain with any motion and tender to palpation. Distally neurovascularly intact.  Neurological: She is alert and oriented to person, place, and time.  Skin: Skin is warm and dry.  Psychiatric: She has a normal mood and affect.     Assessment/Plan Right 2 part proximal humerus fracture with 100% displacement We discussed nonoperative and operative treatment strategies and she elected to go forward with surgery to try and maximize her outcome. She understood risks benefits alternatives to the procedure. Plan is for intramedullary nail versus ORIF. All questions were welcomed and answered.  Nita Sells 10/16/2014, 9:54 AM

## 2014-10-16 NOTE — Op Note (Signed)
Procedure(s): INTRAMEDULLARY NAIL PROXIMAL HUMERUS FRACTURE  Procedure Note  Madeline Booth female 81 y.o. 10/16/2014  Procedure(s) and Anesthesia Type:    * INTRAMEDULLARY NAIL RIGHT PROXIMAL HUMERUS FRACTURE  - Choice  Surgeon(s) and Role:    * Nita Sells, MD - Primary   Indications:  81 y.o. female s/p fall with right completely displaced 2 part proximal humerus fracture fracture. Indicated for surgery to promote anatomic restoration anatomy, improve functional outcome and avoid skin complications.     Surgeon: Nita Sells   Assistants: Jeanmarie Hubert PA-C [facility] was present and scrubbed throughout the procedure and was essential in positioning, retraction, exposure, and closure)  Anesthesia: General endotracheal anesthesia with preoperative interscalene block given by the attending anesthesiologist    Procedure Detail  INTRAMEDULLARY NAIL PROXIMAL HUMERUS FRACTURE   Findings: Near-anatomic reduction with [place] proximal humeral nail with 2 proximal locking screws and one distal dynamic interlocking screw.  Estimated Blood Loss:  less than 50 mL         Drains: none  Blood Given: none         Specimens: none        Complications:  * No complications entered in OR log *         Disposition: PACU - hemodynamically stable.         Condition: stable    Procedure:  DESCRIPTION OF PROCEDURE: The patient was identified in preoperative  holding area where I personally marked the operative site after  verifying site, side, and procedure with the patient. The patient was taken back  to the operating room where general anesthesia was induced without  complication and was placed in the beach-chair position with the back  elevated about 60 degrees and all extremities carefully padded and  positioned. The neck was turned very slightly away from the operative field  to assist in exposure. The right upper extremity was then prepped and   draped in a standard sterile fashion. The appropriate time-out  procedure was carried out. The patient did receive IV antibiotics  within 30 minutes of incision.  An incision was made in [employer] approximate 4 cm just lateral to the anterior lateral border of the acromion. Dissection was carried down through subcutaneous tissues to the deltoid fascia which was split in line with the anterior edge of the acromion. This was taken down off of the anterior acromion for a distance of about 2 cm and extended laterally 2 cm. Fracture hematoma was evacuated. A Ethibond suture was used to gain traction with the rotator cuff superiorly to allow a valgus pull. The reduction was achieved and the guidepin was placed using the sharp awl through the musculotendinous junction of the supraspinatus. The guidepin was advanced past the fracture. The nail was then advanced while holding the reduction. It was countersunk slightly. 2 proximal interlocking screws were placed in the greater tuberosity position by drilling measuring and filling with the appropriate size screws taking care to spread down directly to bone through small incisions to avoid any nerve injury. The distal interlocking screw was then placed using the Jig in the dynamic hole. Screw lengths were verified with fluoroscopic imaging and the jig was then removed. Final fluoroscopic imaging showed near anatomic reduction of the fracture with appropriate screw length and position. Wounds were copiously irrigated with normal saline and subsequent to closed in layers with 0 Vicryl to close the deltoid back to the anterior acromion and lateral deltoid. 2-0 Vicryl and 4-0 Monocryl were  used for skin closure. A light sterile dressing was applied. She was in place in a sling allowed to awaken from anesthesia transferred to stretcher and taken to recovery room in stable condition.  Postoperative plan: She will be observed overnight for pain control and likely discharge  back to her skilled nursing facility tomorrow. Follow-up 2 weeks in my office. She will remain in her sling.

## 2014-10-16 NOTE — Anesthesia Preprocedure Evaluation (Addendum)
Anesthesia Evaluation  Patient identified by MRN, date of birth, ID band Patient awake    Reviewed: Allergy & Precautions, NPO status , Patient's Chart, lab work & pertinent test results, reviewed documented beta blocker date and time   Airway Mallampati: II  TM Distance: >3 FB Neck ROM: Full    Dental  (+) Dental Advisory Given, Teeth Intact   Pulmonary former smoker,          Cardiovascular hypertension, Pt. on medications and Pt. on home beta blockers + dysrhythmias Atrial Fibrillation     Neuro/Psych    GI/Hepatic   Endo/Other    Renal/GU      Musculoskeletal   Abdominal   Peds  Hematology   Anesthesia Other Findings   Reproductive/Obstetrics                            Anesthesia Physical Anesthesia Plan  ASA: II  Anesthesia Plan: General and Regional   Post-op Pain Management:    Induction: Intravenous  Airway Management Planned: Oral ETT  Additional Equipment:   Intra-op Plan:   Post-operative Plan: Extubation in OR  Informed Consent: I have reviewed the patients History and Physical, chart, labs and discussed the procedure including the risks, benefits and alternatives for the proposed anesthesia with the patient or authorized representative who has indicated his/her understanding and acceptance.   Dental advisory given  Plan Discussed with: CRNA, Anesthesiologist and Surgeon  Anesthesia Plan Comments:        Anesthesia Quick Evaluation

## 2014-10-16 NOTE — Plan of Care (Signed)
Problem: Consults Goal: Diagnosis - Shoulder Surgery Total Shoulder Arthroplasty Right IM nail

## 2014-10-16 NOTE — Transfer of Care (Signed)
Immediate Anesthesia Transfer of Care Note  Patient: Madeline Booth  Procedure(s) Performed: Procedure(s) with comments: INTRAMEDULLARY NAIL PROXIMAL HUMERUS FRACTURE  (Right) - Intramedullary nail vs open reduction internal fixation right proximal humerus fracture  Patient Location: PACU  Anesthesia Type:General and Regional  Level of Consciousness: awake and alert   Airway & Oxygen Therapy: Patient Spontanous Breathing and Patient connected to nasal cannula oxygen  Post-op Assessment: Report given to RN, Post -op Vital signs reviewed and stable and Patient moving all extremities X 4  Post vital signs: Reviewed and stable  Last Vitals:  Filed Vitals:   10/16/14 1030  BP: 149/78  Pulse: 97  Temp:   Resp: 18    Complications: No apparent anesthesia complications

## 2014-10-17 DIAGNOSIS — S42201A Unspecified fracture of upper end of right humerus, initial encounter for closed fracture: Secondary | ICD-10-CM | POA: Diagnosis not present

## 2014-10-17 LAB — BASIC METABOLIC PANEL
Anion gap: 11 (ref 5–15)
BUN: 13 mg/dL (ref 6–23)
CO2: 26 mmol/L (ref 19–32)
Calcium: 7.6 mg/dL — ABNORMAL LOW (ref 8.4–10.5)
Chloride: 100 mmol/L (ref 96–112)
Creatinine, Ser: 0.81 mg/dL (ref 0.50–1.10)
GFR calc Af Amer: 71 mL/min — ABNORMAL LOW (ref >90)
GFR calc non Af Amer: 61 mL/min — ABNORMAL LOW (ref >90)
Glucose, Bld: 191 mg/dL — ABNORMAL HIGH (ref 70–99)
Potassium: 3.6 mmol/L (ref 3.5–5.1)
Sodium: 137 mmol/L (ref 135–145)

## 2014-10-17 LAB — CBC
HEMATOCRIT: 30.9 % — AB (ref 36.0–46.0)
Hemoglobin: 10 g/dL — ABNORMAL LOW (ref 12.0–15.0)
MCH: 29.9 pg (ref 26.0–34.0)
MCHC: 32.4 g/dL (ref 30.0–36.0)
MCV: 92.2 fL (ref 78.0–100.0)
Platelets: 210 10*3/uL (ref 150–400)
RBC: 3.35 MIL/uL — AB (ref 3.87–5.11)
RDW: 15.1 % (ref 11.5–15.5)
WBC: 7.9 10*3/uL (ref 4.0–10.5)

## 2014-10-17 LAB — PROTIME-INR
INR: 2.62 — AB (ref 0.00–1.49)
Prothrombin Time: 28.3 seconds — ABNORMAL HIGH (ref 11.6–15.2)

## 2014-10-17 MED ORDER — TRAMADOL HCL 50 MG PO TABS: 50.0000 mg | ORAL_TABLET | Freq: Four times a day (QID) | ORAL | Status: DC | PRN

## 2014-10-17 NOTE — Progress Notes (Signed)
   PATIENT ID: Stacie Glaze   1 Day Post-Op Procedure(s) (LRB): INTRAMEDULLARY NAIL PROXIMAL HUMERUS FRACTURE  (Right)  Subjective: Doing well, denies pain right shoulder.   Objective:  Filed Vitals:   10/17/14 0555  BP: 136/78  Pulse: 104  Temp: 98.2 F (36.8 C)  Resp: 17     Awake, alert orientated R UE dressing with scant dried blood, otherwise intact Wiggles fingers, distally NVI  Labs:   Recent Labs  10/15/14 1333 10/17/14 0505  HGB 12.0 10.0*   Recent Labs  10/15/14 1333 10/17/14 0505  WBC 8.4 7.9  RBC 4.02 3.35*  HCT 36.7 30.9*  PLT 223 210   Recent Labs  10/15/14 1333 10/17/14 0505  NA 138 137  K 3.5 3.6  CL 102 100  CO2 28 26  BUN 16 13  CREATININE 0.78 0.81  GLUCOSE 91 191*  CALCIUM 8.5 7.6*    Assessment and Plan: 1 day s/p right humeral IM nail Minimize pain rx, very little pain Can d/c today back to SNF ([facility]), SW consulted to expedite process OT today, hand wrist elbow only Sling Tramadol pain rx script in chart, only if needed  VTE proph: coumadin, SCDs

## 2014-10-17 NOTE — Discharge Instructions (Signed)
Discharge Instructions after Open Shoulder Repair  A sling has been provided for you. Remain in your sling at all times. This includes sleeping in your sling.  Use ice on the shoulder intermittently over the first 48 hours after surgery.  Pain medicine has been prescribed for you.  Use your medicine liberally over the first 48 hours, and then you can begin to taper your use. You may take Extra Strength Tylenol or Tylenol only in place of the pain pills. DO NOT take ANY nonsteroidal anti-inflammatory pain medications: Advil, Motrin, Ibuprofen, Aleve, Naproxen or Naprosyn.  You may remove your dressing after two days  You may shower 5 days after surgery. The incisions CANNOT get wet prior to 5 days. Simply allow the water to wash over the site and then pat dry. Do not rub the incisions. Make sure your axilla (armpit) is completely dry after showering.    Please call [phone] during normal business hours or [phone] after hours for any problems. Including the following:  - excessive redness of the incisions - drainage for more than 4 days - fever of more than 101.5 F  *Please note that pain medications will not be refilled after hours or on weekends.   

## 2014-10-17 NOTE — Progress Notes (Signed)
UR completed 

## 2014-10-17 NOTE — Progress Notes (Signed)
Occupational Therapy Evaluation Patient Details Name: Madeline Booth MRN: [id number] DOB: [birth year] Today's Date: 10/17/2014    History of Present Illness s/p fall @ 1/29. R humeral neck fx; s/p IM nail 2/5   Clinical Impression   PTA, pt was at [facility] due to fall. Prior to that, pt lived at home and was mod I with ADL and mobility. Pt will need to return to SNF for rehab. Began ROM R elbow/wrist/hand. Pt very fearful of any movement of arm or any mobility. Will follow to facilitate D/C to SNF and address acute OT goals.     Follow Up Recommendations  Supervision/Assistance - 24 hour;SNF    Equipment Recommendations   (TBA at SNF)    Recommendations for Other Services       Precautions / Restrictions Precautions Precautions: Fall;Shoulder Type of Shoulder Precautions: no shoulder ROM Shoulder Interventions: At all times;Shoulder sling/immobilizer;Off for dressing/bathing/exercises Restrictions Weight Bearing Restrictions: Yes RUE Weight Bearing: Non weight bearing      Mobility Bed Mobility Overal bed mobility: Needs Assistance Bed Mobility: Supine to Sit     Supine to sit: Mod assist;HOB elevated        Transfers Overall transfer level: Needs assistance Equipment used: 1 person hand held assist Transfers: Sit to/from [employer] Sit to Stand: Mod assist Stand pivot transfers: Mod assist       General transfer comment: pt fearful of falling    Balance Overall balance assessment: Needs assistance Sitting-balance support: Feet supported Sitting balance-Leahy Scale: Fair     Standing balance support: During functional activity;Single extremity supported Standing balance-Leahy Scale: Poor                              ADL           Upper Body Bathing: Moderate assistance;Sitting       Upper Body Dressing : Maximal assistance   Lower Body Dressing: Maximal assistance;Sit to/from stand   Toilet Transfer:  Moderate assistance Toilet Transfer Details (indicate cue type and reason): HHA Toileting- Clothing Manipulation and Hygiene: Maximal assistance         General ADL Comments: Pt stating "i can't " throughout ADL. Does well after encouragement.      Vision      wears glasses               Perception     Praxis      Pertinent Vitals/Pain Pain Assessment: Faces Faces Pain Scale: Hurts even more Pain Location: R shoulder Pain Descriptors / Indicators: Guarding;Moaning Pain Intervention(s): Limited activity within patient's tolerance;Monitored during session;Repositioned     Hand Dominance Left   Extremity/Trunk Assessment Upper Extremity Assessment Upper Extremity Assessment: RUE deficits/detail RUE Deficits / Details: elbow AAROM WFL; wrist and hand AROM WFL RUE: Unable to fully assess due to immobilization RUE Coordination: decreased gross motor   Lower Extremity Assessment Lower Extremity Assessment: Generalized weakness   Cervical / Trunk Assessment Cervical / Trunk Assessment: Kyphotic   Communication     Cognition Arousal/Alertness: Awake/alert Behavior During Therapy: WFL for tasks assessed/performed Overall Cognitive Status: No family/caregiver present to determine baseline cognitive functioning (appears to demonstrate minimal confusion?)                     General Comments       Exercises Exercises: Other exercises Other Exercises Other Exercises: elbow AAROM - flex/ext/sup/pron Other Exercises: wrist/hand AROM - all planes  Shoulder Instructions      Home Living Family/patient expects to be discharged to:: Skilled nursing facility                                        Prior Functioning/Environment Level of Independence: Needs assistance  Gait / Transfers Assistance Needed: +1 mod A ADL's / Homemaking Assistance Needed: mod A   Comments: receiving assistance @ SNF    OT Diagnosis: Generalized weakness;Acute  pain   OT Problem List: Decreased strength;Decreased range of motion;Decreased activity tolerance;Impaired balance (sitting and/or standing);Decreased coordination;Decreased safety awareness;Decreased knowledge of use of DME or AE;Decreased knowledge of precautions;Impaired UE functional use;Pain;Increased edema   OT Treatment/Interventions: Self-care/ADL training;Therapeutic exercise;DME and/or AE instruction;Therapeutic activities;Patient/family education;Balance training    OT Goals(Current goals can be found in the care plan section) Acute Rehab OT Goals Patient Stated Goal: back to [facility] OT Goal Formulation: With patient Time For Goal Achievement: 10/31/14 Potential to Achieve Goals: Good  OT Frequency: Min 2X/week   Barriers to D/C: Decreased caregiver support          Co-evaluation              End of Session Equipment Utilized During Treatment: Gait belt Nurse Communication: Mobility status;Precautions;Weight bearing status  Activity Tolerance: Patient tolerated treatment well Patient left: in chair;with call bell/phone within reach   Time: 1020-1048 OT Time Calculation (min): 28 min Charges:  OT General Charges $OT Visit: 1 Procedure OT Evaluation $Initial OT Evaluation Tier I: 1 Procedure OT Treatments $Self Care/Home Management : 8-22 mins G-Codes: OT G-codes **NOT FOR INPATIENT CLASS** Functional Assessment Tool Used: clinical judgement Functional Limitation: Self care Self Care Current Status (G4010): At least 80 percent but less than 100 percent impaired, limited or restricted Self Care Goal Status (U7253): At least 20 percent but less than 40 percent impaired, limited or restricted  Khloie Hamada,HILLARY 10/17/2014, 11:04 AM  [facility], OTR/L  [phone] 10/17/2014

## 2014-10-17 NOTE — Clinical Social Work Note (Signed)
Patient is resident at [facility] and will return once discharged.  Nonnie Done, [place] [phone]  Psychiatric & Orthopedics (5N 1-16) Clinical Social Worker

## 2014-10-17 NOTE — Clinical Social Work Psychosocial (Signed)
Clinical Social Work Department BRIEF PSYCHOSOCIAL ASSESSMENT 10/17/2014  Patient:  Madeline Booth, Madeline Booth     Account Number:  [id number]     Admit date:  10/16/2014  Clinical Social Worker:  Wylene Men  Date/Time:  10/17/2014 12:04 PM  Referred by:  Physician  Date Referred:  10/17/2014 Referred for  SNF Placement  Psychosocial assessment   Other Referral:   none   Interview type:  Patient Other interview type:   none    PSYCHOSOCIAL DATA Living Status:  FACILITY Admitted from facility:  [facility] Level of care:  [place] Primary support name:  Sherri Primary support relationship to patient:  FRIEND Degree of support available:   adequate    CURRENT CONCERNS Current Concerns  Post-Acute Placement   Other Concerns:   none    SOCIAL WORK ASSESSMENT / PLAN CSW assessed patient at bedside. Patient was alert and oriented x4 during the course of this assessment. patient reports being from [facility] SNF.  Patient wishes to return to SNF once medically cleared.  Patient expressed dislike in wearing her sling. CSW offered support and pysychosocial education to patient. Patient acknowledged understanding of the benefits of following MD recommendations of the sling and is agreeable to continue to wear the sling.  Patient is requesting [facility] transportaiton once discharged stating her friend who typically provides transportation is sick and would not be able to transport her.  CSW contacted SNF and patient can return. Clinicals sent.   Assessment/plan status:  Psychosocial Support/Ongoing Assessment of Needs Other assessment/ plan:   FL2 updated  PASARR- existing   Information/referral to community resources:   SNF/STR/LTC    PATIENT'S/FAMILY'S RESPONSE TO PLAN OF CARE: Patient is agreeable to returning to [facility], SNF.       Nonnie Done, [place] [phone]  Psychiatric & Orthopedics (5N 1-16) Clinical Social Worker

## 2014-10-18 DIAGNOSIS — S42201A Unspecified fracture of upper end of right humerus, initial encounter for closed fracture: Secondary | ICD-10-CM | POA: Diagnosis not present

## 2014-10-18 LAB — PROTIME-INR
INR: 2.37 — AB (ref 0.00–1.49)
Prothrombin Time: 26.1 seconds — ABNORMAL HIGH (ref 11.6–15.2)

## 2014-10-18 NOTE — Progress Notes (Signed)
Madeline Booth discharged to SNF per MD order. Discharge instructions reviewed and discussed with patient. All questions and concerns answered. Copy of instructions and scripts given to patient. IV removed.  Patient transported to SNF. No distress noted upon discharge.   Tarri Abernethy R 10/18/2014 1:41 PM

## 2014-10-18 NOTE — Progress Notes (Signed)
Occupational Therapy Treatment Patient Details Name: Madeline Booth MRN: [id number] DOB: 07-18-1920 Today's Date: 10/18/2014    History of present illness s/p fall @ 1/29. R humeral neck fx; s/p IM nail 2/5   OT comments  Pt up in chair. Focus of session on R elbow to hand AROM.  Tolerated 10 reps each.  Performs very slowly.  Pt with confusion about place and time and discharge plan.  Follow Up Recommendations  Supervision/Assistance - 24 hour;SNF    Equipment Recommendations       Recommendations for Other Services      Precautions / Restrictions Precautions Precautions: Fall;Shoulder Type of Shoulder Precautions: no shoulder ROM Shoulder Interventions: At all times;Shoulder sling/immobilizer;Off for dressing/bathing/exercises Restrictions Weight Bearing Restrictions: Yes RUE Weight Bearing: Non weight bearing       Mobility Bed Mobility                  Transfers                      Balance                                   ADL                                                Vision                     Perception     Praxis      Cognition   Behavior During Therapy: WFL for tasks assessed/performed Overall Cognitive Status: No family/caregiver present to determine baseline cognitive functioning       Memory: Decreased short-term memory (confused with place and time)               Extremity/Trunk Assessment               Exercises Other Exercises Other Exercises: R elbow, forearm, hand AROM x 10 each in sitting   Shoulder Instructions       General Comments      Pertinent Vitals/ Pain       Pain Assessment: No/denies pain  Home Living                                          Prior Functioning/Environment              Frequency Min 2X/week     Progress Toward Goals  OT Goals(current goals can now be found in the care plan section)  Progress  towards OT goals: Progressing toward goals  Acute Rehab OT Goals Patient Stated Goal: back to [facility]  Plan Discharge plan remains appropriate    Co-evaluation                 End of Session     Activity Tolerance Patient tolerated treatment well   Patient Left in chair;with call bell/phone within reach   Nurse Communication          Time: 1000-1020 OT Time Calculation (min): 20 min  Charges: OT General Charges $OT Visit: 1 Procedure OT Treatments $Therapeutic Exercise: 8-22 mins  Malka So 10/18/2014,  10:27 AM  [phone]

## 2014-10-18 NOTE — Clinical Social Work Note (Signed)
CSW made aware patient ready for d/c to [facility] contacted facility and spoke with Sharyn Lull who confirmed bed availability. CSW met with patient who is agreeable to d/c. Patient states, "I'm ready to go." Patient was pleasant and states she has already spoken with family regarding d/c. CSW faxed d/c summary to facility and prepared d/c packet. CSW placed d/c packet in patient's shadow chart. CSW provided RN Marye Round) with number for report and room. CSW to arrange transportation via [place]. No further needs. CSW signing off.  [place], [place] Weekend Clinical Social Worker [phone]

## 2014-10-18 NOTE — Progress Notes (Signed)
   PATIENT ID: Madeline Booth   2 Days Post-Op Procedure(s) (LRB): INTRAMEDULLARY NAIL PROXIMAL HUMERUS FRACTURE  (Right)  Subjective: Doing well this am. Sitting up in chair. No pain right shoulder. Is confused about d/c plans and wants to talk to SW again. Understands d/c to [facility].   Objective:  Filed Vitals:   10/18/14 0536  BP: 118/78  Pulse: 86  Temp: 98.3 F (36.8 C)  Resp: 18     R UE dressing c/d/i Wiggles fingers, distally NVI  Labs:   Recent Labs  10/15/14 1333 10/17/14 0505  HGB 12.0 10.0*   Recent Labs  10/15/14 1333 10/17/14 0505  WBC 8.4 7.9  RBC 4.02 3.35*  HCT 36.7 30.9*  PLT 223 210   Recent Labs  10/15/14 1333 10/17/14 0505  NA 138 137  K 3.5 3.6  CL 102 100  CO2 28 26  BUN 16 13  CREATININE 0.78 0.81  GLUCOSE 91 191*  CALCIUM 8.5 7.6*    Assessment and Plan: 2 days s/p right IM humeral nail Sling D/c to SNF today, bed should be ready, FL2 signed Minimize pain rx. Tylenol as needed  VTE proph: coumadin, SCDs

## 2014-10-18 NOTE — Discharge Summary (Signed)
Patient ID: Madeline Booth MRN: [id number] DOB/AGE: Jul 19, 1920 81 y.o.  Admit date: 10/16/2014 Discharge date: 10/18/2014  Admission Diagnoses:  Active Problems:   Proximal humerus fracture   Discharge Diagnoses:  Same  Past Medical History  Diagnosis Date  . Hypertension   . Intertrochanteric fracture of right femur 09/21/2011  . Wears glasses   . Breast cancer     Right, s/p mastectomy  . PAF (paroxysmal atrial fibrillation)     On chronic coumadin  . Cataract cortical, senile   . Osteoporosis     Surgeries: Procedure(s): INTRAMEDULLARY NAIL PROXIMAL HUMERUS FRACTURE  on 10/16/2014   Consultants:    Discharged Condition: Improved  Hospital Course: JAYLEAN BUENAVENTURA is an 81 y.o. female who was admitted 10/16/2014 for operative treatment of right displaced proximal humerus fracture. Patient had a fall earlier in the week resulting in a fracture requiring operative repair. After pre-op clearance the patient was taken to the operating room on 10/16/2014 and underwent  Procedure(s): INTRAMEDULLARY NAIL PROXIMAL HUMERUS FRACTURE .    Patient was given perioperative antibiotics: Anti-infectives    Start     Dose/Rate Route Frequency Ordered Stop   10/16/14 1600  ceFAZolin (ANCEF) IVPB 1 g/50 mL premix     1 g100 mL/hr over 30 Minutes Intravenous Every 6 hours 10/16/14 1455 10/17/14 0519   10/16/14 0811  ceFAZolin (ANCEF) 2-3 GM-% IVPB SOLR    Comments:  Jones, Tomika   : cabinet override      10/16/14 0811 10/16/14 2014   10/16/14 0600  ceFAZolin (ANCEF) IVPB 2 g/50 mL premix     2 g100 mL/hr over 30 Minutes Intravenous On call to O.R. 10/15/14 1325 10/16/14 1045       Patient was given sequential compression devices, early ambulation, and chemoprophylaxis to prevent DVT.  Patient benefited maximally from hospital stay and there were no complications.    Recent vital signs: Patient Vitals for the past 24 hrs:  BP Temp Pulse Resp SpO2  10/18/14 0536 118/78 mmHg 98.3 F (36.8 C)  86 18 96 %  10/17/14 2114 124/72 mmHg 98.4 F (36.9 C) 92 18 96 %  10/17/14 1500 102/62 mmHg 98.3 F (36.8 C) 88 18 96 %     Recent laboratory studies:  Recent Labs  10/15/14 1333 10/17/14 0505 10/18/14 0444  WBC 8.4 7.9  --   HGB 12.0 10.0*  --   HCT 36.7 30.9*  --   PLT 223 210  --   NA 138 137  --   K 3.5 3.6  --   CL 102 100  --   CO2 28 26  --   BUN 16 13  --   CREATININE 0.78 0.81  --   GLUCOSE 91 191*  --   INR 2.14* 2.62* 2.37*  CALCIUM 8.5 7.6*  --      Discharge Medications:     Medication List    TAKE these medications        acetaminophen 325 MG tablet  Commonly known as:  TYLENOL  Take 2 tablets (650 mg total) by mouth every 4 (four) hours as needed for headache or mild pain.     alendronate 70 MG tablet  Commonly known as:  FOSAMAX  Take 70 mg by mouth every Sunday. Take with a full glass of water on an empty stomach.     amLODipine 2.5 MG tablet  Commonly known as:  NORVASC  Take 2.5 mg by mouth every evening.  CALTRATE 600 PLUS-VIT D PO  Take 600 mg by mouth 2 (two) times daily.     furosemide 20 MG tablet  Commonly known as:  LASIX  Take 2 tablets (40 mg total) by mouth daily as needed for edema (for pedal edema).     losartan 50 MG tablet  Commonly known as:  COZAAR  Take 50 mg by mouth daily.     MAXIMUM D3 10000 UNITS Caps  Generic drug:  Cholecalciferol  Take 10,000 Units by mouth every Wednesday.     metoprolol succinate 50 MG 24 hr tablet  Commonly known as:  TOPROL-XL  Take 50 mg by mouth daily. Take with or immediately following a meal.     multivitamin with minerals Tabs tablet  Take 1 tablet by mouth daily. Centrum Silver     senna 8.6 MG Tabs tablet  Commonly known as:  SENOKOT  Take 1 tablet (8.6 mg total) by mouth 2 (two) times daily.     traMADol 50 MG tablet  Commonly known as:  ULTRAM  Take 1 tablet (50 mg total) by mouth every 6 (six) hours as needed.     vitamin E 200 UNIT capsule  Take 200 Units by  mouth daily.     warfarin 2.5 MG tablet  Commonly known as:  COUMADIN  Take 1.25 mg by mouth every evening. 5pm at [facility]     warfarin 2 MG tablet  Commonly known as:  COUMADIN  Take 0.5 tablets (1 mg total) by mouth daily.        Diagnostic Studies: Dg Chest 1 View  10/10/2014   CLINICAL DATA:  Status post fall in driveway, with right arm deformity and pain. Concern for chest injury. Initial encounter.  EXAM: CHEST - 1 VIEW  COMPARISON:  CTA of the chest performed 09/16/2014, and chest radiograph performed 09/15/2014  FINDINGS: The lungs are well-aerated. Mild left perihilar airspace opacity could reflect mild pneumonia or mild pulmonary edema. A small left pleural effusion is seen. The right lung appears relatively clear. There is no evidence of pneumothorax.  The cardiomediastinal silhouette is borderline normal in size. No acute osseous abnormalities are seen. Clips are seen overlying the right axilla.  IMPRESSION: Mild left perihilar airspace opacity could reflect mild pneumonia or mild pulmonary edema. Small left pleural effusion seen. No displaced rib fractures identified.   Electronically Signed   By: Garald Balding M.D.   On: 10/10/2014 02:52   Dg Pelvis 1-2 Views  10/10/2014   CLINICAL DATA:  Status post fall in driveway, with concern for pelvic injury. Initial encounter.  EXAM: PELVIS - 1-2 VIEW  COMPARISON:  Pelvis radiograph performed 09/21/2011, and CT of the pelvis from 03/17/2014  FINDINGS: There is no evidence of acute fracture. The patient is status post placement of intramedullary rod and screw in the proximal right femur. There appears to be mild loosening about the intramedullary rod. There is chronic deformity involving the pubic rami bilaterally.  Both femoral heads remain seated within their respective acetabuli. The sacroiliac joints demonstrate mild sclerotic change. Mild degenerative change is noted at the lower lumbar spine. The visualized bowel gas pattern is  grossly unremarkable. Diffuse vascular calcifications are seen.  IMPRESSION: 1. No evidence of acute fracture. 2. Apparent mild loosening about the intramedullary rod within the proximal right femur, though visualized portions of the hardware appear intact. 3. Diffuse vascular calcifications along the femoral regions bilaterally.   Electronically Signed   By: Francoise Schaumann.D.  On: 10/10/2014 02:49   Dg Shoulder Right  10/16/2014   CLINICAL DATA:  ORIF RIGHT proximal humerus  EXAM: DG C-ARM 61-120 MIN; RIGHT SHOULDER - 2+ VIEW  TECHNIQUE: Two digital C-arm fluoroscopic images obtained during surgery are submitted.  CONTRAST:  None  FLUOROSCOPY TIME:  Radiation Exposure Index (as provided by the fluoroscopic device): N/A  If the device does not provide the exposure index:  Fluoroscopy Time (in minutes and seconds):  0 min 57 seconds  Number of Acquired Images:  2  COMPARISON:  10/10/2014  FINDINGS: IM nail with multiple screws at proximal RIGHT humerus post ORIF of the previously identified RIGHT proximal humeral fracture.  Fracture appears reduced.  No glenohumeral dislocation identified.  Bones appear demineralized.  IMPRESSION: Post ORIF proximal RIGHT humerus.   Electronically Signed   By: Lavonia Dana M.D.   On: 10/16/2014 15:35   Dg Shoulder Right  10/10/2014   CLINICAL DATA:  Status post fall in driveway, with obvious deformity at the right shoulder. Initial encounter.  EXAM: RIGHT SHOULDER - 2+ VIEW  COMPARISON:  None.  FINDINGS: There is a displaced fracture through the right humeral neck, with approximately 1 shaft width medial and anterior displacement, and shortening at the fracture site, and significant lateral rotation of the right humeral head. Underlying mild degenerative change is noted at the right glenohumeral joint, with osteophyte formation about the humeral head.  The right humeral head is seated within the glenoid fossa. Mild degenerative change is noted at the right acromioclavicular  joint. Scattered clips are seen overlying the right axilla. No significant soft tissue abnormalities are seen. The right lung appears clear.  IMPRESSION: Displaced fracture through the right humeral neck, with approximately 1 shaft width medial and anterior displacement, and shortening at the fracture site, and significant lateral rotation of the humeral head. Underlying mild degenerative change at the right glenohumeral joint.   Electronically Signed   By: Garald Balding M.D.   On: 10/10/2014 02:37   Ct Head Wo Contrast  10/10/2014   CLINICAL DATA:  Patient was walking up stairs and fell onto concrete. Laceration above the right eyebrow. No loss of consciousness. Coumadin therapy.  EXAM: CT HEAD WITHOUT CONTRAST  CT CERVICAL SPINE WITHOUT CONTRAST  TECHNIQUE: Multidetector CT imaging of the head and cervical spine was performed following the standard protocol without intravenous contrast. Multiplanar CT image reconstructions of the cervical spine were also generated.  COMPARISON:  03/27/2014  FINDINGS: CT HEAD FINDINGS  Diffuse cerebral atrophy. Ventricular dilatation consistent with central atrophy. Low-attenuation changes in the deep white matter consistent with small vessel ischemia. No mass effect or midline shift. No abnormal extra-axial fluid collections. Gray-white matter junctions are distinct. Basal cisterns are not effaced. No evidence of acute intracranial hemorrhage. No depressed skull fractures. Vascular calcifications. Small air-fluid levels in the sphenoid sinus. Visualized paranasal sinuses and mastoid air cells are otherwise clear.  CT CERVICAL SPINE FINDINGS  Diffuse bone demineralization. Diffuse degenerative change throughout the cervical spine with narrowed cervical interspaces and endplate hypertrophic changes. Degenerative changes also seen at C1 to in throughout the facet joints. There is mild anterior subluxation of C4 on C5 which is unchanged since the previous study and is likely  degenerative. Slight cortical irregularity at the base of the odontoid process is unchanged since prior study and likely represents old lesion or degenerative change. No vertebral compression deformities. No prevertebral soft tissue swelling. C1-2 articulation appears intact. No focal bone lesion or bone destruction. Bone cortex and  trabecular architecture appear intact. Soft tissues are unremarkable. A large pleural effusion demonstrated on the left apex. Azygos lobe.  IMPRESSION: No acute intracranial abnormalities. Chronic atrophy and small vessel ischemic changes.  Diffuse degenerative change throughout the cervical spine. No change in alignment since prior studies. No displaced fractures identified. Large left pleural effusion is incidentally noted.   Electronically Signed   By: Lucienne Capers M.D.   On: 10/10/2014 02:07   Ct Cervical Spine Wo Contrast  10/10/2014   CLINICAL DATA:  Patient was walking up stairs and fell onto concrete. Laceration above the right eyebrow. No loss of consciousness. Coumadin therapy.  EXAM: CT HEAD WITHOUT CONTRAST  CT CERVICAL SPINE WITHOUT CONTRAST  TECHNIQUE: Multidetector CT imaging of the head and cervical spine was performed following the standard protocol without intravenous contrast. Multiplanar CT image reconstructions of the cervical spine were also generated.  COMPARISON:  03/27/2014  FINDINGS: CT HEAD FINDINGS  Diffuse cerebral atrophy. Ventricular dilatation consistent with central atrophy. Low-attenuation changes in the deep white matter consistent with small vessel ischemia. No mass effect or midline shift. No abnormal extra-axial fluid collections. Gray-white matter junctions are distinct. Basal cisterns are not effaced. No evidence of acute intracranial hemorrhage. No depressed skull fractures. Vascular calcifications. Small air-fluid levels in the sphenoid sinus. Visualized paranasal sinuses and mastoid air cells are otherwise clear.  CT CERVICAL SPINE  FINDINGS  Diffuse bone demineralization. Diffuse degenerative change throughout the cervical spine with narrowed cervical interspaces and endplate hypertrophic changes. Degenerative changes also seen at C1 to in throughout the facet joints. There is mild anterior subluxation of C4 on C5 which is unchanged since the previous study and is likely degenerative. Slight cortical irregularity at the base of the odontoid process is unchanged since prior study and likely represents old lesion or degenerative change. No vertebral compression deformities. No prevertebral soft tissue swelling. C1-2 articulation appears intact. No focal bone lesion or bone destruction. Bone cortex and trabecular architecture appear intact. Soft tissues are unremarkable. A large pleural effusion demonstrated on the left apex. Azygos lobe.  IMPRESSION: No acute intracranial abnormalities. Chronic atrophy and small vessel ischemic changes.  Diffuse degenerative change throughout the cervical spine. No change in alignment since prior studies. No displaced fractures identified. Large left pleural effusion is incidentally noted.   Electronically Signed   By: Lucienne Capers M.D.   On: 10/10/2014 02:07   Dg Humerus Right  10/10/2014   CLINICAL DATA:  Status post fall in driveway, with obvious deformity at the right shoulder, and pain radiating down the right arm. Initial encounter.  EXAM: RIGHT HUMERUS - 2+ VIEW  COMPARISON:  None.  FINDINGS: There is a significantly displaced fracture at the right humeral neck, with 1 shaft width medial and anterior displacement of the distal humerus, and significant lateral rotation of the humeral head.  No additional fractures are seen. The distal humerus appears intact. The elbow joint is grossly unremarkable. Mild degenerative change is noted at the right acromioclavicular joint.  IMPRESSION: Significantly displaced fracture of the right humeral neck, with 1 shaft width medial and anterior displacement of the  distal humerus, and significant lateral rotation of the humeral head.   Electronically Signed   By: Garald Balding M.D.   On: 10/10/2014 02:45   Dg C-arm 1-60 Min  10/16/2014   CLINICAL DATA:  ORIF RIGHT proximal humerus  EXAM: DG C-ARM 61-120 MIN; RIGHT SHOULDER - 2+ VIEW  TECHNIQUE: Two digital C-arm fluoroscopic images obtained during surgery are submitted.  CONTRAST:  None  FLUOROSCOPY TIME:  Radiation Exposure Index (as provided by the fluoroscopic device): N/A  If the device does not provide the exposure index:  Fluoroscopy Time (in minutes and seconds):  0 min 57 seconds  Number of Acquired Images:  2  COMPARISON:  10/10/2014  FINDINGS: IM nail with multiple screws at proximal RIGHT humerus post ORIF of the previously identified RIGHT proximal humeral fracture.  Fracture appears reduced.  No glenohumeral dislocation identified.  Bones appear demineralized.  IMPRESSION: Post ORIF proximal RIGHT humerus.   Electronically Signed   By: Lavonia Dana M.D.   On: 10/16/2014 15:35    Disposition: 03-Skilled Nursing Facility      Discharge Instructions    Call MD / Call 911    Complete by:  As directed   If you experience chest pain or shortness of breath, CALL 911 and be transported to the hospital emergency room.  If you develope a fever above 101 F, pus (white drainage) or increased drainage or redness at the wound, or calf pain, call your surgeon's office.     Call MD / Call 911    Complete by:  As directed   If you experience chest pain or shortness of breath, CALL 911 and be transported to the hospital emergency room.  If you develope a fever above 101 F, pus (white drainage) or increased drainage or redness at the wound, or calf pain, call your surgeon's office.     Constipation Prevention    Complete by:  As directed   Drink plenty of fluids.  Prune juice may be helpful.  You may use a stool softener, such as Colace (over the counter) 100 mg twice a day.  Use MiraLax (over the counter) for  constipation as needed.     Constipation Prevention    Complete by:  As directed   Drink plenty of fluids.  Prune juice may be helpful.  You may use a stool softener, such as Colace (over the counter) 100 mg twice a day.  Use MiraLax (over the counter) for constipation as needed.     Diet - low sodium heart healthy    Complete by:  As directed      Diet - low sodium heart healthy    Complete by:  As directed      Increase activity slowly as tolerated    Complete by:  As directed      Increase activity slowly as tolerated    Complete by:  As directed            Follow-up Information    Follow up with Nita Sells, MD. Schedule an appointment as soon as possible for a visit in 2 weeks.   Specialty:  Orthopedic Surgery   Contact information:   [address] [place] [address] [phone]        Signed: Grier Mitts 10/18/2014, 11:49 AM

## 2014-10-20 ENCOUNTER — Encounter (HOSPITAL_COMMUNITY): Payer: Self-pay | Admitting: Orthopedic Surgery

## 2014-10-21 ENCOUNTER — Encounter (HOSPITAL_COMMUNITY): Payer: Self-pay | Admitting: Orthopedic Surgery

## 2015-06-21 IMAGING — CT CT PELVIS W/O CM
1 series · 16 of 32 positions shown, 20 images · non-contrast
Comparison: None.

CLINICAL DATA: Right hip pain after fall.

EXAM:
CT PELVIS WITHOUT CONTRAST
TECHNIQUE: Multidetector CT imaging of the pelvis was performed following the
standard protocol without intravenous contrast.

[Series 3: pelvis st · axial · 0.70mm/px · z∈[-209,+10]mm · 16 of 82 slices shown, 20 images]
[im 6/82  soft-tissue]
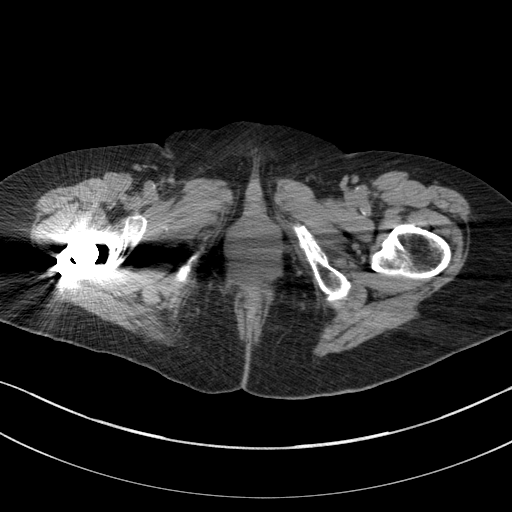
[im 6/82  bone]
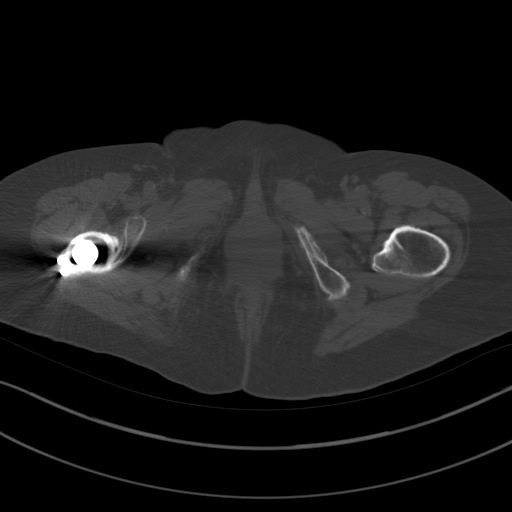
[im 11/82  soft-tissue]
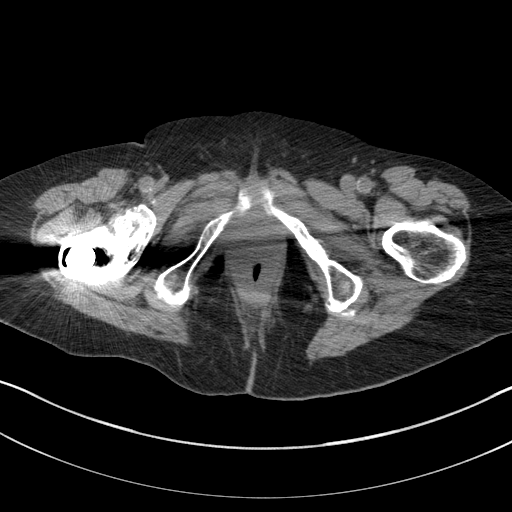
[im 16/82  soft-tissue]
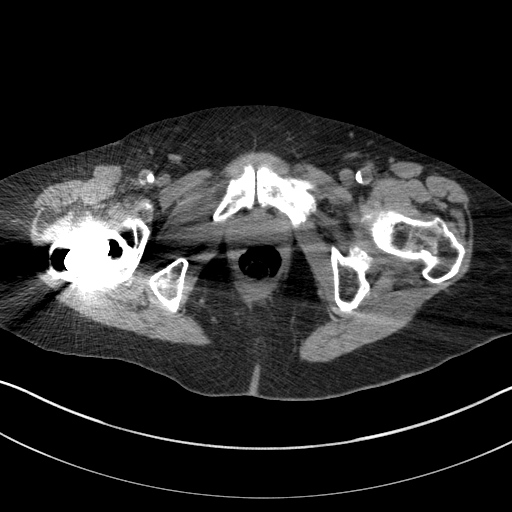
[im 21/82  soft-tissue]
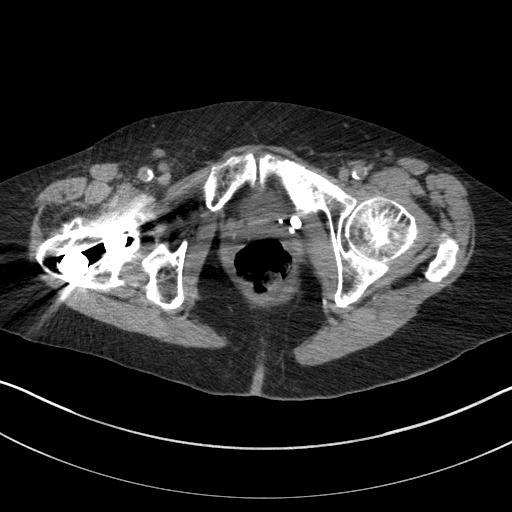
[im 27/82  soft-tissue]
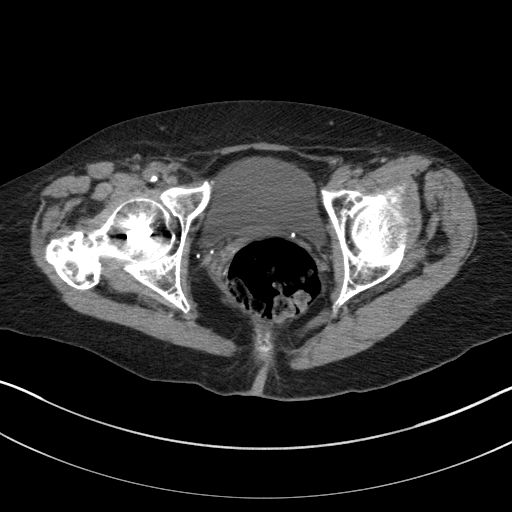
[im 32/82  soft-tissue]
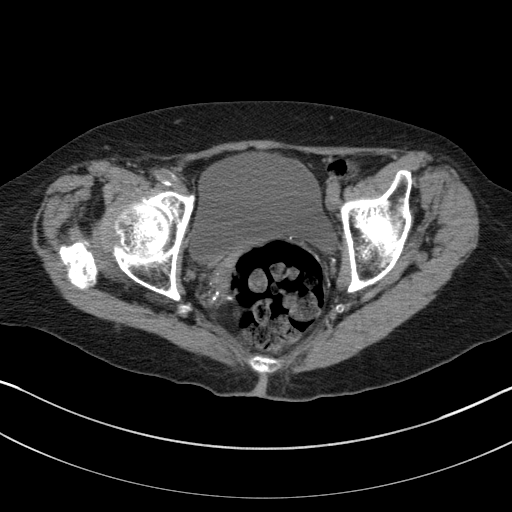
[im 37/82  soft-tissue]
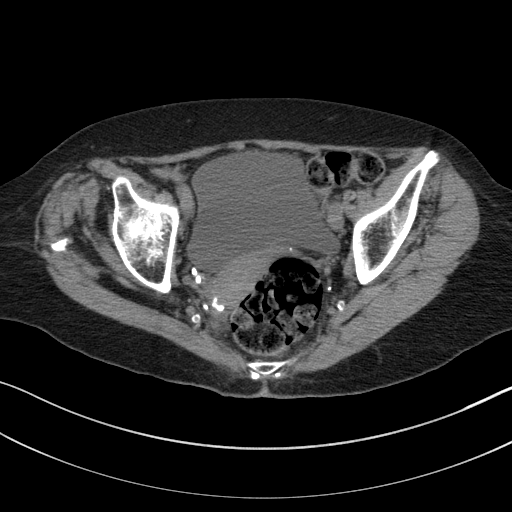
[im 45/82  soft-tissue]
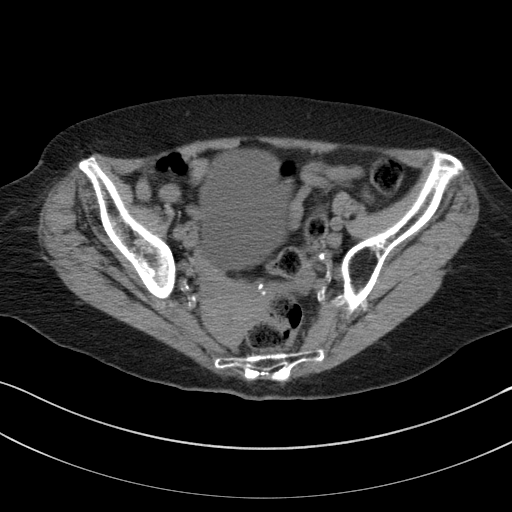
[im 50/82  soft-tissue]
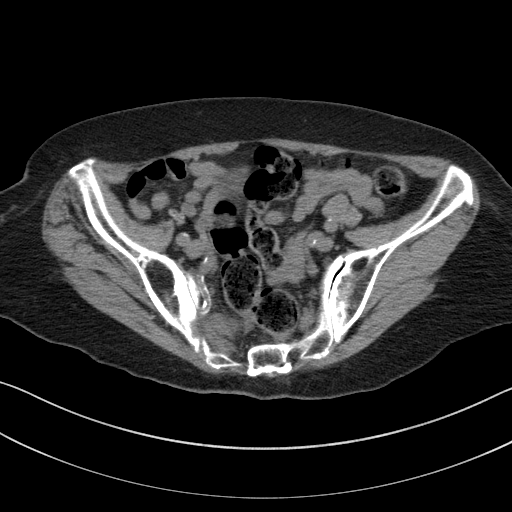
[im 50/82  bone]
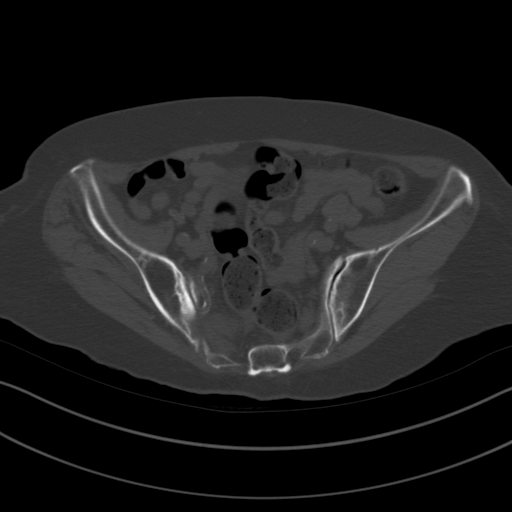
[im 55/82  soft-tissue]
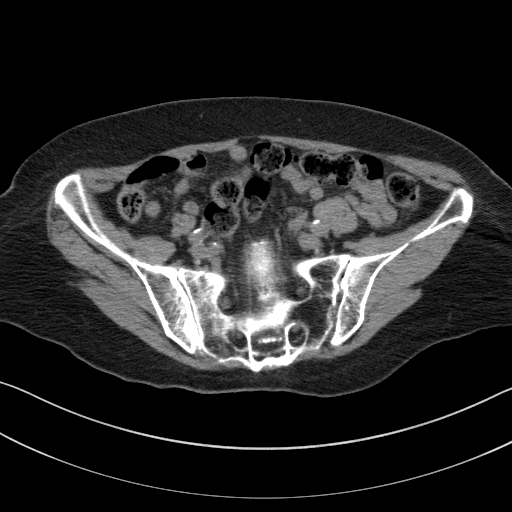
[im 61/82  soft-tissue]
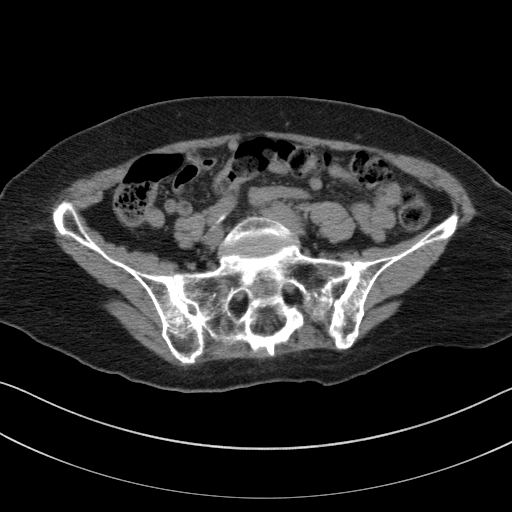
[im 66/82  soft-tissue]
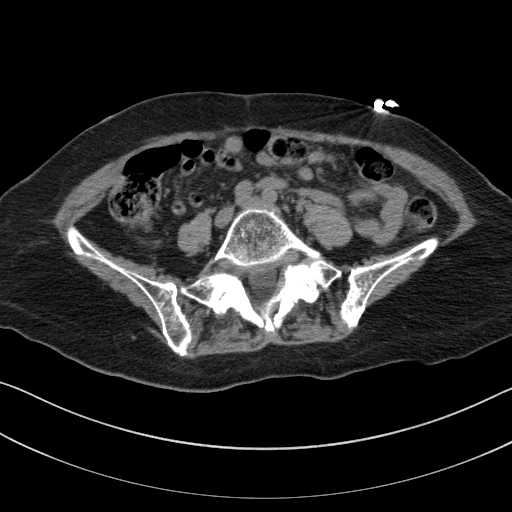
[im 71/82  soft-tissue]
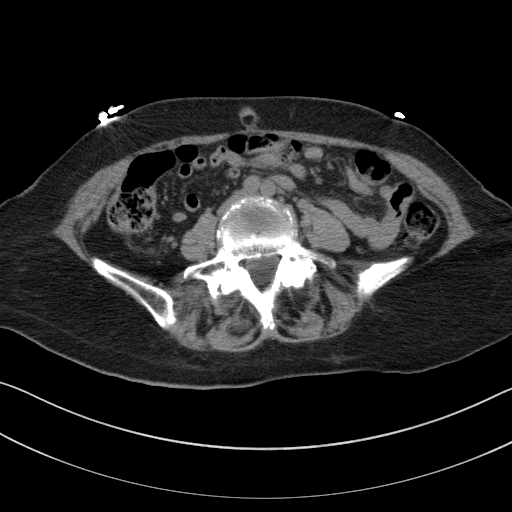
[im 71/82  lung]
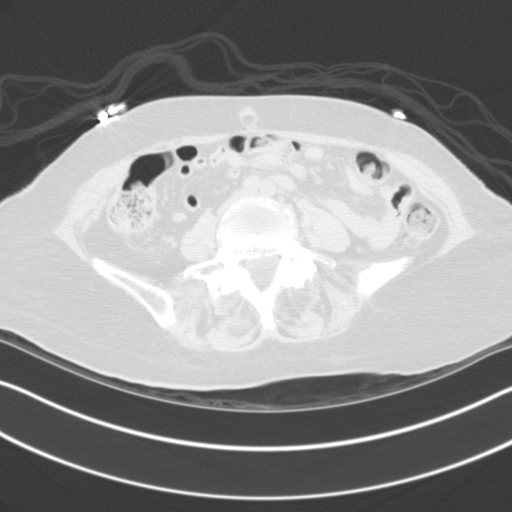
[im 74/82  lung]
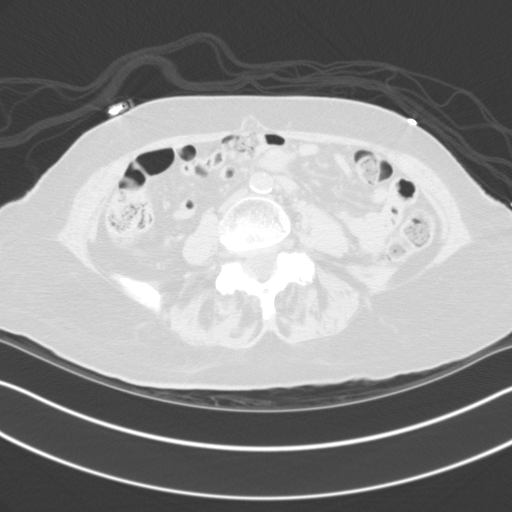
[im 76/82  soft-tissue]
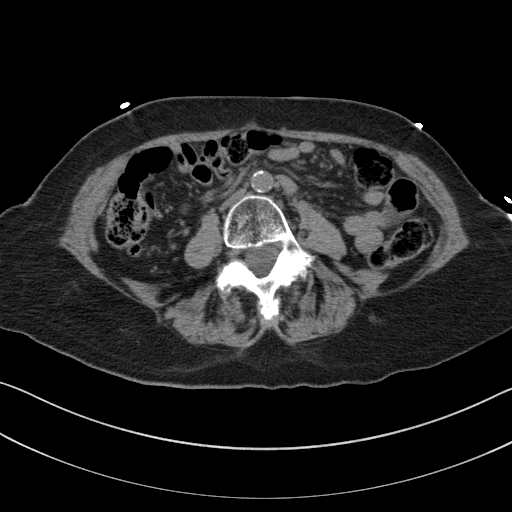
[im 76/82  lung]
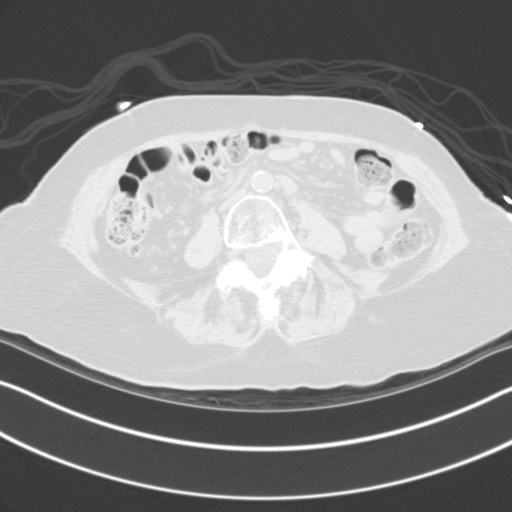
[im 79/82  lung]
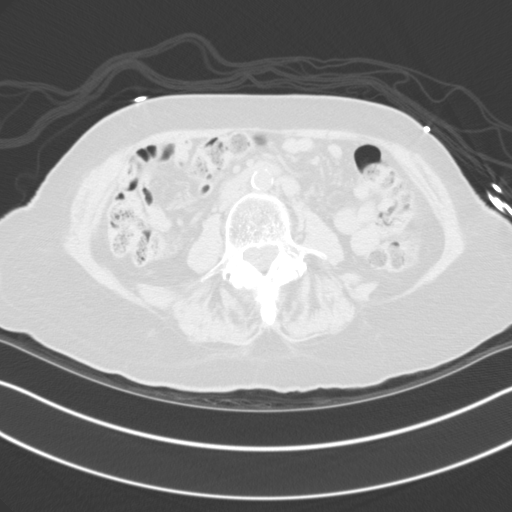

[16 of 32 positions shown; findings below may reference images not displayed]

FINDINGS: Status post internal fixation of old right proximal femoral neck
fracture. Old fractures of the left superior and inferior pubic rami
are noted. Left hip joint appears normal. Sacroiliac joints appear
normal. Nondisplaced acute fracture is seen involving the anterior
and superior portion of the right sacrum. Degenerative changes are
noted in lower lumbar spine stool is noted in the rectum and sigmoid
colon. No abnormal fluid collection is noted in the peritoneal
space.
IMPRESSION: Status post surgical internal fixation of old proximal right femoral
neck fracture. Old healed fractures of the left superior and
inferior pubic rami are noted. Nondisplaced acute fracture involving
anterior and superior aspect of right side of sacrum.

## 2015-06-21 IMAGING — CR DG HIP COMPLETE 2+V*R*
3 series · 3 of 3 positions shown · non-contrast
Comparison: 09/21/2011, 09/20/2011.

CLINICAL DATA: Fall. Right hip pain. Prior ORIF of an
intertrochanteric right femoral neck fracture in September 2011.

EXAM:
RIGHT HIP - COMPLETE 2+ VIEW

[t pelvis ap]
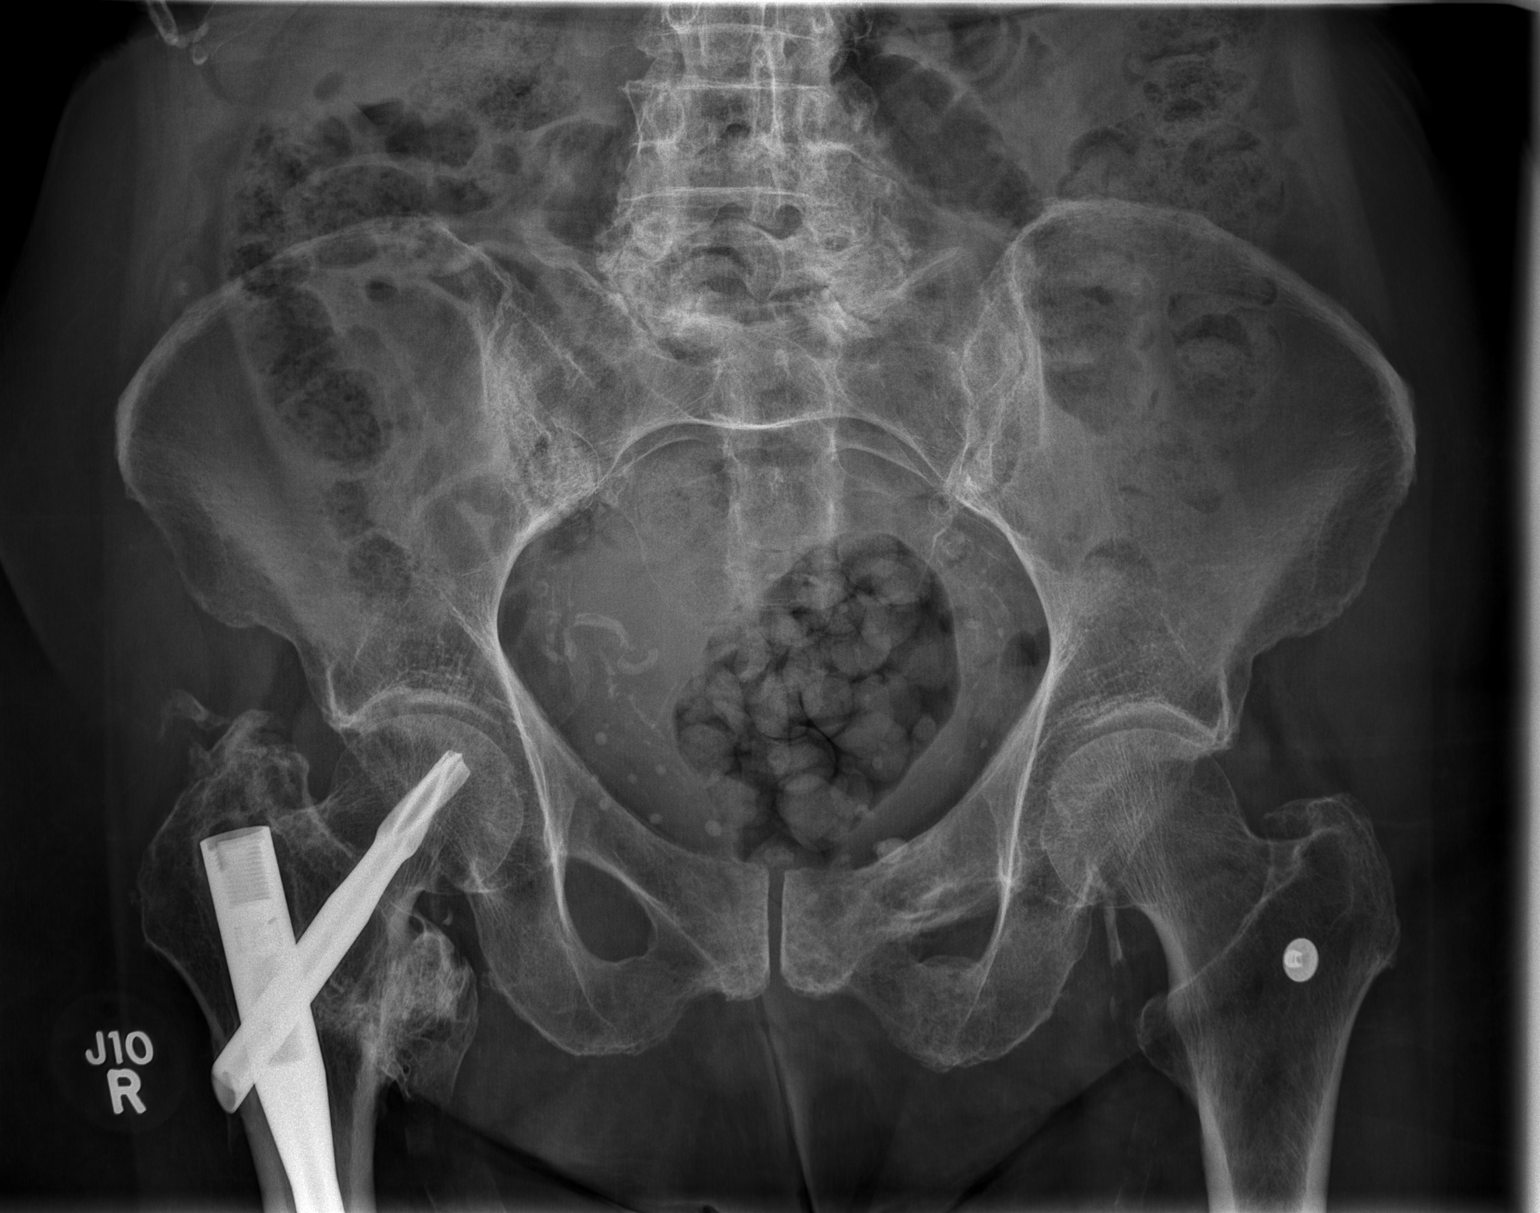

[t hip ap right]
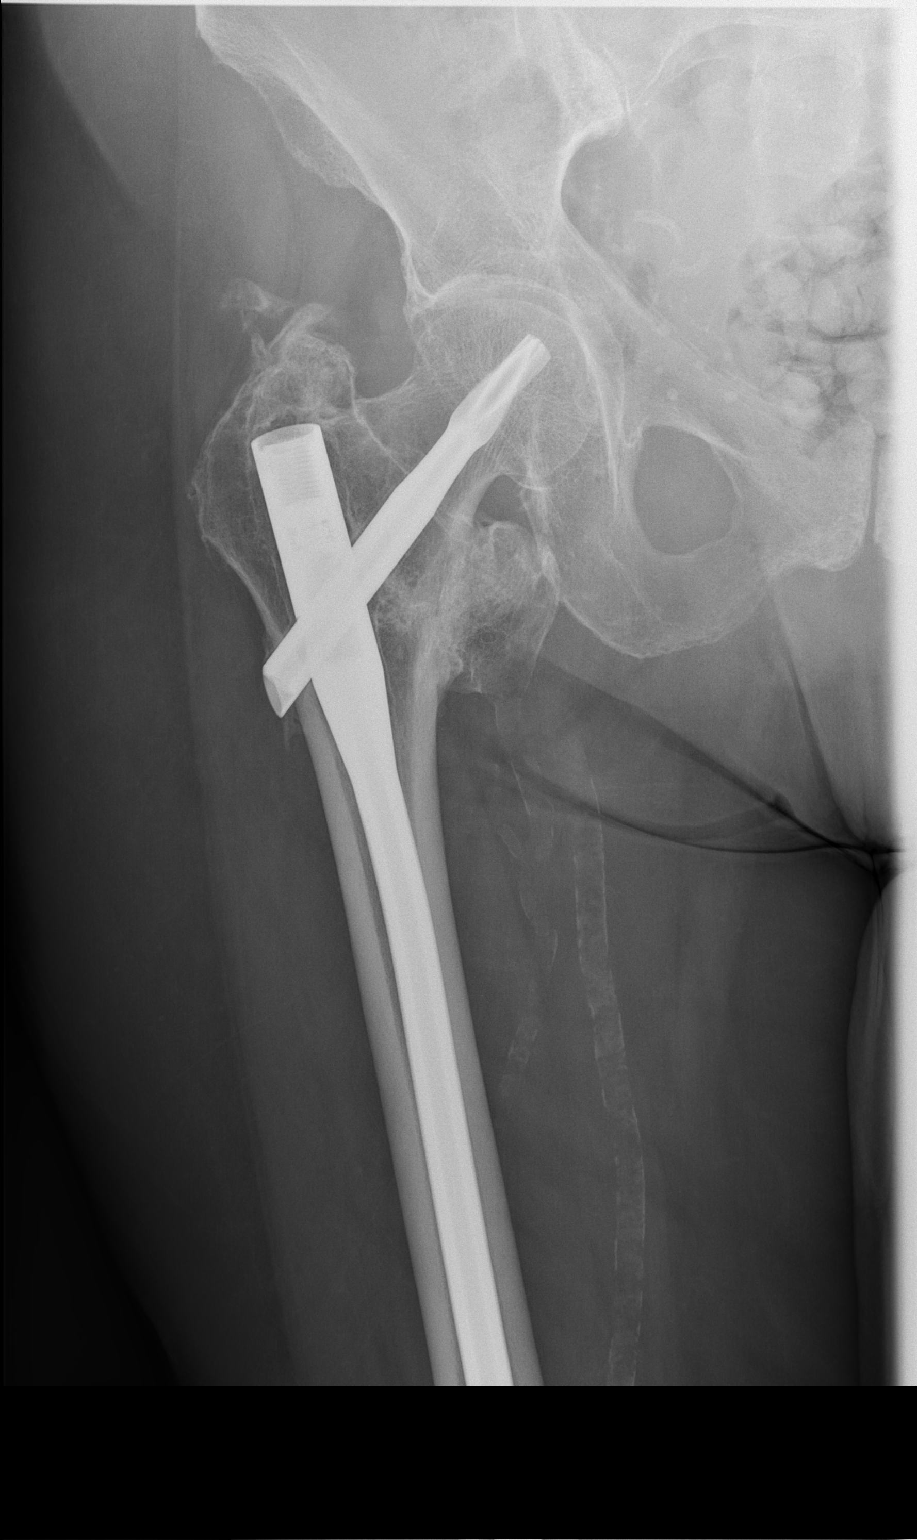

[w hip lat right]
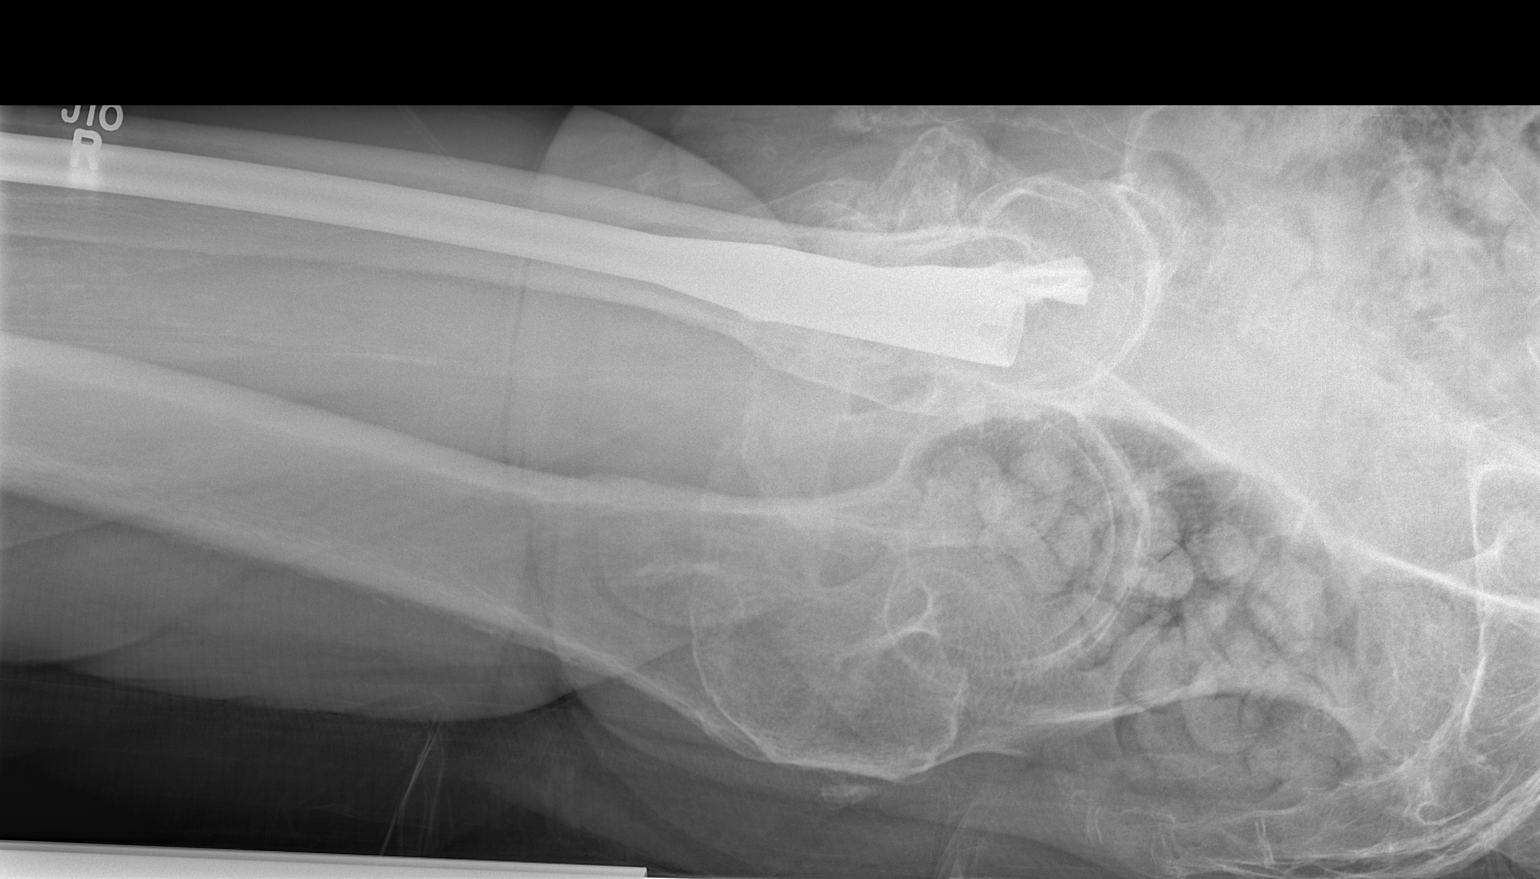

[3 of 3 positions shown; findings below may reference images not displayed]

FINDINGS: Prior ORIF of the intertrochanteric right femoral neck fracture with
healing. Dystrophic calcification/myositis ossificans above the
greater trochanter. No evidence of acute fracture or dislocation.
Hip joint anatomically aligned with mild joint space narrowing,
unchanged.

Included AP pelvis demonstrates well-preserved joint space in the
contralateral left hip. Interval healed fracture involving the left
inferior pubic ramus. Degenerative changes involving the sacroiliac
joints and symphysis pubis, unchanged. Degenerative changes
involving the visualized lower lumbar spine, unchanged.
IMPRESSION: 1. No acute osseous abnormality.
2. ORIF of a prior right femoral neck fracture with healing.
3. Mild osteoarthritis involving the right hip.
4. Healed left inferior pubic ramus fracture.

## 2016-01-10 IMAGING — RF DG C-ARM 61-120 MIN
1 series · 1 of 1 positions shown · IV contrast (agent unspecified)
Comparison: 10/10/2014

CLINICAL DATA: ORIF RIGHT proximal humerus

EXAM:
DG C-ARM 61-120 MIN; RIGHT SHOULDER - 2+ VIEW
TECHNIQUE: Two digital C-arm fluoroscopic images obtained during surgery are
submitted.
CONTRAST:  None
FLUOROSCOPY TIME:  Radiation Exposure Index (as provided by the
fluoroscopic device): N/A
If the device does not provide the exposure index:
Fluoroscopy Time (in minutes and seconds):  0 min 57 seconds
Number of Acquired Images:  2

[Series 1: run · 1 of 1 slices shown]
[im 1/1]
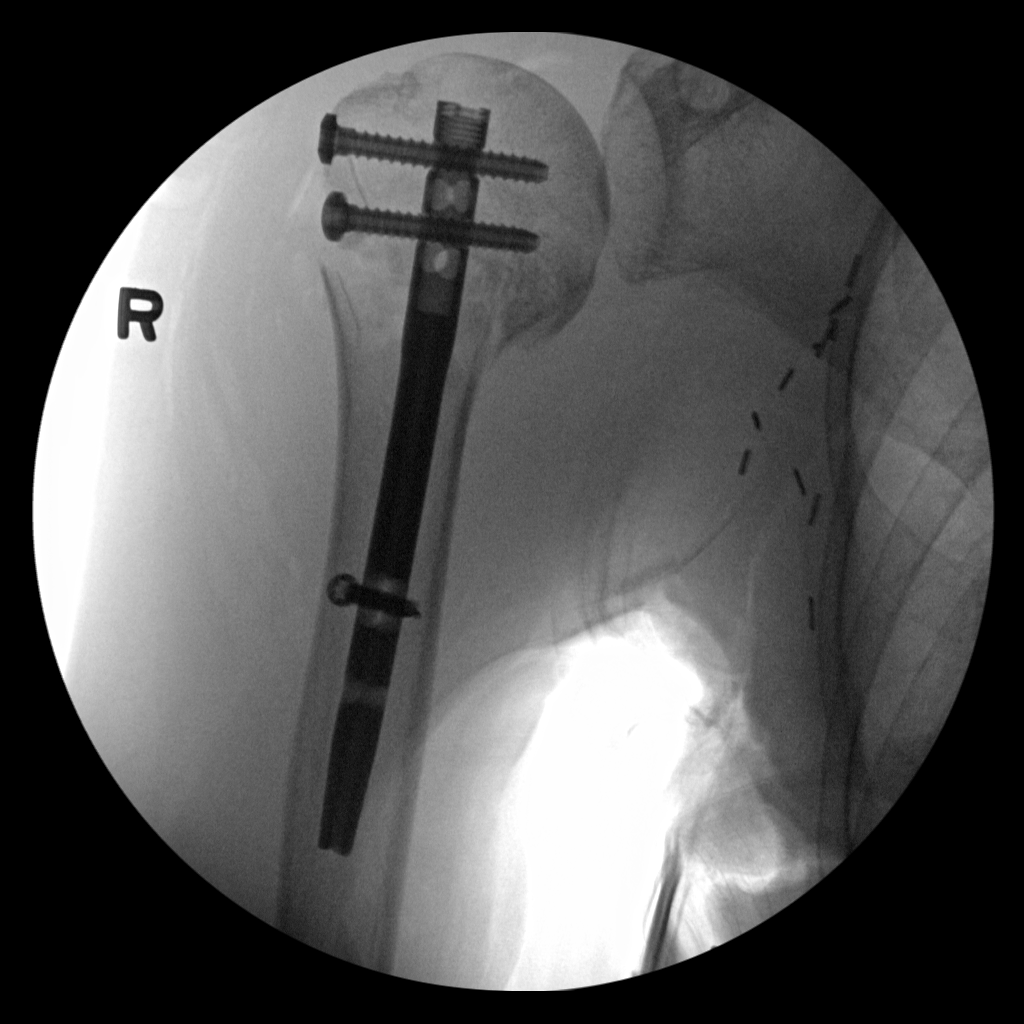

[1 of 1 positions shown; findings below may reference images not displayed]

FINDINGS: IM nail with multiple screws at proximal RIGHT humerus post ORIF of
the previously identified RIGHT proximal humeral fracture.

Fracture appears reduced.

No glenohumeral dislocation identified.

Bones appear demineralized.
IMPRESSION: Post ORIF proximal RIGHT humerus.

## 2016-03-15 ENCOUNTER — Emergency Department (HOSPITAL_COMMUNITY): Payer: Medicare Other

## 2016-03-15 ENCOUNTER — Encounter (HOSPITAL_COMMUNITY): Payer: Self-pay | Admitting: Emergency Medicine

## 2016-03-15 ENCOUNTER — Emergency Department (HOSPITAL_COMMUNITY)
Admission: EM | Admit: 2016-03-15 | Discharge: 2016-03-15 | Disposition: A | Discharge status: Home / Self Care | Payer: Medicare Other | Attending: Emergency Medicine | Admitting: Emergency Medicine

## 2016-03-15 DIAGNOSIS — Z853 Personal history of malignant neoplasm of breast: Secondary | ICD-10-CM | POA: Diagnosis not present

## 2016-03-15 DIAGNOSIS — Z79899 Other long term (current) drug therapy: Secondary | ICD-10-CM | POA: Insufficient documentation

## 2016-03-15 DIAGNOSIS — R52 Pain, unspecified: Secondary | ICD-10-CM

## 2016-03-15 DIAGNOSIS — Z87891 Personal history of nicotine dependence: Secondary | ICD-10-CM | POA: Diagnosis not present

## 2016-03-15 DIAGNOSIS — I480 Paroxysmal atrial fibrillation: Secondary | ICD-10-CM | POA: Diagnosis not present

## 2016-03-15 DIAGNOSIS — I482 Chronic atrial fibrillation, unspecified: Secondary | ICD-10-CM

## 2016-03-15 DIAGNOSIS — R531 Weakness: Secondary | ICD-10-CM | POA: Insufficient documentation

## 2016-03-15 DIAGNOSIS — I10 Essential (primary) hypertension: Secondary | ICD-10-CM | POA: Diagnosis not present

## 2016-03-15 DIAGNOSIS — R0789 Other chest pain: Secondary | ICD-10-CM | POA: Diagnosis present

## 2016-03-15 LAB — URINE MICROSCOPIC-ADD ON: RBC / HPF: NONE SEEN RBC/hpf (ref 0–5)

## 2016-03-15 LAB — URINALYSIS, ROUTINE W REFLEX MICROSCOPIC
GLUCOSE, UA: NEGATIVE mg/dL
Hgb urine dipstick: NEGATIVE
Ketones, ur: NEGATIVE mg/dL
Nitrite: POSITIVE — AB
PH: 5 (ref 5.0–8.0)
Protein, ur: 30 mg/dL — AB
Specific Gravity, Urine: 1.025 (ref 1.005–1.030)

## 2016-03-15 LAB — CBC WITH DIFFERENTIAL/PLATELET
BASOS PCT: 0 %
Basophils Absolute: 0 10*3/uL (ref 0.0–0.1)
EOS ABS: 0.1 10*3/uL (ref 0.0–0.7)
Eosinophils Relative: 2 %
HCT: 43.7 % (ref 36.0–46.0)
Hemoglobin: 14.4 g/dL (ref 12.0–15.0)
LYMPHS ABS: 1.3 10*3/uL (ref 0.7–4.0)
Lymphocytes Relative: 19 %
MCH: 29.2 pg (ref 26.0–34.0)
MCHC: 33 g/dL (ref 30.0–36.0)
MCV: 88.6 fL (ref 78.0–100.0)
Monocytes Absolute: 0.5 10*3/uL (ref 0.1–1.0)
Monocytes Relative: 7 %
Neutro Abs: 4.9 10*3/uL (ref 1.7–7.7)
Neutrophils Relative %: 72 %
Platelets: 147 10*3/uL — ABNORMAL LOW (ref 150–400)
RBC: 4.93 MIL/uL (ref 3.87–5.11)
RDW: 19.6 % — ABNORMAL HIGH (ref 11.5–15.5)
WBC: 6.8 10*3/uL (ref 4.0–10.5)

## 2016-03-15 LAB — COMPREHENSIVE METABOLIC PANEL
ALBUMIN: 2.9 g/dL — AB (ref 3.5–5.0)
ALK PHOS: 130 U/L — AB (ref 38–126)
ALT: 16 U/L (ref 14–54)
AST: 33 U/L (ref 15–41)
Anion gap: 9 (ref 5–15)
BUN: 29 mg/dL — ABNORMAL HIGH (ref 6–20)
CALCIUM: 8.6 mg/dL — AB (ref 8.9–10.3)
CO2: 25 mmol/L (ref 22–32)
Chloride: 106 mmol/L (ref 101–111)
Creatinine, Ser: 0.93 mg/dL (ref 0.44–1.00)
GFR calc Af Amer: 60 mL/min — ABNORMAL LOW (ref >60)
GFR calc non Af Amer: 51 mL/min — ABNORMAL LOW (ref >60)
GLUCOSE: 134 mg/dL — AB (ref 65–99)
Potassium: 3.7 mmol/L (ref 3.5–5.1)
Sodium: 140 mmol/L (ref 135–145)
Total Bilirubin: 2.5 mg/dL — ABNORMAL HIGH (ref 0.3–1.2)
Total Protein: 6.8 g/dL (ref 6.5–8.1)

## 2016-03-15 MED ORDER — SODIUM CHLORIDE 0.9 % IV SOLN
INTRAVENOUS | Status: DC
  Administered 2016-03-15: 16:00:00 via INTRAVENOUS

## 2016-03-15 MED ORDER — SODIUM CHLORIDE 0.9 % IV BOLUS (SEPSIS)
1000.0000 mL | Freq: Once | INTRAVENOUS | Status: AC
  Administered 2016-03-15: 1000 mL via INTRAVENOUS

## 2016-03-17 LAB — URINE CULTURE: Culture: NO GROWTH

## 2016-04-12 NOTE — ED Notes (Signed)
PT have been made aware of urine sample 

## 2016-04-12 NOTE — ED Notes (Signed)
Bed: [id number] Expected date:  Expected time:  Means of arrival:  Comments: 82 yo- fall

## 2016-04-12 NOTE — Discharge Instructions (Signed)
Follow-up with your primary care doctor this week and return here for any problems Fatigue Fatigue is feeling tired all of the time, a lack of energy, or a lack of motivation. Occasional or mild fatigue is often a normal response to activity or life in general. However, long-lasting (chronic) or extreme fatigue may indicate an underlying medical condition. HOME CARE INSTRUCTIONS  Watch your fatigue for any changes. The following actions may help to lessen any discomfort you are feeling:  Talk to your health care provider about how much sleep you need each night. Try to get the required amount every night.  Take medicines only as directed by your health care provider.  Eat a healthy and nutritious diet. Ask your health care provider if you need help changing your diet.  Drink enough fluid to keep your urine clear or pale yellow.  Practice ways of relaxing, such as yoga, meditation, massage therapy, or acupuncture.  Exercise regularly.   Change situations that cause you stress. Try to keep your work and personal routine reasonable.  Do not abuse illegal drugs.  Limit alcohol intake to no more than 1 drink per day for nonpregnant women and 2 drinks per day for men. One drink equals 12 ounces of beer, 5 ounces of wine, or 1 ounces of hard liquor.  Take a multivitamin, if directed by your health care provider. SEEK MEDICAL CARE IF:   Your fatigue does not get better.  You have a fever.   You have unintentional weight loss or gain.  You have headaches.   You have difficulty:   Falling asleep.  Sleeping throughout the night.  You feel angry, guilty, anxious, or sad.   You are unable to have a bowel movement (constipation).   You skin is dry.   Your legs or another part of your body is swollen.  SEEK IMMEDIATE MEDICAL CARE IF:   You feel confused.   Your vision is blurry.  You feel faint or pass out.   You have a severe headache.   You have severe  abdominal, pelvic, or back pain.   You have chest pain, shortness of breath, or an irregular or fast heartbeat.   You are unable to urinate or you urinate less than normal.   You develop abnormal bleeding, such as bleeding from the rectum, vagina, nose, lungs, or nipples.  You vomit blood.   You have thoughts about harming yourself or committing suicide.   You are worried that you might harm someone else.    This information is not intended to replace advice given to you by your health care provider. Make sure you discuss any questions you have with your health care provider.   Document Released: 06/26/2007 Document Revised: 09/19/2014 Document Reviewed: 12/31/2013 Elsevier Interactive Patient Education 2016 [employer] Weakness Weakness is a lack of strength. It may be felt all over the body (generalized) or in one specific part of the body (focal). Some causes of weakness can be serious. You may need further medical evaluation, especially if you are elderly or you have a history of immunosuppression (such as chemotherapy or HIV), kidney disease, heart disease, or diabetes. CAUSES  Weakness can be caused by many different things, including:  Infection.  Physical exhaustion.  Internal bleeding or other blood loss that results in a lack of red blood cells (anemia).  Dehydration. This cause is more common in elderly people.  Side effects or electrolyte abnormalities from medicines, such as pain medicines or sedatives.  Emotional distress,  anxiety, or depression.  Circulation problems, especially severe peripheral arterial disease.  Heart disease, such as rapid atrial fibrillation, bradycardia, or heart failure.  Nervous system disorders, such as Guillain-Barr syndrome, multiple sclerosis, or stroke. DIAGNOSIS  To find the cause of your weakness, your caregiver will take your history and perform a physical exam. Lab tests or X-rays may also be ordered, if  needed. TREATMENT  Treatment of weakness depends on the cause of your symptoms and can vary greatly. HOME CARE INSTRUCTIONS   Rest as needed.  Eat a well-balanced diet.  Try to get some exercise every day.  Only take over-the-counter or prescription medicines as directed by your caregiver. SEEK MEDICAL CARE IF:   Your weakness seems to be getting worse or spreads to other parts of your body.  You develop new aches or pains. SEEK IMMEDIATE MEDICAL CARE IF:   You cannot perform your normal daily activities, such as getting dressed and feeding yourself.  You cannot walk up and down stairs, or you feel exhausted when you do so.  You have shortness of breath or chest pain.  You have difficulty moving parts of your body.  You have weakness in only one area of the body or on only one side of the body.  You have a fever.  You have trouble speaking or swallowing.  You cannot control your bladder or bowel movements.  You have black or bloody vomit or stools. MAKE SURE YOU:  Understand these instructions.  Will watch your condition.  Will get help right away if you are not doing well or get worse.   This information is not intended to replace advice given to you by your health care provider. Make sure you discuss any questions you have with your health care provider.   Document Released: 08/29/2005 Document Revised: 02/28/2012 Document Reviewed: 10/28/2011 Elsevier Interactive Patient Education [employer].

## 2016-04-12 NOTE — ED Provider Notes (Signed)
CSN: [id number]     Arrival date & time April 01, 2016  1524 History   First MD Initiated Contact with Patient 2016-04-01 1529     Chief Complaint  Patient presents with  . Fall  . Back Pain     (Consider location/radiation/quality/duration/timing/severity/associated sxs/prior Treatment) HPI Comments: Patient here complaining of bilateral rib pain 3 weeks after she had mechanical fall. Since that time she has had increasing weakness. Denies any shortness of breath. Denies any weakness of her legs. No head or neck pain or trauma. No urinary symptoms. No fever or vomiting. Has had decreased oral intake. Pain in her ribs is characterized as sharp and worse with movement. She is probably not taking any medications for this discomfort. Her neighbor came today to check on her and she was so weak that she cannot get out of bed. States that she did see her primary care doctor week ago and was told to treat her symptoms symptomatically. Has a history of atrial fibrillation and is chronically on Eliquis. Denies any recent blood loss.  Patient is a 82 y.o. female presenting with fall and back pain. The history is provided by the patient.  Fall  Back Pain   Past Medical History  Diagnosis Date  . Hypertension   . Intertrochanteric fracture of right femur ([employer]) 09/21/2011  . Wears glasses   . Breast cancer ([place])     Right, s/p mastectomy  . PAF (paroxysmal atrial fibrillation) ([facility])     On chronic coumadin  . Cataract cortical, senile   . Osteoporosis    Past Surgical History  Procedure Laterality Date  . Femur im nail  09/21/2011    Procedure: INTRAMEDULLARY (IM) NAIL FEMORAL;  Surgeon: Johnny Bridge;  Location: [facility];  Service: Orthopedics;  Laterality: Right;  . Appendectomy    . Tonsillectomy    . Dilation and curettage of uterus    . Sigmoidoscopy    . Mastectomy Right 1981  . Breast lumpectomy with axillary lymph node biopsy Left 2006    left-Dr [facility]  . Open reduction internal fixation  (orif) metacarpal Left 12/30/2013    Procedure: OPEN REDUCTION INTERNAL FIXATION (ORIF) METACARPAL DISLOCATION AND COLLATERAL LIGAMENT REPAIR;  Surgeon: [name], MD;  Location: [place] [facility];  Service: Orthopedics;  Laterality: Left;  Open reduction left long finger metacarpophalangeal dislocation  . Orif humerus fracture Right 10/16/2014    Procedure: INTRAMEDULLARY NAIL PROXIMAL HUMERUS FRACTURE ;  Surgeon: Nita Sells, MD;  Location: [place];  Service: Orthopedics;  Laterality: Right;  Intramedullary nail vs open reduction internal fixation right proximal humerus fracture   Family History  Problem Relation Age of Onset  . Cancer      Neg hx  . Aneurysm Father   . Parkinson's disease Sister    Social History  Substance Use Topics  . Smoking status: Former Smoker    Types: Cigarettes    Quit date: 03/27/1980  . Smokeless tobacco: Never Used  . Alcohol Use: Yes     Comment: Occasional wine   OB History    No data available     Review of Systems  Musculoskeletal: Positive for back pain.  All other systems reviewed and are negative.     Allergies  Review of patient's allergies indicates no known allergies.  Home Medications   Prior to Admission medications   Medication Sig Start Date End Date Taking? Authorizing Provider  acetaminophen (TYLENOL) 325 MG tablet Take 2 tablets (650 mg total) by  mouth every 4 (four) hours as needed for headache or mild pain. Patient taking differently: Take 650 mg by mouth every 4 (four) hours as needed for headache or mild pain.  09/17/14   [name], MD  alendronate (FOSAMAX) 70 MG tablet Take 70 mg by mouth every Sunday. Take with a full glass of water on an empty stomach.    Historical Provider, MD  amLODipine (NORVASC) 2.5 MG tablet Take 2.5 mg by mouth every evening.    Historical Provider, MD  Calcium-Vitamin D (CALTRATE 600 PLUS-VIT D PO) Take 600 mg by mouth 2 (two) times daily.     Historical Provider,  MD  Cholecalciferol (MAXIMUM D3) 10000 UNITS CAPS Take 10,000 Units by mouth every Wednesday.    Historical Provider, MD  furosemide (LASIX) 20 MG tablet Take 2 tablets (40 mg total) by mouth daily as needed for edema (for pedal edema). Patient taking differently: Take 40 mg by mouth daily as needed for fluid or edema.  10/13/14   Debbe Odea, MD  losartan (COZAAR) 50 MG tablet Take 50 mg by mouth daily.    Historical Provider, MD  metoprolol succinate (TOPROL-XL) 50 MG 24 hr tablet Take 50 mg by mouth daily. Take with or immediately following a meal.    Historical Provider, MD  Multiple Vitamin (MULTIVITAMIN WITH MINERALS) TABS tablet Take 1 tablet by mouth daily. Centrum Silver    Historical Provider, MD  senna (SENOKOT) 8.6 MG TABS tablet Take 1 tablet (8.6 mg total) by mouth 2 (two) times daily. 03/29/14   Nishant Dhungel, MD  traMADol (ULTRAM) 50 MG tablet Take 1 tablet (50 mg total) by mouth every 6 (six) hours as needed. 10/17/14   Grier Mitts, PA-C  vitamin E 200 UNIT capsule Take 200 Units by mouth daily.     Historical Provider, MD  warfarin (COUMADIN) 2 MG tablet Take 0.5 tablets (1 mg total) by mouth daily. Patient not taking: Reported on 10/15/2014 10/13/14   Debbe Odea, MD  warfarin (COUMADIN) 2.5 MG tablet Take 1.25 mg by mouth every evening. 5pm at [employer]    Historical Provider, MD   BP 134/82 mmHg  Pulse 119  Temp(Src) 97.9 F (36.6 C) (Oral)  Resp 16  SpO2 95% Physical Exam  Constitutional: She is oriented to person, place, and time. She appears well-developed and well-nourished.  Non-toxic appearance. No distress.  HENT:  Head: Normocephalic and atraumatic.  Eyes: Conjunctivae, EOM and lids are normal. Pupils are equal, round, and reactive to light.  Neck: Normal range of motion. Neck supple. No tracheal deviation present. No thyroid mass present.  Cardiovascular: Normal heart sounds.  An irregularly irregular rhythm present. Tachycardia present.  Exam reveals no  gallop.   No murmur heard. Pulmonary/Chest: Effort normal and breath sounds normal. No stridor. No respiratory distress. She has no decreased breath sounds. She has no wheezes. She has no rhonchi. She has no rales. She exhibits bony tenderness. She exhibits no crepitus.    Abdominal: Soft. Normal appearance and bowel sounds are normal. She exhibits no distension. There is no tenderness. There is no rebound and no CVA tenderness.  Musculoskeletal: Normal range of motion. She exhibits no edema or tenderness.  Nontender to palpation along her cervical thoracic and lumbar spine.  Neurological: She is alert and oriented to person, place, and time. She has normal strength. No cranial nerve deficit or sensory deficit. GCS eye subscore is 4. GCS verbal subscore is 5. GCS motor subscore is 6.  Skin: Skin  is warm and dry. No abrasion and no rash noted.  Psychiatric: She has a normal mood and affect. Her speech is normal and behavior is normal.  Nursing note and vitals reviewed.   ED Course  Procedures (including critical care time) Labs Review Labs Reviewed  URINE CULTURE  CBC WITH DIFFERENTIAL/PLATELET  COMPREHENSIVE METABOLIC PANEL  URINALYSIS, ROUTINE W REFLEX MICROSCOPIC (NOT AT [facility])    Imaging Review No results found. I have personally reviewed and evaluated these images and lab results as part of my medical decision-making.   EKG Interpretation   Date/Time:  03/26/2016 15:45:20 EDT Ventricular Rate:  113 PR Interval:    QRS Duration: 81 QT Interval:  343 QTC Calculation: 471 R Axis:   110 Text Interpretation:  Atrial fibrillation Right axis deviation Borderline  repolarization abnormality rate is increased compared to prior Confirmed  by Nikesha Kwasny  MD, Petrea Fredenburg ([id number]) on 03/26/2016 4:17:53 PM      MDM   Final diagnoses:  Pain    Patient given IV fluids here 1 L of saline and heart rate has improved. She is in chronic atrial fibrillation. Had a long discussion with  her as well as her neighbor and have offered her consultation with social work and case management to look for a short-term rehabilitation facility. She has deferred at this time like to go home. I strongly encouraged her to follow-up with her Dr.    Lacretia Leigh, MD 03/26/2016 1726

## 2016-04-12 NOTE — ED Notes (Addendum)
Pt reports she had a fall 3 weeks ago. Pt got self up from fall afterwards. Since then has had gradual onset of worsening bilateral mid back pain and hip pain with movement.

## 2016-04-12 DEATH — deceased
